# Patient Record
Sex: Female | Born: 1935 | Race: White | Hispanic: No | Smoking: Former smoker
Health system: Southern US, Community
[De-identification: ages and names within clinical notes are randomized; demographics above are authoritative.]

## PROBLEM LIST (undated history)

## (undated) DIAGNOSIS — E785 Hyperlipidemia, unspecified: Secondary | ICD-10-CM

## (undated) DIAGNOSIS — K635 Polyp of colon: Secondary | ICD-10-CM

## (undated) DIAGNOSIS — K219 Gastro-esophageal reflux disease without esophagitis: Secondary | ICD-10-CM

## (undated) DIAGNOSIS — T7840XA Allergy, unspecified, initial encounter: Secondary | ICD-10-CM

## (undated) DIAGNOSIS — C801 Malignant (primary) neoplasm, unspecified: Secondary | ICD-10-CM

## (undated) DIAGNOSIS — I10 Essential (primary) hypertension: Secondary | ICD-10-CM

## (undated) DIAGNOSIS — R42 Dizziness and giddiness: Secondary | ICD-10-CM

## (undated) HISTORY — DX: Gastro-esophageal reflux disease without esophagitis: K21.9

## (undated) HISTORY — DX: Polyp of colon: K63.5

## (undated) HISTORY — PX: VAGINAL HYSTERECTOMY: SUR661

## (undated) HISTORY — PX: CHOLECYSTECTOMY: SHX55

## (undated) HISTORY — DX: Malignant (primary) neoplasm, unspecified: C80.1

## (undated) HISTORY — DX: Essential (primary) hypertension: I10

## (undated) HISTORY — DX: Hyperlipidemia, unspecified: E78.5

## (undated) HISTORY — PX: BLADDER REPAIR: SHX76

## (undated) HISTORY — DX: Allergy, unspecified, initial encounter: T78.40XA

## (undated) HISTORY — PX: APPENDECTOMY: SHX54

## (undated) HISTORY — PX: EYE SURGERY: SHX253

## (undated) HISTORY — DX: Dizziness and giddiness: R42

---

## 2000-09-11 LAB — HM DEXA SCAN

## 2004-03-22 ENCOUNTER — Other Ambulatory Visit: Payer: Self-pay

## 2005-02-24 ENCOUNTER — Ambulatory Visit: Payer: Self-pay | Admitting: Ophthalmology

## 2005-03-01 ENCOUNTER — Ambulatory Visit: Payer: Self-pay | Admitting: Ophthalmology

## 2005-03-24 ENCOUNTER — Ambulatory Visit: Payer: Self-pay | Admitting: Ophthalmology

## 2005-03-28 ENCOUNTER — Ambulatory Visit: Payer: Self-pay | Admitting: Ophthalmology

## 2005-05-09 ENCOUNTER — Ambulatory Visit: Payer: Self-pay | Admitting: Internal Medicine

## 2005-06-19 ENCOUNTER — Encounter: Payer: Self-pay | Admitting: Otolaryngology

## 2005-07-12 ENCOUNTER — Encounter: Payer: Self-pay | Admitting: Otolaryngology

## 2005-10-10 ENCOUNTER — Emergency Department: Payer: Self-pay | Admitting: Emergency Medicine

## 2005-11-10 ENCOUNTER — Ambulatory Visit: Payer: Self-pay | Admitting: Internal Medicine

## 2006-04-27 ENCOUNTER — Ambulatory Visit: Payer: Self-pay | Admitting: Unknown Physician Specialty

## 2006-05-24 ENCOUNTER — Ambulatory Visit: Payer: Self-pay | Admitting: Unknown Physician Specialty

## 2006-11-29 ENCOUNTER — Ambulatory Visit: Payer: Self-pay | Admitting: Surgery

## 2006-12-17 ENCOUNTER — Ambulatory Visit: Payer: Self-pay | Admitting: Surgery

## 2006-12-31 ENCOUNTER — Inpatient Hospital Stay: Payer: Self-pay | Admitting: Surgery

## 2007-01-10 HISTORY — PX: OTHER SURGICAL HISTORY: SHX169

## 2007-07-02 ENCOUNTER — Ambulatory Visit: Payer: Self-pay | Admitting: Unknown Physician Specialty

## 2007-11-20 ENCOUNTER — Ambulatory Visit: Payer: Self-pay | Admitting: Surgery

## 2007-11-28 ENCOUNTER — Ambulatory Visit: Payer: Self-pay | Admitting: Surgery

## 2008-05-13 ENCOUNTER — Encounter: Payer: Self-pay | Admitting: Internal Medicine

## 2008-06-11 ENCOUNTER — Encounter: Payer: Self-pay | Admitting: Internal Medicine

## 2008-10-08 ENCOUNTER — Ambulatory Visit: Payer: Self-pay | Admitting: Unknown Physician Specialty

## 2008-10-15 ENCOUNTER — Ambulatory Visit: Payer: Self-pay

## 2008-10-24 IMAGING — CT CT ABDOMEN W/ CM
2 of 4 series · 13 of 32 positions shown, 19 images · non-contrast
Comparison: none

REASON FOR EXAM: Recurrent Paraesophageal Hernia Reflux pain
COMMENTS:

PROCEDURE:     CT  - CT ABDOMEN STANDARD W  - November 28, 2007 [DATE]
RESULT:
HISTORY: Lung cancer, prior thoracotomy, prior cholecystectomy and
appendectomy.
COMPARISON STUDIES:  None.

[Series 2: abdomen · axial · 0.71mm/px · z∈[+200,+408]mm · 10 of 32 slices shown, 16 images (1 of 2)]
[im 3/32  soft-tissue]
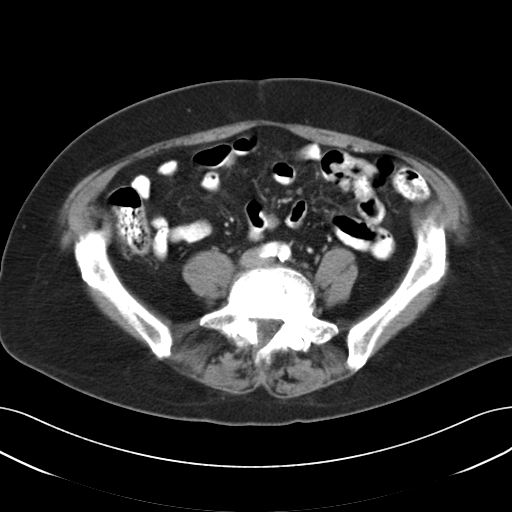
[im 3/32  bone]
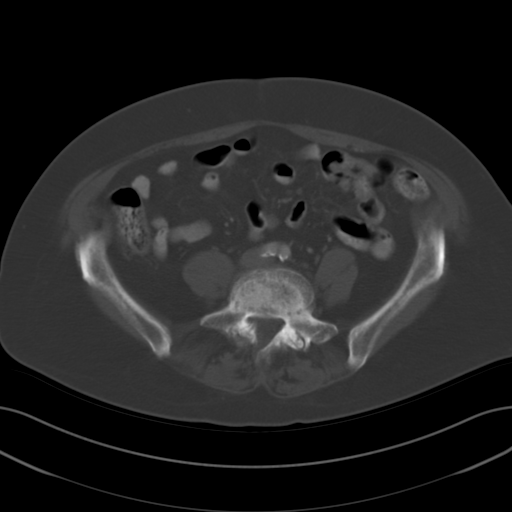
[im 6/32  soft-tissue]
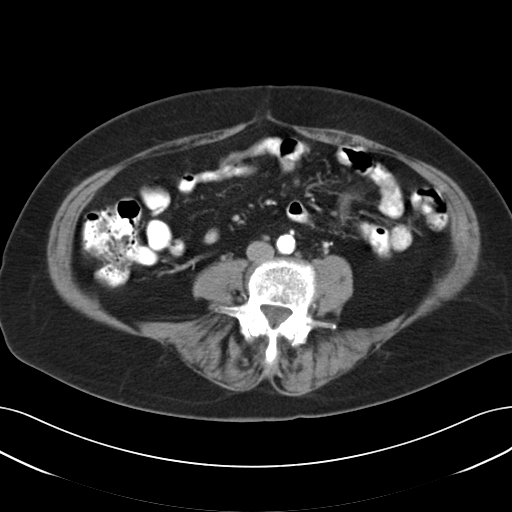
[im 9/32  soft-tissue]
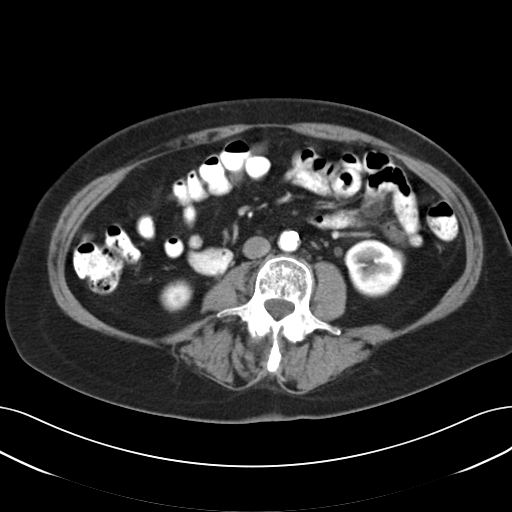
[im 12/32  soft-tissue]
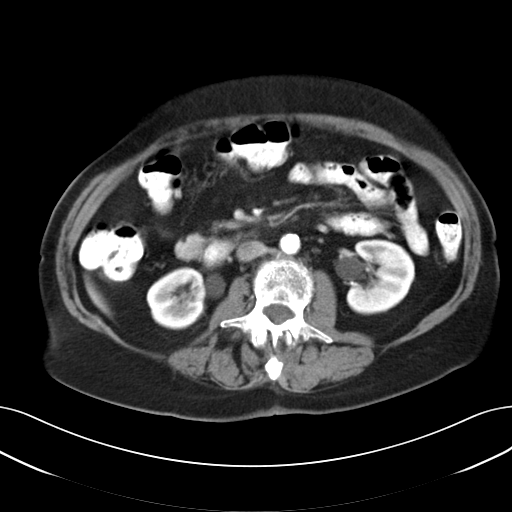
[im 15/32  soft-tissue]
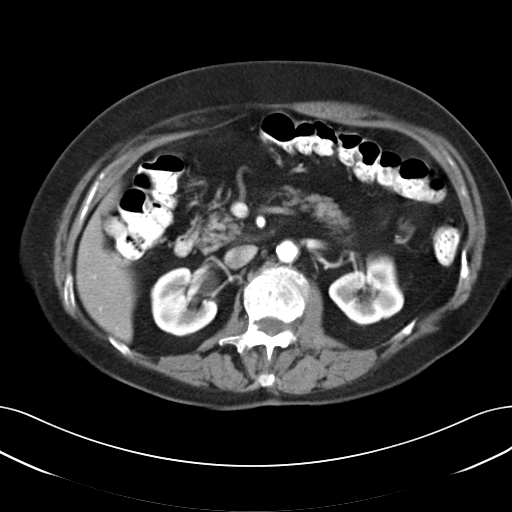
[im 17/32  soft-tissue]
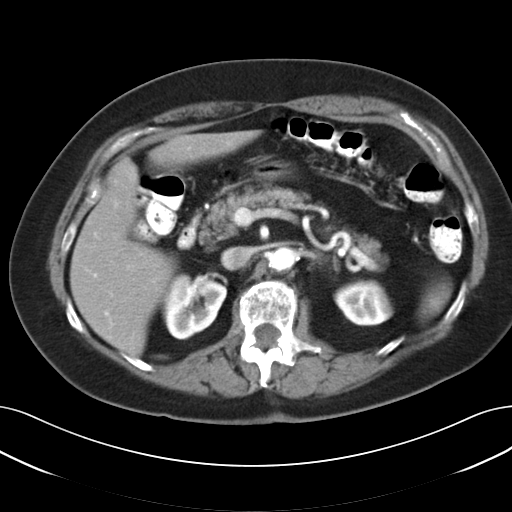
[im 20/32  soft-tissue]
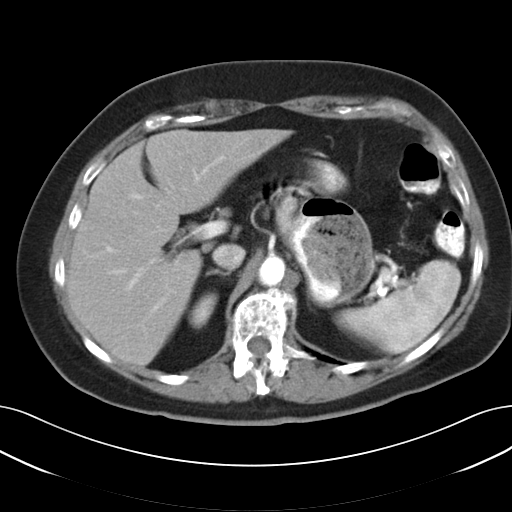
[im 20/32  lung]
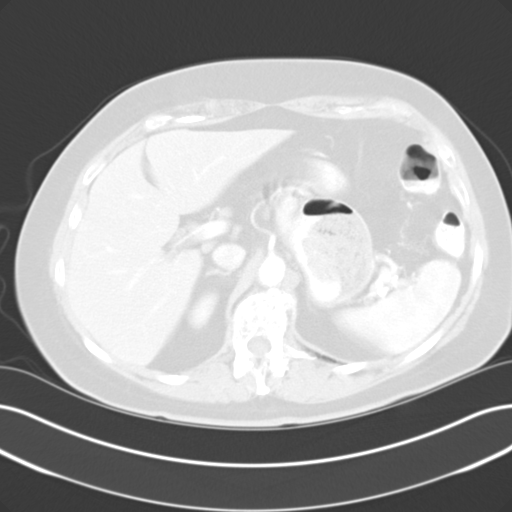
[im 23/32  soft-tissue]
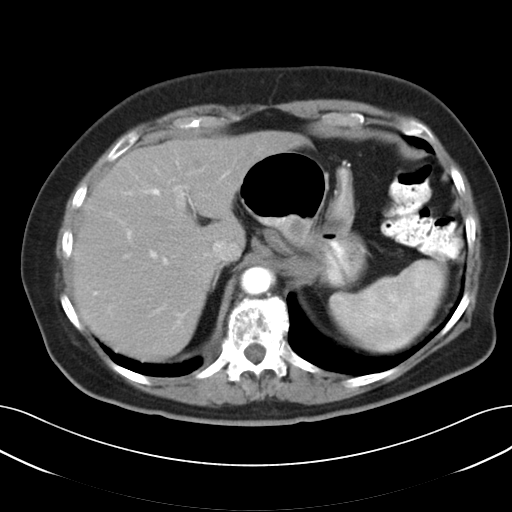
[im 23/32  lung]
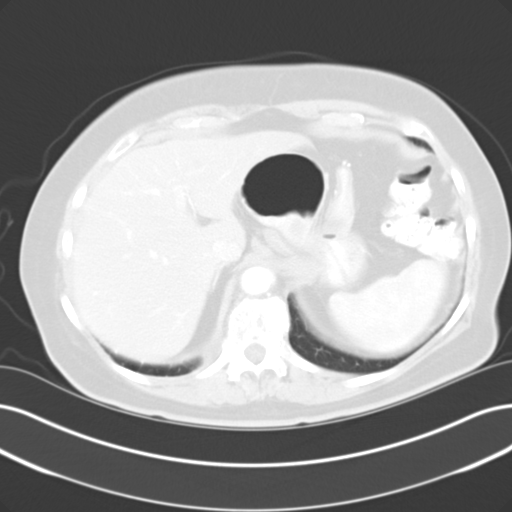
[im 26/32  soft-tissue]
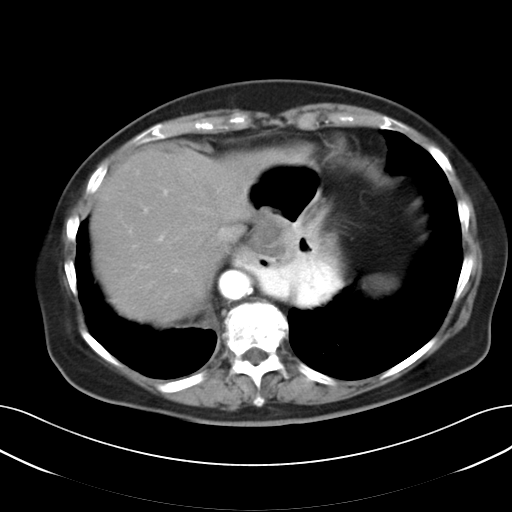
[im 26/32  lung]
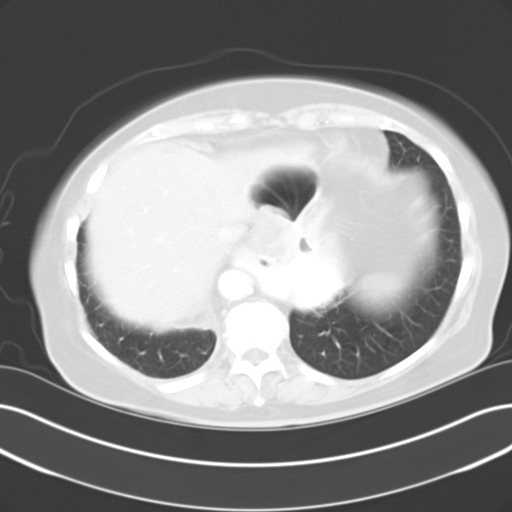
[im 26/32  bone]
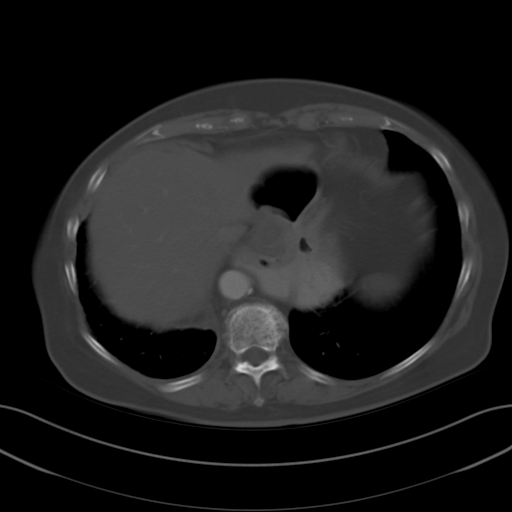
[im 29/32  soft-tissue]
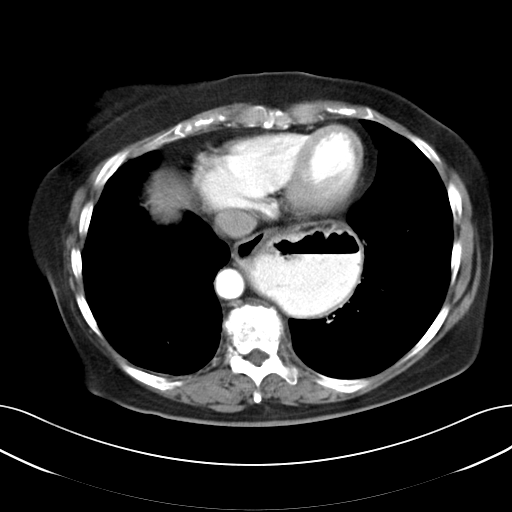
[im 29/32  lung]
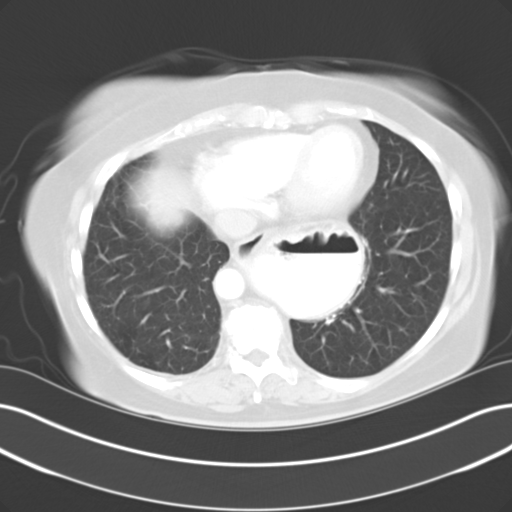

[Series 4: abdomen · axial · 0.71mm/px · z∈[+324,+372]mm · 3 of 23 slices shown (2 of 2)]
[im 4/23  soft-tissue]
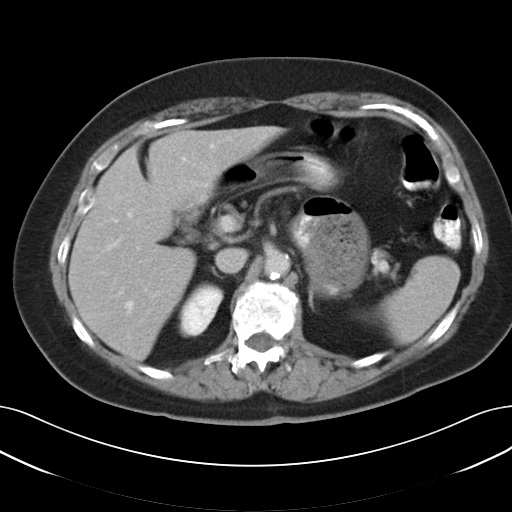
[im 7/23  soft-tissue]
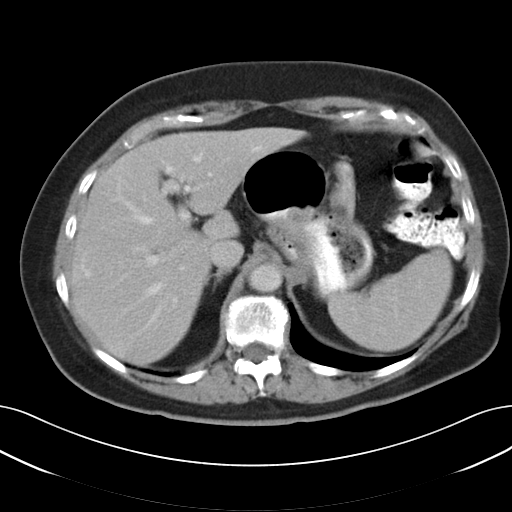
[im 10/23  soft-tissue]
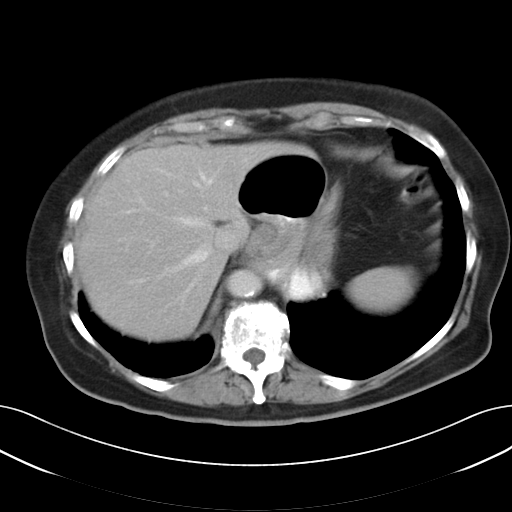

[13 of 32 positions shown; findings below may reference images not displayed]

FINDINGS: Standard CT of the abdomen and pelvis was obtained.  There is a
large sliding hiatal hernia present. The liver and spleen are unremarkable.
The hepatic veins and portal veins are patent. The pancreas is normal.  Mild
bilateral hydronephrosis versus extrarenal pelves is noted. A renal
ultrasound is suggested for further evaluation. Aortoiliac atherosclerotic
vascular disease is present. No bowel obstruction is noted.  No free air is
noted.  A large hiatal hernia is present. Reference is made to recent barium
swallow which demonstrated a large paraesophageal hiatal hernia.
IMPRESSION: 1.     Large hiatal hernia. Reference is made to recent barium swallow which
demonstrates a large paraesophageal hiatal hernia.
2.     Mild bilateral hydronephrosis versus extrarenal pelves.  Renal
ultrasound is suggested for further evaluation.

T

## 2008-11-16 ENCOUNTER — Ambulatory Visit: Payer: Self-pay | Admitting: Unknown Physician Specialty

## 2009-02-01 ENCOUNTER — Inpatient Hospital Stay (HOSPITAL_COMMUNITY): Admission: RE | Admit: 2009-02-01 | Discharge: 2009-02-04 | Payer: Self-pay | Admitting: Surgery

## 2009-02-01 ENCOUNTER — Encounter (INDEPENDENT_AMBULATORY_CARE_PROVIDER_SITE_OTHER): Payer: Self-pay | Admitting: Surgery

## 2009-08-10 ENCOUNTER — Ambulatory Visit: Payer: Self-pay

## 2009-11-15 ENCOUNTER — Ambulatory Visit: Payer: Self-pay | Admitting: General Practice

## 2009-12-15 ENCOUNTER — Ambulatory Visit: Payer: Self-pay | Admitting: Internal Medicine

## 2009-12-15 ENCOUNTER — Ambulatory Visit: Payer: Self-pay | Admitting: Unknown Physician Specialty

## 2010-01-24 ENCOUNTER — Ambulatory Visit: Payer: Self-pay | Admitting: General Practice

## 2010-02-09 ENCOUNTER — Inpatient Hospital Stay: Payer: Self-pay | Admitting: General Practice

## 2010-02-09 ENCOUNTER — Encounter: Payer: Self-pay | Admitting: Internal Medicine

## 2010-02-09 HISTORY — PX: JOINT REPLACEMENT: SHX530

## 2010-02-15 ENCOUNTER — Encounter: Payer: Self-pay | Admitting: Internal Medicine

## 2010-02-17 ENCOUNTER — Ambulatory Visit: Payer: Self-pay | Admitting: Internal Medicine

## 2010-02-18 DIAGNOSIS — J45909 Unspecified asthma, uncomplicated: Secondary | ICD-10-CM | POA: Insufficient documentation

## 2010-02-18 DIAGNOSIS — Z860100 Personal history of colon polyps, unspecified: Secondary | ICD-10-CM | POA: Insufficient documentation

## 2010-02-18 DIAGNOSIS — J309 Allergic rhinitis, unspecified: Secondary | ICD-10-CM | POA: Insufficient documentation

## 2010-02-18 DIAGNOSIS — E785 Hyperlipidemia, unspecified: Secondary | ICD-10-CM | POA: Insufficient documentation

## 2010-02-18 DIAGNOSIS — Z85118 Personal history of other malignant neoplasm of bronchus and lung: Secondary | ICD-10-CM | POA: Insufficient documentation

## 2010-02-18 DIAGNOSIS — N3941 Urge incontinence: Secondary | ICD-10-CM | POA: Insufficient documentation

## 2010-02-18 DIAGNOSIS — J4520 Mild intermittent asthma, uncomplicated: Secondary | ICD-10-CM | POA: Insufficient documentation

## 2010-02-18 DIAGNOSIS — I10 Essential (primary) hypertension: Secondary | ICD-10-CM | POA: Insufficient documentation

## 2010-02-18 DIAGNOSIS — K219 Gastro-esophageal reflux disease without esophagitis: Secondary | ICD-10-CM | POA: Insufficient documentation

## 2010-02-18 DIAGNOSIS — M810 Age-related osteoporosis without current pathological fracture: Secondary | ICD-10-CM | POA: Insufficient documentation

## 2010-02-21 ENCOUNTER — Encounter: Payer: Self-pay | Admitting: Internal Medicine

## 2010-03-10 ENCOUNTER — Ambulatory Visit: Payer: Self-pay | Admitting: Internal Medicine

## 2010-03-10 DIAGNOSIS — M1990 Unspecified osteoarthritis, unspecified site: Secondary | ICD-10-CM | POA: Insufficient documentation

## 2010-03-11 ENCOUNTER — Telehealth: Payer: Self-pay | Admitting: Internal Medicine

## 2010-06-30 ENCOUNTER — Ambulatory Visit: Payer: Self-pay | Admitting: Internal Medicine

## 2010-06-30 DIAGNOSIS — G479 Sleep disorder, unspecified: Secondary | ICD-10-CM

## 2010-07-13 ENCOUNTER — Ambulatory Visit: Payer: Self-pay | Admitting: Family Medicine

## 2010-07-13 ENCOUNTER — Encounter: Payer: Self-pay | Admitting: Family Medicine

## 2010-07-13 DIAGNOSIS — L723 Sebaceous cyst: Secondary | ICD-10-CM

## 2010-09-13 ENCOUNTER — Encounter: Payer: Self-pay | Admitting: Family Medicine

## 2010-09-20 ENCOUNTER — Encounter
Admission: RE | Admit: 2010-09-20 | Discharge: 2010-09-20 | Payer: Self-pay | Source: Home / Self Care | Attending: Orthopedic Surgery | Admitting: Orthopedic Surgery

## 2010-10-11 NOTE — Letter (Signed)
Summary: [facility] Admission,Med List,Problem [facility] Discharge Summar  [facility] Admission,Med List,Problem [facility] Discharge Summary   Imported By: Beau Fanny 02/21/2010 14:57:21  _____________________________________________________________________  External Attachment:    Type:   Image     Comment:   External Document

## 2010-10-11 NOTE — Assessment & Plan Note (Signed)
Summary: ROA FOR 4 MONTH FOLLOW-UP/JRR   Vital Signs:  Patient profile:   77 year old female Weight:      128 pounds Temp:     98.1 degrees F oral Pulse rate:   76 / minute Pulse rhythm:   regular BP sitting:   126 / 80  (left arm) Cuff size:   regular  Vitals Entered By: Mervin Hack CMA Duncan Dull) (June 30, 2010 2:35 PM) CC: 4 month follow-up   History of Present Illness: DOing well  Mostly back to normal living Now back to her course at [employer] and attending football games again Drives again Only needed the aides briefly  Now notes the problems in her right hip now--may need to consider having replacement of that hip now Uses 1 tylenol 500mg  in AM Only rarely uses aleve  No problems with esophagus of note Occ gets some symptoms and she is careful with diet and uses occ maalox swallowing okay if she is careful No longer totally lactose intolerant  Breathing has been okay has tried to cut back on symbicort---close to as needed now Has managed without wheezing or SOB  Having some sleep problems lately---occ just thinking about issues with kids, etc Occ awakens early in AM and can't get back to sleep. Tylenol PM no longer helping Not depressed  Doing well on avapro intolerant of a number of other meds wants to try losartan  has cyst on right cheek that is irritating her  Allergies: 1)  * Scopolamine  Past History:  Past medical, surgical, family and social histories (including risk factors) reviewed for relevance to current acute and chronic problems.  Past Medical History: Reviewed history from 03/10/2010 and no changes required. Hypertension Asthma Chronic Vertigo Allergic rhinitis Osteoporosis Colonic polyps, hx of Lung cancer, hx of Urinary incontinence Hyperlipidemia GERD Osteoarthritis  Past Surgical History: Reviewed history from 02/18/2010 and no changes required. Total hip replacement-02/2010 Esophageal Hernia-01/2007, repairs x  four Bladder repair Cataract extraction-Bilateral Hysterectomy Appendectomy Cholecystectomy  Family History: Reviewed history from 02/18/2010 and no changes required. Father died at age 105-Stroke or Pulmonary Embolism Mother died at in her 30's-Staph infection She is an only child  Social History: Reviewed history from 03/10/2010 and no changes required. Occupation: [employer] Retired Widow/Widower:2007 Children: Four sons, One died of brain tumor Alcohol use-yes, occassional wine  Son Barrister's clerk has health care POA. Would accept resuscitation attempts but no prologned ventilation. No feeding tube  Review of Systems       Occ headaches---seemed better after stopping zyrtec and irrigating her nose eating well weight up 4# again since post op No fever    Physical Exam  General:  alert and normal appearance.   Neck:  supple, no masses, no thyromegaly, no carotid bruits, and no cervical lymphadenopathy.   Lungs:  normal respiratory effort, no intercostal retractions, no accessory muscle use, and normal breath sounds.   Heart:  normal rate, regular rhythm, no murmur, and no gallop.   Abdomen:  soft and non-tender.   Extremities:  no edema Skin:  irritated cyst in right cheek Psych:  normally interactive, good eye contact, not anxious appearing, and not depressed appearing.     Impression & Recommendations:  Problem # 1:  SLEEP DISORDER (ICD-780.50) Assessment New will try trazodone as needed   Problem # 2:  HYPERTENSION (ICD-401.9) Assessment: Unchanged will try change to generic losartan  The following medications were removed from the medication list:    Avapro 300 Mg Tabs (  Irbesartan) .Marland Kitchen... Take one tablet by mouth every morning Her updated medication list for this problem includes:    Losartan Potassium 100 Mg Tabs (Losartan potassium) .Marland Kitchen... 1 tab daily for high blood pressure  BP today: 126/80 Prior BP: 156/78 (03/10/2010)  Problem # 3:   OSTEOARTHRITIS (ICD-715.90) Assessment: Improved left hip great now with trouble with right hip---holding off on surgery for now  Her updated medication list for this problem includes:    Acetaminophen 500 Mg Tabs (Acetaminophen) .Marland Kitchen... Take one tablet by mouth every four to six hours as needed for pain    Aspirin Ec 81 Mg Tbec (Aspirin) .Marland Kitchen... Take one tablet by mouth daily  Problem # 4:  ASTHMA (ICD-493.90) Assessment: Unchanged good control  Her updated medication list for this problem includes:    Symbicort 80-4.5 Mcg/act Aero (Budesonide-formoterol fumarate) .Marland Kitchen..Marland Kitchen Two puffs two times a day as needed    Theophylline Cr 200 Mg Xr12h-tab (Theophylline) .Marland Kitchen... Take 1 tablet by mouth once a day  Problem # 5:  GERD (ICD-530.81) Assessment: Unchanged good control  Her updated medication list for this problem includes:    Dexilant 60 Mg Cpdr (Dexlansoprazole) .Marland Kitchen... Take one tablet by mouth daily  Complete Medication List: 1)  Detrol La 4 Mg Xr24h-cap (Tolterodine tartrate) .... Take one by mouth at bedtime 2)  Lipitor 10 Mg Tabs (Atorvastatin calcium) .... Take one tablet by mouth at bedtime 3)  Symbicort 80-4.5 Mcg/act Aero (Budesonide-formoterol fumarate) .... Two puffs two times a day as needed 4)  Nasonex 50 Mcg/act Susp (Mometasone furoate) .... Two sprays each nostril two times a day as needed 5)  Dexilant 60 Mg Cpdr (Dexlansoprazole) .... Take one tablet by mouth daily 6)  Ondansetron Hcl 4 Mg Tabs (Ondansetron hcl) .... Dissolve one talet under the tongue as needed 7)  Theophylline Cr 200 Mg Xr12h-tab (Theophylline) .... Take 1 tablet by mouth once a day 8)  Acetaminophen 500 Mg Tabs (Acetaminophen) .... Take one tablet by mouth every four to six hours as needed for pain 9)  Calcium Citrate + 315-200 Mg-unit Tabs (Calcium citrate-vitamin d) .... Take one tablet by mouth two times a day 10)  Multivitamins Tabs (Multiple vitamin) .... Take one tablet by mouth daily 11)  Saline  Nasal Spray 0.65 % Soln (Saline) .... One spray each nostril every two hours as needed 12)  Zyrtec Allergy 10 Mg Tabs (Cetirizine hcl) .... Take one tablet by mouth at bedtime 13)  Miralax Powd (Polyethylene glycol 3350) .Marland KitchenMarland KitchenMarland Kitchen 17 grams by mouth everyday as needed 14)  Aspirin Ec 81 Mg Tbec (Aspirin) .... Take one tablet by mouth daily 15)  Losartan Potassium 100 Mg Tabs (Losartan potassium) .Marland Kitchen.. 1 tab daily for high blood pressure 16)  Trazodone Hcl 50 Mg Tabs (Trazodone hcl) .Marland Kitchen.. 1-2 tabs by mouth at bedtime as needed to help sleep  Other Orders: Flu Vaccine 21yrs + MEDICARE PATIENTS (Z6109) Administration Flu vaccine - MCR (U0454)  Patient Instructions: 1)  Please schedule 30 minute surgery visit to remove cyst on face Prescriptions: TRAZODONE HCL 50 MG TABS (TRAZODONE HCL) 1-2 tabs by mouth at bedtime as needed to help sleep  #60 x 5   Entered and Authorized by:   Cindee Salt MD   Signed by:   Cindee Salt MD on 06/30/2010   Method used:   Electronically to        [facility]* (retail)       [address]  [place], [place]  [address]       Ph: [phone]       Fax: [phone]   RxID:   [id number] LOSARTAN POTASSIUM 100 MG TABS (LOSARTAN POTASSIUM) 1 tab daily for high blood pressure  #30 x 11   Entered and Authorized by:   Cindee Salt MD   Signed by:   Cindee Salt MD on 06/30/2010   Method used:   Electronically to        [facility]* (retail)       [address]       [place], [place]  [address]       Ph: [phone]       Fax: [phone]   RxID:   [id number]    Orders Added: 1)  Flu Vaccine 26yrs + MEDICARE PATIENTS [Q2039] 2)  Administration Flu vaccine - MCR [G0008] 3)  Est. Patient Level IV [06301]   Immunizations Administered:  Influenza Vaccine # 1:    Vaccine Type: Fluvax MCR    Site: left deltoid    Mfr: Sanofi Pasteur    Dose: 0.5 ml    Route: IM    Given by: Mervin Hack CMA (AAMA)    Exp. Date:  03/11/2011    Lot #: [id number]    VIS given: 04/05/10 version given June 30, 2010.  Flu Vaccine Consent Questions:    Do you have a history of severe allergic reactions to this vaccine? no    Any prior history of allergic reactions to egg and/or gelatin? no    Do you have a sensitivity to the preservative Thimersol? no    Do you have a past history of Guillan-Barre Syndrome? no    Do you currently have an acute febrile illness? no    Have you ever had a severe reaction to latex? no    Vaccine information given and explained to patient? yes    Are you currently pregnant? no   Immunizations Administered:  Influenza Vaccine # 1:    Vaccine Type: Fluvax MCR    Site: left deltoid    Mfr: Sanofi Pasteur    Dose: 0.5 ml    Route: IM    Given by: Mervin Hack CMA (AAMA)    Exp. Date: 03/11/2011    Lot #: [id number]    VIS given: 04/05/10 version given June 30, 2010.  Current Allergies (reviewed today): * SCOPOLAMINE

## 2010-10-11 NOTE — Assessment & Plan Note (Signed)
Summary: [facility] Office Visit   Vital Signs:  Patient profile:   77 year old female Height:      60 inches Weight:      128 pounds  History of Present Illness: 77 yo new to me here to discuss several issues.  1.  Sebaceous cyst on right cheek- had an appointment with Dr. Alphonsus Sias to remove it, she would like to cancel the appointment as it continues to get smaller and smaller.  No longer bothering her.  2.  Insomnia- was given Trazadone at last apppointment.  Took one 50 mg tablet and made her very dizzy and nausea.  Has h/o vertigo. Stopped taking it.  Would like to try low dose ambien to "help slow her mind down at night." OVerall, mood is very good. Happy to be turning 77 yo next month next month.  Very active and misses her husband but knows he is in a better place- strong in her faith.  3.  Hip pain- s/p left hip replacement, scheduling appt for right hip replacement soon. With change of weather, taking more alleve with her tylenol.  No more often than twice a week due to her stomach and esophageal issues.  Current Medications (verified): 1)  Detrol La 4 Mg Xr24h-Cap (Tolterodine Tartrate) .... Take One By Mouth At Bedtime 2)  Lipitor 10 Mg Tabs (Atorvastatin Calcium) .... Take One Tablet By Mouth At Bedtime 3)  Symbicort 80-4.5 Mcg/act Aero (Budesonide-Formoterol Fumarate) .... Two Puffs Two Times A Day As Needed 4)  Nasonex 50 Mcg/act Susp (Mometasone Furoate) .... Two Sprays Each Nostril Two Times A Day As Needed 5)  Dexilant 60 Mg Cpdr (Dexlansoprazole) .... Take One Tablet By Mouth Daily 6)  Ondansetron Hcl 4 Mg Tabs (Ondansetron Hcl) .... Dissolve One Talet Under The Tongue As Needed 7)  Theophylline Cr 200 Mg Xr12h-Tab (Theophylline) .... Take 1 Tablet By Mouth Once A Day 8)  Acetaminophen 500 Mg Tabs (Acetaminophen) .... Take One Tablet By Mouth Every Four To Six Hours As Needed For Pain 9)  Calcium Citrate + 315-200 Mg-Unit Tabs (Calcium Citrate-Vitamin D) .... Take One Tablet  By Mouth Two Times A Day 10)  Multivitamins  Tabs (Multiple Vitamin) .... Take One Tablet By Mouth Daily 11)  Saline Nasal Spray 0.65 % Soln (Saline) .... One Spray Each Nostril Every Two Hours As Needed 12)  Zyrtec Allergy 10 Mg Tabs (Cetirizine Hcl) .... Take One Tablet By Mouth At Bedtime 13)  Miralax  Powd (Polyethylene Glycol 3350) .Marland KitchenMarland KitchenMarland Kitchen 17 Grams By Mouth Everyday As Needed 14)  Aspirin Ec 81 Mg Tbec (Aspirin) .... Take One Tablet By Mouth Daily 15)  Losartan Potassium 100 Mg Tabs (Losartan Potassium) .Marland Kitchen.. 1 Tab Daily For High Blood Pressure 16)  Ambien 5 Mg Tabs (Zolpidem Tartrate) .Marland Kitchen.. 1 Tab By Mouth At Bedtime As Needed Insomnia. 17)  Naprosyn 500 Mg Tabs (Naproxen) .Marland Kitchen.. 1 Two Times A Day Pc Prn  Allergies (verified): 1)  * Scopolamine  Past History:  Past Medical History: Last updated: 03/10/2010 Hypertension Asthma Chronic Vertigo Allergic rhinitis Osteoporosis Colonic polyps, hx of Lung cancer, hx of Urinary incontinence Hyperlipidemia GERD Osteoarthritis  Past Surgical History: Last updated: 03/16/2010 Total hip replacement-02/2010 Esophageal Hernia-01/2007, repairs x four Bladder repair Cataract extraction-Bilateral Hysterectomy Appendectomy Cholecystectomy  Family History: Last updated: 03-16-10 Father died at age 75-Stroke or Pulmonary Embolism Mother died at in her 82's-Staph infection She is an only child  Social History: Last updated: 03/10/2010 Occupation: [employer] Retired Widow/Widower:2007 Children: Four  sons, One died of brain tumor Alcohol use-yes, occassional wine  Son Alto Denver Roberson has health care POA. Would accept resuscitation attempts but no prologned ventilation. No feeding tube  Review of Systems      See HPI General:  Denies fever. Resp:  Denies cough. Psych:  Denies anxiety and depression.  Physical Exam  General:  alert and normal appearance.   Mouth:  MMM Lungs:  normal respiratory effort, no intercostal  retractions, no accessory muscle use, and normal breath sounds.   Heart:  normal rate, regular rhythm, no murmur, and no gallop.   Skin:  small cyst on right cheek, no erythema, nontender to palp Psych:  normally interactive, good eye contact, not anxious appearing, and not depressed appearing.     Impression & Recommendations:  Problem # 1:  SEBACEOUS CYST (ICD-706.2) Assessment Improved cancelled appt with Dr. Alphonsus Sias to remove it.  Problem # 2:  SLEEP DISORDER (ICD-780.50) Assessment: Deteriorated d/c trazadone start very low dose ambien- discussed possible side effects.  Problem # 3:  OSTEOARTHRITIS (ICD-715.90) Assessment: Deteriorated hopes to have hip replacement in the next month or so. Continue Tylenol and Alleve as needed. Her updated medication list for this problem includes:    Acetaminophen 500 Mg Tabs (Acetaminophen) .Marland Kitchen... Take one tablet by mouth every four to six hours as needed for pain    Aspirin Ec 81 Mg Tbec (Aspirin) .Marland Kitchen... Take one tablet by mouth daily    Naprosyn 500 Mg Tabs (Naproxen) .Marland Kitchen... 1 two times a day pc prn  Complete Medication List: 1)  Detrol La 4 Mg Xr24h-cap (Tolterodine tartrate) .... Take one by mouth at bedtime 2)  Lipitor 10 Mg Tabs (Atorvastatin calcium) .... Take one tablet by mouth at bedtime 3)  Symbicort 80-4.5 Mcg/act Aero (Budesonide-formoterol fumarate) .... Two puffs two times a day as needed 4)  Nasonex 50 Mcg/act Susp (Mometasone furoate) .... Two sprays each nostril two times a day as needed 5)  Dexilant 60 Mg Cpdr (Dexlansoprazole) .... Take one tablet by mouth daily 6)  Ondansetron Hcl 4 Mg Tabs (Ondansetron hcl) .... Dissolve one talet under the tongue as needed 7)  Theophylline Cr 200 Mg Xr12h-tab (Theophylline) .... Take 1 tablet by mouth once a day 8)  Acetaminophen 500 Mg Tabs (Acetaminophen) .... Take one tablet by mouth every four to six hours as needed for pain 9)  Calcium Citrate + 315-200 Mg-unit Tabs (Calcium  citrate-vitamin d) .... Take one tablet by mouth two times a day 10)  Multivitamins Tabs (Multiple vitamin) .... Take one tablet by mouth daily 11)  Saline Nasal Spray 0.65 % Soln (Saline) .... One spray each nostril every two hours as needed 12)  Zyrtec Allergy 10 Mg Tabs (Cetirizine hcl) .... Take one tablet by mouth at bedtime 13)  Miralax Powd (Polyethylene glycol 3350) .Marland KitchenMarland KitchenMarland Kitchen 17 grams by mouth everyday as needed 14)  Aspirin Ec 81 Mg Tbec (Aspirin) .... Take one tablet by mouth daily 15)  Losartan Potassium 100 Mg Tabs (Losartan potassium) .Marland Kitchen.. 1 tab daily for high blood pressure 16)  Ambien 5 Mg Tabs (Zolpidem tartrate) .Marland Kitchen.. 1 tab by mouth at bedtime as needed insomnia. 17)  Naprosyn 500 Mg Tabs (Naproxen) .Marland Kitchen.. 1 two times a day pc prn Prescriptions: AMBIEN 5 MG TABS (ZOLPIDEM TARTRATE) 1 tab by mouth at bedtime as needed insomnia.  #30 x 0   Entered and Authorized by:   Ruthe Mannan MD   Signed by:   Ruthe Mannan MD on 07/13/2010  Method used:   Telephoned to ...       [facility]* (retail)       [address]       [place], [place]  [address]       Ph: [phone]       Fax: [phone]   RxID:   [id number]

## 2010-10-11 NOTE — Progress Notes (Signed)
Summary: regarding detrol  Phone Note From Pharmacy   Caller: [facility] Summary of Call: Script was sent in for detrol LA 2 mg, but pharmacy says pt has always been on 4 mg's.  They are asking if you are changing the dose.  Note is on your desk. Initial call taken by: Lowella Petties CMA,  March 11, 2010 11:04 AM  Follow-up for Phone Call        please change the Rx to 4mg  I had her listed as 2mg  but am not changing her usual dose Follow-up by: Cindee Salt MD,  March 11, 2010 11:43 AM  Additional Follow-up for Phone Call Additional follow up Details #1::        Called change to [facility], changed dose in emr. Additional Follow-up by: Lowella Petties CMA,  March 11, 2010 12:12 PM    New/Updated Medications: DETROL LA 4 MG XR24H-CAP (TOLTERODINE TARTRATE) take one by mouth at bedtime  Prior Medications: AVAPRO 300 MG TABS (IRBESARTAN) take one tablet by mouth every morning ACETAMINOPHEN 500 MG TABS (ACETAMINOPHEN) take one tablet by mouth every four to six hours as needed for pain CALCIUM CITRATE + 315-200 MG-UNIT TABS (CALCIUM CITRATE-VITAMIN D) take one tablet by mouth two times a day DETROL LA 4 MG XR24H-CAP (TOLTERODINE TARTRATE) take one by mouth at bedtime LIPITOR 10 MG TABS (ATORVASTATIN CALCIUM) take one tablet by mouth at bedtime MULTIVITAMINS  TABS (MULTIPLE VITAMIN) take one tablet by mouth daily SYMBICORT 80-4.5 MCG/ACT AERO (BUDESONIDE-FORMOTEROL FUMARATE) two puffs two times a day as needed SALINE NASAL SPRAY 0.65 % SOLN (SALINE) one spray each nostril every two hours as needed NASONEX 50 MCG/ACT SUSP (MOMETASONE FUROATE) two sprays each nostril two times a day as needed MIRALAX  POWD (POLYETHYLENE GLYCOL 3350) 17 grams by mouth everyday as needed ZYRTEC ALLERGY 10 MG TABS (CETIRIZINE HCL) take one tablet by mouth at bedtime DEXILANT 60 MG CPDR (DEXLANSOPRAZOLE) take one tablet by mouth daily ASPIRIN EC 81 MG TBEC (ASPIRIN) take one tablet by mouth daily ONDANSETRON HCL  4 MG TABS (ONDANSETRON HCL) dissolve one talet under the tongue as needed THEOPHYLLINE CR 200 MG XR12H-TAB (THEOPHYLLINE) Take 1 tablet by mouth once a day Current Allergies: * SCOPOLAMINE

## 2010-10-11 NOTE — Letter (Signed)
Summary: [facility] Face Sheet  [facility] Face Sheet   Imported By: Beau Fanny 02/17/2010 14:43:21  _____________________________________________________________________  External Attachment:    Type:   Image     Comment:   External Document

## 2010-10-11 NOTE — Assessment & Plan Note (Signed)
Summary: follow up per dr. Alphonsus Sias   Vital Signs:  Patient profile:   77 year old female Height:      60 inches Weight:      123.25 pounds BMI:     24.16 Temp:     98 degrees F Pulse rate:   84 / minute BP sitting:   156 / 78  (left arm) Cuff size:   regular  Vitals Entered By: Lewanda Rife LPN (March 10, 2010 4:13 PM) CC: follow-up visit   History of Present Illness: Here for first follow up I saw her first in [place] when she was there for rehab following hip replacement  Has done well back at home Really had had enough of being in hospital and rehab did leave fairly quickly but was walking and independent in ADLs Has had aide for 2 hours per day Daughter checking on her regularly Felt better with in 48 hours---felt that her swallowing and asthma had worsened in health care and this did improve back at home  Breathing is okay no cough  swallowing is back to normal eating fine now  Pain in hip is better got off narcotics, then toradol, then tylenol--now not taking anything has finished with OT still getting help from PT Now walking with cane able to drive again has folllow up with Dr Ernest Pine in 2 weeks Still uses aleve two times a day to allow her to do her aggressive PT home regimen  Allergies: 1)  * Scopolamine  Past History:  Past medical, surgical, family and social histories (including risk factors) reviewed for relevance to current acute and chronic problems.  Past Medical History: Hypertension Asthma Chronic Vertigo Allergic rhinitis Osteoporosis Colonic polyps, hx of Lung cancer, hx of Urinary incontinence Hyperlipidemia GERD Osteoarthritis  Past Surgical History: Reviewed history from 02/18/2010 and no changes required. Total hip replacement-02/2010 Esophageal Hernia-01/2007, repairs x four Bladder repair Cataract extraction-Bilateral Hysterectomy Appendectomy Cholecystectomy  Family History: Reviewed history from 02/18/2010 and no  changes required. Father died at age 69-Stroke or Pulmonary Embolism Mother died at in her 61's-Staph infection She is an only child  Social History: Occupation: Advertising account planner Retired Widow/Widower:2007 Children: Four sons, One died of brain tumor Alcohol use-yes, occassional wine  Son Barrister's clerk has health care POA. Would accept resuscitation attempts but no prologned ventilation. No feeding tube  Review of Systems       weight is reasonably stable from before the surgery sleeping is still off somewhat---awakens at 4-5AM but tries to stay in bed  Physical Exam  General:  alert and normal appearance.   Neck:  supple, no masses, no thyromegaly, and no cervical lymphadenopathy.   Lungs:  normal respiratory effort and normal breath sounds.   Heart:  normal rate, regular rhythm, no murmur, and no gallop.   Abdomen:  soft and non-tender.   Extremities:  no edema Neurologic:  walks well with or without the cane Psych:  normally interactive, good eye contact, not anxious appearing, and not depressed appearing.     Impression & Recommendations:  Problem # 1:  OSTEOARTHRITIS (ICD-715.90) Assessment Comment Only has rehabbed weill from [facility] uses aleve and her exercises aggressively  The following medications were removed from the medication list:    Tramadol Hcl 50 Mg Tabs (Tramadol hcl) .Marland Kitchen... Take one tablet every four to six hours as needed for pain    Oxycodone-acetaminophen 5-500 Mg Caps (Oxycodone-acetaminophen) .Marland Kitchen... Take one tablet by mouth every four to six hours as needed for  pain Her updated medication list for this problem includes:    Acetaminophen 500 Mg Tabs (Acetaminophen) .Marland Kitchen... Take one tablet by mouth every four to six hours as needed for pain    Aspirin Ec 81 Mg Tbec (Aspirin) .Marland Kitchen... Take one tablet by mouth daily  Problem # 2:  GERD (ICD-530.81) Assessment: Improved swallowing is better now since going home  Her updated medication list for this problem  includes:    Dexilant 60 Mg Cpdr (Dexlansoprazole) .Marland Kitchen... Take one tablet by mouth daily  Problem # 3:  ASTHMA (ICD-493.90) Assessment: Unchanged stable resp status  Her updated medication list for this problem includes:    Symbicort 80-4.5 Mcg/act Aero (Budesonide-formoterol fumarate) .Marland Kitchen..Marland Kitchen Two puffs two times a day as needed    Theophylline Cr 200 Mg Xr12h-tab (Theophylline) .Marland Kitchen... Take 1 tablet by mouth once a day  Problem # 4:  HYPERTENSION (ICD-401.9) Assessment: Unchanged reasonable control no changes needed  Her updated medication list for this problem includes:    Avapro 300 Mg Tabs (Irbesartan) .Marland Kitchen... Take one tablet by mouth every morning  BP today: 156/78  Complete Medication List: 1)  Avapro 300 Mg Tabs (Irbesartan) .... Take one tablet by mouth every morning 2)  Acetaminophen 500 Mg Tabs (Acetaminophen) .... Take one tablet by mouth every four to six hours as needed for pain 3)  Calcium Citrate + 315-200 Mg-unit Tabs (Calcium citrate-vitamin d) .... Take one tablet by mouth two times a day 4)  Detrol La 2 Mg Xr24h-cap (Tolterodine tartrate) .... Take one tablet by mouth at bedtime 5)  Lipitor 10 Mg Tabs (Atorvastatin calcium) .... Take one tablet by mouth at bedtime 6)  Multivitamins Tabs (Multiple vitamin) .... Take one tablet by mouth daily 7)  Symbicort 80-4.5 Mcg/act Aero (Budesonide-formoterol fumarate) .... Two puffs two times a day as needed 8)  Saline Nasal Spray 0.65 % Soln (Saline) .... One spray each nostril every two hours as needed 9)  Nasonex 50 Mcg/act Susp (Mometasone furoate) .... Two sprays each nostril two times a day as needed 10)  Miralax Powd (Polyethylene glycol 3350) .Marland KitchenMarland KitchenMarland Kitchen 17 grams by mouth everyday as needed 11)  Zyrtec Allergy 10 Mg Tabs (Cetirizine hcl) .... Take one tablet by mouth at bedtime 12)  Dexilant 60 Mg Cpdr (Dexlansoprazole) .... Take one tablet by mouth daily 13)  Aspirin Ec 81 Mg Tbec (Aspirin) .... Take one tablet by mouth daily 14)   Ondansetron Hcl 4 Mg Tabs (Ondansetron hcl) .... Dissolve one talet under the tongue as needed 15)  Theophylline Cr 200 Mg Xr12h-tab (Theophylline) .... Take 1 tablet by mouth once a day  Patient Instructions: 1)  Please schedule a follow-up appointment in 4 months .  Prescriptions: ZYRTEC ALLERGY 10 MG TABS (CETIRIZINE HCL) take one tablet by mouth at bedtime  #30 x 12   Entered and Authorized by:   Cindee Salt MD   Signed by:   Cindee Salt MD on 03/10/2010   Method used:   Electronically to        [facility]* (retail)       [address]       [place], [place]  [address]       Ph: [phone]       Fax: [phone]   RxID:   [id number] THEOPHYLLINE CR 200 MG XR12H-TAB (THEOPHYLLINE) Take 1 tablet by mouth once a day  #30 x 12   Entered and Authorized by:   Cindee Salt MD   Signed by:  Cindee Salt MD on 03/10/2010   Method used:   Electronically to        [facility]* (retail)       [address]       [place], [place]  [address]       Ph: [phone]       Fax: [phone]   RxID:   [id number] ONDANSETRON HCL 4 MG TABS (ONDANSETRON HCL) dissolve one talet under the tongue as needed  #30 x 3   Entered and Authorized by:   Cindee Salt MD   Signed by:   Cindee Salt MD on 03/10/2010   Method used:   Electronically to        [facility]* (retail)       [address]       [place], [place]  [address]       Ph: [phone]       Fax: [phone]   RxID:   [id number] DEXILANT 60 MG CPDR (DEXLANSOPRAZOLE) take one tablet by mouth daily  #30 x 12   Entered and Authorized by:   Cindee Salt MD   Signed by:   Cindee Salt MD on 03/10/2010   Method used:   Electronically to        [facility]* (retail)       [address]       [place], [place]  [address]       Ph: [phone]       Fax: [phone]   RxID:   [id number] NASONEX 50 MCG/ACT SUSP (MOMETASONE FUROATE) two sprays each nostril two  times a day as needed  #1 x 12   Entered and Authorized by:   Cindee Salt MD   Signed by:   Cindee Salt MD on 03/10/2010   Method used:   Electronically to        [facility]* (retail)       [address]       [place], [place]  [address]       Ph: [phone]       Fax: [phone]   RxID:   [id number] SYMBICORT 80-4.5 MCG/ACT AERO (BUDESONIDE-FORMOTEROL FUMARATE) two puffs two times a day as needed  #1 x 12   Entered and Authorized by:   Cindee Salt MD   Signed by:   Cindee Salt MD on 03/10/2010   Method used:   Electronically to        [facility]* (retail)       [address]       [place], [place]  [address]       Ph: [phone]       Fax: [phone]   RxID:   [id number] LIPITOR 10 MG TABS (ATORVASTATIN CALCIUM) take one tablet by mouth at bedtime  #30 x 12   Entered and Authorized by:   Cindee Salt MD   Signed by:   Cindee Salt MD on 03/10/2010   Method used:   Electronically to        [facility]* (retail)       [address]       [place], [place]  [address]       Ph: [phone]       Fax: [phone]   RxID:   [id number] DETROL LA 2 MG XR24H-CAP (TOLTERODINE TARTRATE) take one tablet by mouth at bedtime  #30 x 12   Entered and Authorized by:   Cindee Salt MD  Signed by:   Cindee Salt MD on 03/10/2010   Method used:   Electronically to        [facility]* (retail)       [address]       [place], [place]  [address]       Ph: [phone]       Fax: [phone]   RxID:   [id number] AVAPRO 300 MG TABS (IRBESARTAN) take one tablet by mouth every morning  #30 x 12   Entered and Authorized by:   Cindee Salt MD   Signed by:   Cindee Salt MD on 03/10/2010   Method used:   Electronically to        [facility]* (retail)       [address]       [place], [place]  [address]       Ph: [phone]       Fax: [phone]   RxID:   [id number]   Current  Allergies (reviewed today): * SCOPOLAMINE

## 2010-10-12 ENCOUNTER — Encounter: Payer: Self-pay | Admitting: Family Medicine

## 2010-10-12 DIAGNOSIS — M549 Dorsalgia, unspecified: Secondary | ICD-10-CM | POA: Insufficient documentation

## 2010-10-13 ENCOUNTER — Telehealth: Payer: Self-pay | Admitting: Family Medicine

## 2010-10-13 NOTE — Assessment & Plan Note (Signed)
Summary: [facility] Office Visit- dizziness   Vital Signs:  Patient profile:   77 year old female Height:      60 inches Weight:      128 pounds BP sitting:   126 / 78  History of Present Illness: 77 yo here to discuss multiple issues.  She is very upset. Inittially made appointment for tingling and weakness in her bottom, extremities and itching. I pre ordered labs- BMET, CBC, TSH due to her complaints.  Labs reviewed- were all normal but she had a very difficult time with the process.  Was told to fast, then there was no one there to draw her labs.  Due to fasting for two days, had worsening of her esophageal issues, did not take her dexilant.  Tingling is mainly in her right buttocks when she stands up. Had her left hip replaced 7 months ago, has appt with ortho today to discuss replacement of right hip.  No dizziness, weakness, SOB, or CP.    Itching has resolved with zyrtec.  There was no rash.  No changes in her medications or detergents/soaps.  Wants to go over her med list to discuss what she needs to take while she is in the hospital getting her hip replacement.    Current Medications (verified): 1)  Detrol La 4 Mg Xr24h-Cap (Tolterodine Tartrate) .... Take One By Mouth At Bedtime 2)  Lipitor 10 Mg Tabs (Atorvastatin Calcium) .... Take One Tablet By Mouth At Bedtime 3)  Symbicort 80-4.5 Mcg/act Aero (Budesonide-Formoterol Fumarate) .... Two Puffs Two Times A Day As Needed 4)  Nasonex 50 Mcg/act Susp (Mometasone Furoate) .... Two Sprays Each Nostril Two Times A Day As Needed 5)  Dexilant 60 Mg Cpdr (Dexlansoprazole) .... Take One Tablet By Mouth Daily 6)  Ondansetron Hcl 4 Mg Tabs (Ondansetron Hcl) .... Dissolve One Talet Under The Tongue As Needed 7)  Theophylline Cr 200 Mg Xr12h-Tab (Theophylline) .... Take 1 Tablet By Mouth Once A Day 8)  Acetaminophen 500 Mg Tabs (Acetaminophen) .... Take One Tablet By Mouth Every Four To Six Hours As Needed For Pain 9)  Calcium  Citrate + 315-200 Mg-Unit Tabs (Calcium Citrate-Vitamin D) .... Take One Tablet By Mouth Two Times A Day 10)  Multivitamins  Tabs (Multiple Vitamin) .... Take One Tablet By Mouth Daily 11)  Saline Nasal Spray 0.65 % Soln (Saline) .... One Spray Each Nostril Every Two Hours As Needed 12)  Zyrtec Allergy 10 Mg Tabs (Cetirizine Hcl) .... Take One Tablet By Mouth At Bedtime 13)  Miralax  Powd (Polyethylene Glycol 3350) .Marland KitchenMarland KitchenMarland Kitchen 17 Grams By Mouth Everyday As Needed 14)  Aspirin Ec 81 Mg Tbec (Aspirin) .... Take One Tablet By Mouth Daily 15)  Losartan Potassium 100 Mg Tabs (Losartan Potassium) .Marland Kitchen.. 1 Tab Daily For High Blood Pressure 16)  Ambien 5 Mg Tabs (Zolpidem Tartrate) .Marland Kitchen.. 1 Tab By Mouth At Bedtime As Needed Insomnia. 17)  Naprosyn 500 Mg Tabs (Naproxen) .Marland Kitchen.. 1 Two Times A Day Pc Prn  Allergies (verified): 1)  * Scopolamine  Past History:  Past Medical History: Last updated: 03/10/2010 Hypertension Asthma Chronic Vertigo Allergic rhinitis Osteoporosis Colonic polyps, hx of Lung cancer, hx of Urinary incontinence Hyperlipidemia GERD Osteoarthritis  Past Surgical History: Last updated: February 27, 2010 Total hip replacement-02/2010 Esophageal Hernia-01/2007, repairs x four Bladder repair Cataract extraction-Bilateral Hysterectomy Appendectomy Cholecystectomy  Family History: Last updated: February 27, 2010 Father died at age 42-Stroke or Pulmonary Embolism Mother died at in her 30's-Staph infection She is an only child  Social History: Last updated: 03/10/2010 Occupation: [employer] Retired Widow/Widower:2007 Children: Four sons, One died of brain tumor Alcohol use-yes, occassional wine  Son Barrister's clerk has health care POA. Would accept resuscitation attempts but no prologned ventilation. No feeding tube  Review of Systems      See HPI General:  Denies malaise. Eyes:  Denies blurring. GI:  Denies abdominal pain and bloody stools.  Physical Exam  General:  alert  and normal appearance.   Psych:  normally interactive, good eye contact, not anxious appearing, and not depressed appearing.     Impression & Recommendations:  Problem # 1:  OSTEOARTHRITIS (ICD-715.90) Assessment Deteriorated  Time spent with patient 45 minutes, more than 50% of this time was spent discussing the difficulty she has had getting her labs drawn.  Her tingling in her bottom most likely related to hip.  Advised to keep appt with ortho today.  Copy of her labs given to her to take with her.  I apologized to her for having such a difficult time and advised her call me in the future if she has any more problems.    Her updated medication list for this problem includes:    Acetaminophen 500 Mg Tabs (Acetaminophen) .Marland Kitchen... Take one tablet by mouth every four to six hours as needed for pain    Aspirin Ec 81 Mg Tbec (Aspirin) .Marland Kitchen... Take one tablet by mouth daily    Naprosyn 500 Mg Tabs (Naproxen) .Marland Kitchen... 1 two times a day pc prn  Her updated medication list for this problem includes:    Acetaminophen 500 Mg Tabs (Acetaminophen) .Marland Kitchen... Take one tablet by mouth every four to six hours as needed for pain    Aspirin Ec 81 Mg Tbec (Aspirin) .Marland Kitchen... Take one tablet by mouth daily    Naprosyn 500 Mg Tabs (Naproxen) .Marland Kitchen... 1 two times a day pc prn  Problem # 2:  GERD (ICD-530.81) Assessment: Deteriorated  see HPI.  Her updated medication list for this problem includes:    Dexilant 60 Mg Cpdr (Dexlansoprazole) .Marland Kitchen... Take one tablet by mouth daily  Her updated medication list for this problem includes:    Dexilant 60 Mg Cpdr (Dexlansoprazole) .Marland Kitchen... Take one tablet by mouth daily  Complete Medication List: 1)  Detrol La 4 Mg Xr24h-cap (Tolterodine tartrate) .... Take one by mouth at bedtime 2)  Lipitor 10 Mg Tabs (Atorvastatin calcium) .... Take one tablet by mouth at bedtime 3)  Symbicort 80-4.5 Mcg/act Aero (Budesonide-formoterol fumarate) .... Two puffs two times a day as needed 4)  Nasonex  50 Mcg/act Susp (Mometasone furoate) .... Two sprays each nostril two times a day as needed 5)  Dexilant 60 Mg Cpdr (Dexlansoprazole) .... Take one tablet by mouth daily 6)  Ondansetron Hcl 4 Mg Tabs (Ondansetron hcl) .... Dissolve one talet under the tongue as needed 7)  Theophylline Cr 200 Mg Xr12h-tab (Theophylline) .... Take 1 tablet by mouth once a day 8)  Acetaminophen 500 Mg Tabs (Acetaminophen) .... Take one tablet by mouth every four to six hours as needed for pain 9)  Calcium Citrate + 315-200 Mg-unit Tabs (Calcium citrate-vitamin d) .... Take one tablet by mouth two times a day 10)  Multivitamins Tabs (Multiple vitamin) .... Take one tablet by mouth daily 11)  Saline Nasal Spray 0.65 % Soln (Saline) .... One spray each nostril every two hours as needed 12)  Zyrtec Allergy 10 Mg Tabs (Cetirizine hcl) .... Take one tablet by mouth at bedtime 13)  Miralax  Powd (Polyethylene glycol 3350) .Marland KitchenMarland KitchenMarland Kitchen 17 grams by mouth everyday as needed 14)  Aspirin Ec 81 Mg Tbec (Aspirin) .... Take one tablet by mouth daily 15)  Losartan Potassium 100 Mg Tabs (Losartan potassium) .Marland Kitchen.. 1 tab daily for high blood pressure 16)  Ambien 5 Mg Tabs (Zolpidem tartrate) .Marland Kitchen.. 1 tab by mouth at bedtime as needed insomnia. 17)  Naprosyn 500 Mg Tabs (Naproxen) .Marland Kitchen.. 1 two times a day pc prn

## 2010-10-19 NOTE — Assessment & Plan Note (Signed)
Summary: [facility] Visit   Vital Signs:  Patient profile:   77 year old female Height:      60 inches Weight:      128 pounds  History of Present Illness: 77 yo here to discuss her back pain and medications.    Saw Dr. Sheppard Penton on 09/14/10 to discuss right hip replacement. Per pt, after multiple xrays, he felt her problem was in her back, not her hip. Had MRI of thoracic and lumbar spine, and ultrasound guided injections in her throacic vertebrae on 09/20/10.    Pain improved with this steroid injections but still having some right back pain.    Taking Tylenol for the pain- 500 mg two times a day and wants to make sure that is ok.  No dysuria, fever or increased urinary frequency.    No LE weakness or radiculopathy.    Current Medications (verified): 1)  Detrol La 4 Mg Xr24h-Cap (Tolterodine Tartrate) .... Take One By Mouth At Bedtime 2)  Lipitor 10 Mg Tabs (Atorvastatin Calcium) .... Take One Tablet By Mouth At Bedtime 3)  Symbicort 80-4.5 Mcg/act Aero (Budesonide-Formoterol Fumarate) .... Two Puffs Two Times A Day As Needed 4)  Nasonex 50 Mcg/act Susp (Mometasone Furoate) .... Two Sprays Each Nostril Two Times A Day As Needed 5)  Dexilant 60 Mg Cpdr (Dexlansoprazole) .... Take One Tablet By Mouth Daily 6)  Ondansetron Hcl 4 Mg Tabs (Ondansetron Hcl) .... Dissolve One Talet Under The Tongue As Needed 7)  Theophylline Cr 200 Mg Xr12h-Tab (Theophylline) .... Take 1 Tablet By Mouth Once A Day 8)  Acetaminophen 500 Mg Tabs (Acetaminophen) .... Take One Tablet By Mouth Every Four To Six Hours As Needed For Pain 9)  Calcium Citrate + 315-200 Mg-Unit Tabs (Calcium Citrate-Vitamin D) .... Take One Tablet By Mouth Two Times A Day 10)  Multivitamins  Tabs (Multiple Vitamin) .... Take One Tablet By Mouth Daily 11)  Saline Nasal Spray 0.65 % Soln (Saline) .... One Spray Each Nostril Every Two Hours As Needed 12)  Zyrtec Allergy 10 Mg Tabs (Cetirizine Hcl) .... Take One Tablet By Mouth At  Bedtime 13)  Miralax  Powd (Polyethylene Glycol 3350) .Marland KitchenMarland KitchenMarland Kitchen 17 Grams By Mouth Everyday As Needed 14)  Aspirin Ec 81 Mg Tbec (Aspirin) .... Take One Tablet By Mouth Daily 15)  Losartan Potassium 100 Mg Tabs (Losartan Potassium) .Marland Kitchen.. 1 Tab Daily For High Blood Pressure 16)  Ambien 5 Mg Tabs (Zolpidem Tartrate) .Marland Kitchen.. 1 Tab By Mouth At Bedtime As Needed Insomnia. 17)  Naprosyn 500 Mg Tabs (Naproxen) .Marland Kitchen.. 1 Two Times A Day Pc Prn  Allergies (verified): 1)  * Scopolamine  Past History:  Past Medical History: Last updated: 03/10/2010 Hypertension Asthma Chronic Vertigo Allergic rhinitis Osteoporosis Colonic polyps, hx of Lung cancer, hx of Urinary incontinence Hyperlipidemia GERD Osteoarthritis  Past Surgical History: Last updated: 22-Feb-2010 Total hip replacement-02/2010 Esophageal Hernia-01/2007, repairs x four Bladder repair Cataract extraction-Bilateral Hysterectomy Appendectomy Cholecystectomy  Family History: Last updated: 2010/02/22 Father died at age 69-Stroke or Pulmonary Embolism Mother died at in her 43's-Staph infection She is an only child  Social History: Last updated: 03/10/2010 Occupation: [employer] Retired Widow/Widower:2007 Children: Four sons, One died of brain tumor Alcohol use-yes, occassional wine  Son Barrister's clerk has health care POA. Would accept resuscitation attempts but no prologned ventilation. No feeding tube  Review of Systems      See HPI General:  Denies fever. GU:  Denies dysuria, incontinence, and urinary frequency. MS:  Denies muscle weakness.  Physical Exam  General:  alert and normal appearance.   Abdomen:  soft and non-tender.   No CVA tenderness Msk:  TTP over T5/T6 otherwise normal exam, FROM Psych:  normally interactive, good eye contact, not anxious appearing, and not depressed appearing.     Impression & Recommendations:  Problem # 1:  BACK PAIN (ICD-724.5) Assessment Unchanged Time spent with patient  25 minutes, more than 50% of this time was spent counseling patient on back pain and her medications. We do not yet have records from Dr. Sheppard Penton but I assume she has a compression fracture and possible OA. Agreed that her Tylenol dosage is very appropriate.  I advised not taking Naprosyn given her gastritis issues.  Her updated medication list for this problem includes:    Acetaminophen 500 Mg Tabs (Acetaminophen) .Marland Kitchen... Take one tablet by mouth every four to six hours as needed for pain    Aspirin Ec 81 Mg Tbec (Aspirin) .Marland Kitchen... Take one tablet by mouth daily    Naprosyn 500 Mg Tabs (Naproxen) .Marland Kitchen... 1 two times a day pc prn  Complete Medication List: 1)  Detrol La 4 Mg Xr24h-cap (Tolterodine tartrate) .... Take one by mouth at bedtime 2)  Lipitor 10 Mg Tabs (Atorvastatin calcium) .... Take one tablet by mouth at bedtime 3)  Symbicort 80-4.5 Mcg/act Aero (Budesonide-formoterol fumarate) .... Two puffs two times a day as needed 4)  Nasonex 50 Mcg/act Susp (Mometasone furoate) .... Two sprays each nostril two times a day as needed 5)  Dexilant 60 Mg Cpdr (Dexlansoprazole) .... Take one tablet by mouth daily 6)  Ondansetron Hcl 4 Mg Tabs (Ondansetron hcl) .... Dissolve one talet under the tongue as needed 7)  Theophylline Cr 200 Mg Xr12h-tab (Theophylline) .... Take 1 tablet by mouth once a day 8)  Acetaminophen 500 Mg Tabs (Acetaminophen) .... Take one tablet by mouth every four to six hours as needed for pain 9)  Calcium Citrate + 315-200 Mg-unit Tabs (Calcium citrate-vitamin d) .... Take one tablet by mouth two times a day 10)  Multivitamins Tabs (Multiple vitamin) .... Take one tablet by mouth daily 11)  Saline Nasal Spray 0.65 % Soln (Saline) .... One spray each nostril every two hours as needed 12)  Zyrtec Allergy 10 Mg Tabs (Cetirizine hcl) .... Take one tablet by mouth at bedtime 13)  Miralax Powd (Polyethylene glycol 3350) .Marland KitchenMarland KitchenMarland Kitchen 17 grams by mouth everyday as needed 14)  Aspirin Ec 81 Mg Tbec  (Aspirin) .... Take one tablet by mouth daily 15)  Losartan Potassium 100 Mg Tabs (Losartan potassium) .Marland Kitchen.. 1 tab daily for high blood pressure 16)  Ambien 5 Mg Tabs (Zolpidem tartrate) .Marland Kitchen.. 1 tab by mouth at bedtime as needed insomnia. 17)  Naprosyn 500 Mg Tabs (Naproxen) .Marland Kitchen.. 1 two times a day pc prn

## 2010-10-19 NOTE — Progress Notes (Signed)
Summary: Jennifer Roman  Phone Note Refill Request Message from:  Fax from Pharmacy on October 13, 2010 9:33 AM  Refills Requested: Medication #1:  AMBIEN 5 MG TABS 1 tab by mouth at bedtime as needed insomnia.   Last Refilled: 07/13/2010 Refill request from [place]. [phone].   Initial call taken by: Melody Comas,  October 13, 2010 9:34 AM  Follow-up for Phone Call        Rx called to pharmacy Follow-up by: Linde Gillis CMA Duncan Dull),  October 13, 2010 9:54 AM    Prescriptions: AMBIEN 5 MG TABS (ZOLPIDEM TARTRATE) 1 tab by mouth at bedtime as needed insomnia.  #30 x 0   Entered and Authorized by:   Ruthe Mannan MD   Signed by:   Ruthe Mannan MD on 10/13/2010   Method used:   Telephoned to ...         RxID:   [id number]

## 2010-11-15 ENCOUNTER — Telehealth: Payer: Self-pay | Admitting: Family Medicine

## 2010-11-22 NOTE — Progress Notes (Signed)
Summary: Remus Loffler  Phone Note Refill Request Message from:  Fax from Pharmacy on November 15, 2010 11:07 AM  Refills Requested: Medication #1:  AMBIEN 5 MG TABS 1 tab by mouth at bedtime as needed insomnia.   Last Refilled: 10/13/2010 Refill request from [place]. [phone].   Initial call taken by: Melody Comas,  November 15, 2010 11:10 AM  Follow-up for Phone Call        ok to refill. plz call in. Follow-up by: Eustaquio Boyden  MD,  November 15, 2010 11:32 AM  Additional Follow-up for Phone Call Additional follow up Details #1::        Rx called in as directed.  Additional Follow-up by: Janee Morn CMA Duncan Dull),  November 15, 2010 12:46 PM    Prescriptions: AMBIEN 5 MG TABS (ZOLPIDEM TARTRATE) 1 tab by mouth at bedtime as needed insomnia.  #30 x 0   Entered and Authorized by:   Eustaquio Boyden  MD   Signed by:   Eustaquio Boyden  MD on 11/15/2010   Method used:   Telephoned to ...         RxID:   [id number]

## 2010-12-09 ENCOUNTER — Other Ambulatory Visit: Payer: Self-pay | Admitting: *Deleted

## 2010-12-09 MED ORDER — ZOLPIDEM TARTRATE 5 MG PO TABS: ORAL_TABLET | ORAL | Status: DC

## 2010-12-09 NOTE — Telephone Encounter (Signed)
Ok to refill 

## 2010-12-09 NOTE — Telephone Encounter (Signed)
[facility]ed to [facility]. 

## 2010-12-14 ENCOUNTER — Non-Acute Institutional Stay: Payer: Medicare Other | Admitting: Family Medicine

## 2010-12-14 ENCOUNTER — Encounter: Payer: Self-pay | Admitting: Family Medicine

## 2010-12-20 LAB — BASIC METABOLIC PANEL
Calcium: 8.9 mg/dL (ref 8.4–10.5)
GFR calc Af Amer: >60 mL/min (ref >60)
GFR calc non Af Amer: >60 mL/min (ref >60)
Glucose, Bld: 190 mg/dL — ABNORMAL HIGH (ref 70–99)
Potassium: 5 mEq/L (ref 3.5–5.1)
Sodium: 140 mEq/L (ref 135–145)

## 2010-12-20 LAB — COMPREHENSIVE METABOLIC PANEL
ALT: 30 U/L (ref 0–35)
Alkaline Phosphatase: 75 U/L (ref 39–117)
BUN: 13 mg/dL (ref 6–23)
CO2: 31 mEq/L (ref 19–32)
Chloride: 105 mEq/L (ref 96–112)
GFR calc non Af Amer: >60 mL/min (ref >60)
Glucose, Bld: 88 mg/dL (ref 70–99)
Potassium: 3.9 mEq/L (ref 3.5–5.1)
Sodium: 142 mEq/L (ref 135–145)
Total Bilirubin: 0.9 mg/dL (ref 0.3–1.2)

## 2010-12-20 LAB — DIFFERENTIAL
Basophils Absolute: 0 10*3/uL (ref 0.0–0.1)
Basophils Relative: 0 % (ref 0–1)
Eosinophils Absolute: 0.1 10*3/uL (ref 0.0–0.7)
Monocytes Relative: 8 % (ref 3–12)
Neutro Abs: 5.8 10*3/uL (ref 1.7–7.7)
Neutrophils Relative %: 69 % (ref 43–77)

## 2010-12-20 LAB — CBC
HCT: 40.8 % (ref 36.0–46.0)
Hemoglobin: 11.7 g/dL — ABNORMAL LOW (ref 12.0–15.0)
Hemoglobin: 13.8 g/dL (ref 12.0–15.0)
Platelets: 235 10*3/uL (ref 150–400)
RBC: 4.46 MIL/uL (ref 3.87–5.11)
RDW: 12.8 % (ref 11.5–15.5)
RDW: 12.9 % (ref 11.5–15.5)
WBC: 9.9 10*3/uL (ref 4.0–10.5)

## 2010-12-21 ENCOUNTER — Non-Acute Institutional Stay: Payer: Medicare Other | Admitting: Family Medicine

## 2010-12-21 DIAGNOSIS — M25559 Pain in unspecified hip: Secondary | ICD-10-CM

## 2010-12-21 DIAGNOSIS — M25551 Pain in right hip: Secondary | ICD-10-CM

## 2010-12-21 DIAGNOSIS — M549 Dorsalgia, unspecified: Secondary | ICD-10-CM

## 2010-12-21 NOTE — Progress Notes (Signed)
77 yo here to discuss hip pain and medications.    Scheduled to have right total hip replacement on 01/16/2011 by Dr. Artis Flock.  Wants to go over which medications she should take int he hospital.  The PMH, PSH, Social History, Family History, Medications, and allergies have been reviewed in [facility], and have been updated if relevant.   Review of Systems       See HPI General:  Denies fever. GU:  Denies dysuria, incontinence, and urinary frequency. MS:  Denies muscle weakness.  Physical Exam  General:  alert and normal appearance.   Abdomen:  soft and non-tender.   No CVA tenderness Msk:  TTP over T5/T6 otherwise normal exam, FROM Psych:  normally interactive, good eye contact, not anxious appearing, and not depressed appearing.

## 2010-12-21 NOTE — Assessment & Plan Note (Signed)
Schedule for THR next month. >25 min spent with patient, at least half of which was spent on counseling on her medications. Pt will call me with an updated after her surgery.

## 2011-01-03 ENCOUNTER — Ambulatory Visit: Payer: Self-pay | Admitting: General Practice

## 2011-01-04 ENCOUNTER — Ambulatory Visit (INDEPENDENT_AMBULATORY_CARE_PROVIDER_SITE_OTHER): Payer: Medicare Other | Admitting: Family Medicine

## 2011-01-04 DIAGNOSIS — M25559 Pain in unspecified hip: Secondary | ICD-10-CM

## 2011-01-04 DIAGNOSIS — M25551 Pain in right hip: Secondary | ICD-10-CM

## 2011-01-04 NOTE — Progress Notes (Signed)
77 yo here to discuss hip pain and medications.   Also has a sore throat since restarting Symbicort.  No runny nose, fever, cough or other symptoms.  Scheduled to have right total hip replacement on 01/16/2011 by Dr. Artis Flock.  Had pre op yesterday and wants to go over her paperwork she received listing what medications she will receive in the hospital.  The PMH, PSH, Social History, Family History, Medications, and allergies have been reviewed in [facility], and have been updated if relevant.   Review of Systems       See HPI General:  Denies fever. GU:  Denies dysuria, incontinence, and urinary frequency. MS:  Denies muscle weakness.  Physical Exam  General:  alert and normal appearance.   HEENT:  No erythema or exudate, no palpable nodes Abdomen:  soft and non-tender.   No CVA tenderness Msk:  TTP over T5/T6 otherwise normal exam, FROM Psych:  normally interactive, good eye contact, not anxious appearing, and not depressed appearing.

## 2011-01-05 ENCOUNTER — Ambulatory Visit: Payer: Medicare Other | Admitting: Family Medicine

## 2011-01-05 NOTE — Assessment & Plan Note (Signed)
>  25 min spent with face to face with patient counseling and coordinating care. Reviewed paperwork with her, all meds are similar to what she is taking now. Also discussed rinsing mouth out after using symbicort. If symptoms progress, she will come see me at [facility] office.

## 2011-01-09 ENCOUNTER — Other Ambulatory Visit: Payer: Self-pay | Admitting: *Deleted

## 2011-01-10 ENCOUNTER — Other Ambulatory Visit: Payer: Self-pay | Admitting: *Deleted

## 2011-01-10 HISTORY — PX: JOINT REPLACEMENT: SHX530

## 2011-01-10 MED ORDER — ZOLPIDEM TARTRATE 5 MG PO TABS: 5.0000 mg | ORAL_TABLET | Freq: Every evening | ORAL | Status: DC | PRN

## 2011-01-10 MED ORDER — ALBUTEROL 90 MCG/ACT IN AERS: 2.0000 | INHALATION_SPRAY | Freq: Four times a day (QID) | RESPIRATORY_TRACT | Status: DC | PRN

## 2011-01-10 NOTE — Telephone Encounter (Signed)
duplicate

## 2011-01-10 NOTE — Telephone Encounter (Signed)
Okay #30 x 1 

## 2011-01-10 NOTE — Telephone Encounter (Signed)
3 messages for the same Rx!!

## 2011-01-10 NOTE — Telephone Encounter (Signed)
rx faxed to pharmacy

## 2011-01-16 ENCOUNTER — Inpatient Hospital Stay: Payer: Self-pay | Admitting: General Practice

## 2011-01-18 LAB — PATHOLOGY REPORT

## 2011-01-24 NOTE — Op Note (Signed)
NAME:  Jennifer Roman, Jennifer Roman                ACCOUNT NO.:  [id number]   MEDICAL RECORD NO.:  [id number]          PATIENT TYPE:  INP   LOCATION:  0009                         FACILITY:  [facility]   PHYSICIAN:  Sandria Bales. Ezzard Standing, M.D.  DATE OF BIRTH:  October 10, 1927   DATE OF PROCEDURE:  02/01/2009  DATE OF DISCHARGE:                               OPERATIVE REPORT   Date of Surgery ?   PREOPERATIVE DIAGNOSIS:  Gastrocutaneous fistula.   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   PROCEDURE:  Excision of gastrocutaneous fistula with closure of stomach.   SURGEON:  Sandria Bales. Ezzard Standing, M.D.   FIRST ASSISTANT:  None.   ANESTHESIA:  General endotracheal.   ESTIMATED BLOOD LOSS:  Minimal.   INDICATIONS FOR PROCEDURE:  Jennifer Roman is a 77 year old white female who  is a patient of Dr. Annett Fabian of [facility].  She sees Dr. Welton Flakes in  [place] from pulmonary standpoint, underwent a revision of a hiatal  hernia repair in May 2009 at [facility] but she developed a chronic  gastrocutaneous fistula where they put a gastrostomy tube.  This fistula  was explored at [facility] in November 2009, but she has had persistent  drainage since that time.  She saw Dr. Ritta Slot, who did a  panendoscopy, felt the Nissan was in good position, but saw evidence of  the gastrocutaneous fistula from endoscopic standpoint.  I was asked to  see her to discuss closure of this gastrocutaneous fistula.   I discussed with her about repairing the fistula.  I told her the  indication and potential risk. The potential risks include, but are not  limited to, bleeding, infection, leak from her stomach, recurrent  fistula, and that she may have too much scar tissue to actually do this  safely.   OPERATIVE NOTE:  The patient placed in supine position, given a general  anesthetic.  She was prepped with DuraPrep, given a gram of Ancef at  initiation of procedure, had  a Foley catheter in place.  It is  interesting she had a meatal web at her urethral orifice.  Whether this  has caused any trouble I do not know for her but we got the Foley in  without any difficulty.  I then did a time-out identifying the patient  and procedure.   Because of her from multiple prior abdominal operations, I went through  an infraumbilical Hassan technique to get into her belly.  I placed a 12  mm Hasson trocar secured with 0 Vicryl suture.  Actually she had very  few adhesions except for this stomach attached to her left upper  abdominal wall.   I dissected some of this off.  When I got enough dissected I thought  that I could fire a stapler across.  I got the 60 blue load Ethicon  Echelon stapler and fired this across the gastrocutaneous fistula.   There was a remnant of stomach still up against the anterior abdominal  wall.  I then dissected this from the laparoscopic side of the  peritoneal side and I cut down on this fistula and dissected it from the  skin side.   I was able to remove the entire fistula and within the middle of the  fistula there was a small T fastener.  This is probably part of the  reason this thing was stayed persistently draining like it was.   I then reinspected the staple line.  I irrigated this.  The staple line  looked good.  There was no bleeding for the staple line.  I then closed  the left upper quadrant decision using Endoclose with about four  stitches of #1 Vicryl suture.  I closed the umbilicus with a #1 Vicryl  suture and I closed the skin each site with a 5-0 Vicryl suture, painted  the wound with tincture of benzoin and steri-stripped.   The patient tolerated the procedure well, was transported to recovery  room in good condition.      Sandria Bales. Ezzard Standing, M.D.  Electronically Signed     DHN/MEDQ  D:  02/01/2009  T:  02/01/2009  Job:  [id number]   cc:   Annett Fabian, M.D.  [employer].   Dr. Lynn Ito   [place]   Griffith Citron, M.D.  Fax: [phone]

## 2011-01-27 NOTE — Discharge Summary (Signed)
NAME:  Jennifer Roman, Jennifer Roman                ACCOUNT NO.:  [id number]   MEDICAL RECORD NO.:  [id number]          PATIENT TYPE:  INP   LOCATION:  1537                         FACILITY:  [facility]   PHYSICIAN:  Sandria Bales. Ezzard Standing, M.D.  DATE OF BIRTH:  Feb 22, 1928   DATE OF ADMISSION:  02/01/2009  DATE OF DISCHARGE:  02/04/2009                               DISCHARGE SUMMARY   Date of Admission ?  Date of Discharge ?   DISCHARGE DIAGNOSES:  1. Gastrocutaneous fistula.  2. Gastroesophageal reflux, status post hiatal hernia repair.  3. Asthma.  4. Hypercholesterolemia.  5. Hypertension.  6. Remote history of left lung cancer, disease free at this time.  7. 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   OPERATION PERFORMED:  Patient had a laparoscopic closure of  gastrocutaneous fistula on Feb 01, 2009.   HISTORY OF ILLNESS:  Ms. Hunton is a 77 year old white female who sees Dr.  Annett Fabian in [place], [place]., as her primary medical doctor and  Dr. Welton Flakes as a pulmonologist and Dr. Ritta Slot in [place] from a  gastroenterology standpoint.   In May of 2008, she underwent repair of a hiatal hernia by Dr. Michela Pitcher in  [facility].  She did well for about 8 months  and started having symptoms of recurrent asthma and reflux.  She was  referred to Dr. Milana Kidney at [facility] who did a redo hiatal  hernia repair using a piece of mesh for esophageal hiatus and placed a  gastrostomy tube.   She did well from a reflux standpoint.  Unfortunately, she has had  persistent drainage from her gastrostomy tube.   Apparently, in about November 2000, Dr. Jacquenette Shone went back and explored  this wound but she has had persistent drainage since that time.   She now comes to me for attempted closure of this gastrocutaneous  fistula.  I then discussed the indication and potential complications  with the patient.   OTHER MEDICAL PROBLEMS:  Include:  1. Asthma.  2.  Hypercholesterolemia.  3. Hypertension.  4. Status post left lung cancer which she is disease free.   On the day of admission, she was taken to the operating room where she  underwent a laparoscopic-assisted closure of her gastrocutaneous  fistula.  I did find actually a T fastener in this fistula which is  probably part of the reason it stayed persistent.   Postoperatively, she did well.  On her first postoperative day, her  hemoglobin was 11, hematocrit 35, white blood count of 9900.  By the 2nd postoperative day, she was tolerating clear liquids and by  the 3rd postoperative day she had a bowel movement, was afebrile, her  wounds looked okay, and she was ready for discharge.   DISCHARGE MEDICINES:  Were her admission medicines which included:  1. Detrol LA 4 mg.  2. Cetirizine 10 mg daily.  3. Lipitor 10 mg daily.  4. Nasonex spray.  5. Symbicort spray.  6. Avapro 300 mg daily.  7. Kapidex 60 mg daily.  8. Theophylline 200 mg daily.  9.  She can restart her aspirin in 3 days.  10.ProAir.  11.Ondansetron as needed.   She was given Vicodin for pain.  She was to see me back in 2 to 3 weeks  for wound check and to call for any problems.   DISCHARGE CONDITION:  Good.   FINAL PATHOLOGY:  Showed acute inflammation fibrosis of her  gastrocutaneous fistula but no evidence of malignancy.      Sandria Bales. Ezzard Standing, M.D.  Electronically Signed     DHN/MEDQ  D:  03/22/2009  T:  03/22/2009  Job:  [id number]   cc:   Annett Fabian, M.D.   Dr. Lynn Ito, [place]   Griffith Citron, M.D.  Fax: [phone]   Merri Ray, M.D.

## 2011-04-03 ENCOUNTER — Other Ambulatory Visit: Payer: Self-pay | Admitting: *Deleted

## 2011-04-03 MED ORDER — ZOLPIDEM TARTRATE 5 MG PO TABS: 5.0000 mg | ORAL_TABLET | Freq: Every evening | ORAL | Status: DC | PRN

## 2011-04-03 NOTE — Telephone Encounter (Signed)
rx faxed to pharmacy manually  

## 2011-04-03 NOTE — Telephone Encounter (Signed)
Okay #30 x 1 

## 2011-04-03 NOTE — Telephone Encounter (Signed)
Form on your desk  

## 2011-04-05 ENCOUNTER — Other Ambulatory Visit: Payer: Self-pay | Admitting: *Deleted

## 2011-04-05 MED ORDER — ATORVASTATIN CALCIUM 10 MG PO TABS: 10.0000 mg | ORAL_TABLET | Freq: Every day | ORAL | Status: DC

## 2011-04-05 MED ORDER — DEXLANSOPRAZOLE 60 MG PO CPDR: 60.0000 mg | DELAYED_RELEASE_CAPSULE | Freq: Every day | ORAL | Status: DC

## 2011-04-05 MED ORDER — TOLTERODINE TARTRATE ER 4 MG PO CP24: 4.0000 mg | ORAL_CAPSULE | Freq: Every day | ORAL | Status: DC

## 2011-04-05 NOTE — Telephone Encounter (Signed)
rx sent to pharmacy by e-script  

## 2011-04-06 ENCOUNTER — Other Ambulatory Visit: Payer: Self-pay | Admitting: *Deleted

## 2011-04-06 MED ORDER — THEOPHYLLINE ER 200 MG PO CP24: 200.0000 mg | ORAL_CAPSULE | Freq: Every day | ORAL | Status: DC

## 2011-04-06 NOTE — Telephone Encounter (Signed)
rx sent by Dr.Aron #30 x1

## 2011-05-01 ENCOUNTER — Telehealth: Payer: Self-pay | Admitting: *Deleted

## 2011-05-01 NOTE — Telephone Encounter (Signed)
Spoke to patient and was advised that she called and got her surgeon Dr. Ernest Pine to prescribe this for her and has already picked it up. Patient notified as instructed.

## 2011-05-01 NOTE — Telephone Encounter (Signed)
Pt has broken tooth and needs abx called in for dental procedure.

## 2011-05-01 NOTE — Telephone Encounter (Signed)
If she is going to the dentist and needs pretreatment, okay amoxicillin 500mg   4 tabs about 1 hour before dental procedure If she needs ongoing Rx, dentist should provide that

## 2011-05-08 ENCOUNTER — Other Ambulatory Visit: Payer: Self-pay | Admitting: *Deleted

## 2011-05-08 MED ORDER — THEOPHYLLINE ER 200 MG PO CP24: 200.0000 mg | ORAL_CAPSULE | Freq: Every day | ORAL | Status: DC

## 2011-05-31 ENCOUNTER — Encounter: Payer: Self-pay | Admitting: Internal Medicine

## 2011-05-31 ENCOUNTER — Ambulatory Visit (INDEPENDENT_AMBULATORY_CARE_PROVIDER_SITE_OTHER): Payer: Medicare Other | Admitting: Internal Medicine

## 2011-05-31 DIAGNOSIS — E785 Hyperlipidemia, unspecified: Secondary | ICD-10-CM

## 2011-05-31 DIAGNOSIS — I10 Essential (primary) hypertension: Secondary | ICD-10-CM

## 2011-05-31 DIAGNOSIS — R32 Unspecified urinary incontinence: Secondary | ICD-10-CM

## 2011-05-31 DIAGNOSIS — G479 Sleep disorder, unspecified: Secondary | ICD-10-CM

## 2011-05-31 DIAGNOSIS — Z23 Encounter for immunization: Secondary | ICD-10-CM

## 2011-05-31 DIAGNOSIS — M1990 Unspecified osteoarthritis, unspecified site: Secondary | ICD-10-CM

## 2011-05-31 LAB — HEPATIC FUNCTION PANEL
Bilirubin, Direct: 0 mg/dL (ref 0.0–0.3)
Total Bilirubin: 0.5 mg/dL (ref 0.3–1.2)

## 2011-05-31 LAB — BASIC METABOLIC PANEL
CO2: 27 mEq/L (ref 19–32)
Calcium: 9.2 mg/dL (ref 8.4–10.5)
Creatinine, Ser: 0.7 mg/dL (ref 0.4–1.2)
GFR: 81.37 mL/min (ref >60.00)
Glucose, Bld: 105 mg/dL — ABNORMAL HIGH (ref 70–99)

## 2011-05-31 LAB — LIPID PANEL
HDL: 69.3 mg/dL (ref >39.00)
Total CHOL/HDL Ratio: 2
Triglycerides: 144 mg/dL (ref 0.0–149.0)
VLDL: 28.8 mg/dL (ref 0.0–40.0)

## 2011-05-31 LAB — CBC WITH DIFFERENTIAL/PLATELET
Basophils Absolute: 0.1 10*3/uL (ref 0.0–0.1)
Eosinophils Absolute: 0.1 10*3/uL (ref 0.0–0.7)
Lymphocytes Relative: 27.3 % (ref 12.0–46.0)
MCHC: 31.6 g/dL (ref 30.0–36.0)
Monocytes Relative: 6.8 % (ref 3.0–12.0)
Neutrophils Relative %: 63.7 % (ref 43.0–77.0)
RDW: 19 % — ABNORMAL HIGH (ref 11.5–14.6)

## 2011-05-31 LAB — TSH: TSH: 1.6 u[IU]/mL (ref 0.35–5.50)

## 2011-05-31 MED ORDER — ONDANSETRON HCL 4 MG PO TABS: 4.0000 mg | ORAL_TABLET | Freq: Two times a day (BID) | ORAL | Status: DC | PRN

## 2011-05-31 NOTE — Assessment & Plan Note (Signed)
Does get help from zolpidem Discussed not using it every night

## 2011-05-31 NOTE — Assessment & Plan Note (Signed)
Has done great since hip replacements Only uses tylenol and is active

## 2011-05-31 NOTE — Assessment & Plan Note (Signed)
BP Readings from Last 3 Encounters:  05/31/11 137/52  09/13/10 126/78  06/30/10 126/80   Good control Due for labs No changes needed Okay on the losartan

## 2011-05-31 NOTE — Assessment & Plan Note (Signed)
No problems with the med Will check labs 

## 2011-05-31 NOTE — Assessment & Plan Note (Signed)
Still satisfied with detrol

## 2011-05-31 NOTE — Progress Notes (Signed)
Subjective:    Patient ID: Jennifer Roman, female    DOB: [birth year], 77 y.o.   MRN: [id number]  HPI Had right hip done in May Needed round the clock care at home for only 3 days Had the lovenox for 2 weeks Didn't need rehab  Was able to walk right away Now doesn't wobble Pain is not an issue---uses 1 tylenol in the morning  No chest pain No SOB No edema Did eventually change to losartan (from avapro) due to insurance requirement Feels that is not as good with BP control  Still on lipitor No myalgias or GI problems  No longer using symbicort regularly---prn if going out in cold, etc. Still on theophylline No cough or SOB  Current Outpatient Prescriptions on File Prior to Visit  Medication Sig Dispense Refill  . acetaminophen (TYLENOL) 500 MG tablet Take 500 mg by mouth every 6 (six) hours as needed.        Marland Kitchen albuterol (PROVENTIL,VENTOLIN) 90 MCG/ACT inhaler Inhale 2 puffs into the lungs every 6 (six) hours as needed.  17 g  1  . aspirin EC 81 MG EC tablet Take 81 mg by mouth daily.        Marland Kitchen atorvastatin (LIPITOR) 10 MG tablet Take 1 tablet (10 mg total) by mouth at bedtime.  30 tablet  1  . budesonide-formoterol (SYMBICORT) 80-4.5 MCG/ACT inhaler Inhale 2 puffs into the lungs 2 (two) times daily.        . calcium citrate-vitamin D (CITRACAL+D) 315-200 MG-UNIT per tablet Take 1 tablet by mouth 2 (two) times daily.        . cetirizine (ZYRTEC) 10 MG tablet Take 10 mg by mouth daily.        Marland Kitchen dexlansoprazole (DEXILANT) 60 MG capsule Take 1 capsule (60 mg total) by mouth daily.  30 capsule  1  . losartan (COZAAR) 100 MG tablet Take 100 mg by mouth daily.        . mometasone (NASONEX) 50 MCG/ACT nasal spray 2 sprays by Nasal route 2 (two) times daily.        . Multiple Vitamin (MULTIVITAMIN) capsule Take 1 capsule by mouth daily.        . ondansetron (ZOFRAN) 4 MG tablet Take 4 mg by mouth as needed.        . polyethylene glycol (MIRALAX / GLYCOLAX) packet Take 17 g by mouth  daily.        . sodium chloride (OCEAN) 0.65 % nasal spray 1 spray by Nasal route as needed.        . theophylline (THEO-24) 200 MG 24 hr capsule Take 1 capsule (200 mg total) by mouth daily.  30 capsule  11  . tolterodine (DETROL LA) 4 MG 24 hr capsule Take 1 capsule (4 mg total) by mouth at bedtime.  30 capsule  1  . zolpidem (AMBIEN) 5 MG tablet Take 1 tablet (5 mg total) by mouth at bedtime as needed.  30 tablet  1    No Known Allergies  Past Medical History  Diagnosis Date  . Hypertension   . Asthma   . Vertigo     chronic  . Allergy   . Colonic polyp   . Cancer     lung  . Urinary incontinence   . Hyperlipidemia   . GERD (gastroesophageal reflux disease)   . Osteoporosis     osteoarthritis    Past Surgical History  Procedure Date  . Esophageal hernia 01/2007    repaired  x 4  . Bladder repair   . Eye surgery     cataract extraction, bilateral  . Vaginal hysterectomy   . Appendectomy   . Cholecystectomy   . Joint replacement 02/2010    Left THR  . Joint replacement 5/12    Right THR--Dr Hooten    Family History  Problem Relation Age of Onset  . Stroke Father   . Pulmonary embolism Father     History   Social History  . Marital Status: Widowed    Spouse Name: N/A    Number of Children: 4  . Years of Education: N/A   Occupational History  . Retired, [employer]    Social History Main Topics  . Smoking status: Never Smoker   . Smokeless tobacco: Never Used  . Alcohol Use: No  . Drug Use: No  . Sexually Active: Not on file   Other Topics Concern  . Not on file   Social History Narrative  . No narrative on file   Review of Systems Just had follow up colon by Dr Leone Brand polyps at all Had EGD also--looked okay Uses zyrtec and nasonex prn    Objective:   Physical Exam  Constitutional: She appears well-developed and well-nourished. No distress.  Neck: Normal range of motion. Neck supple. No thyromegaly present.    Cardiovascular: Normal rate, regular rhythm, normal heart sounds and intact distal pulses.  Exam reveals no gallop.   No murmur heard. Pulmonary/Chest: Effort normal and breath sounds normal. No respiratory distress. She has no wheezes. She has no rales.  Abdominal: Soft. There is no tenderness.  Musculoskeletal: Normal range of motion. She exhibits no edema and no tenderness.  Lymphadenopathy:    She has no cervical adenopathy.  Psychiatric: She has a normal mood and affect. Her behavior is normal. Judgment and thought content normal.          Assessment & Plan:

## 2011-06-05 ENCOUNTER — Other Ambulatory Visit: Payer: Self-pay

## 2011-06-05 MED ORDER — TOLTERODINE TARTRATE ER 4 MG PO CP24: 4.0000 mg | ORAL_CAPSULE | Freq: Every day | ORAL | Status: DC

## 2011-06-05 MED ORDER — ZOLPIDEM TARTRATE 5 MG PO TABS: 5.0000 mg | ORAL_TABLET | Freq: Every evening | ORAL | Status: DC | PRN

## 2011-06-05 NOTE — Telephone Encounter (Signed)
[facility] faxed refill request Zolpidem 5 mg lst refilled 05/06/11. Fax is in your in box.

## 2011-06-05 NOTE — Telephone Encounter (Signed)
Okay #30 x 1 

## 2011-06-05 NOTE — Telephone Encounter (Signed)
Completed form faxed to [facility] [phone]

## 2011-06-06 ENCOUNTER — Other Ambulatory Visit: Payer: Self-pay | Admitting: *Deleted

## 2011-06-06 MED ORDER — ATORVASTATIN CALCIUM 10 MG PO TABS: 10.0000 mg | ORAL_TABLET | Freq: Every day | ORAL | Status: DC

## 2011-06-07 ENCOUNTER — Encounter: Payer: Self-pay | Admitting: *Deleted

## 2011-06-29 ENCOUNTER — Telehealth: Payer: Self-pay | Admitting: *Deleted

## 2011-06-29 NOTE — Telephone Encounter (Signed)
Received a fax refill request from pharmacy to change Ondansetron 4 mg to the ODT instead of the plain per patient's request. Please advise.

## 2011-06-30 MED ORDER — ONDANSETRON 4 MG PO TBDP: 4.0000 mg | ORAL_TABLET | Freq: Two times a day (BID) | ORAL | Status: DC | PRN

## 2011-06-30 NOTE — Telephone Encounter (Signed)
Changed in medication list and sent new RX

## 2011-06-30 NOTE — Telephone Encounter (Signed)
It is fine to change to the ODT

## 2011-07-10 ENCOUNTER — Other Ambulatory Visit: Payer: Self-pay | Admitting: *Deleted

## 2011-07-10 MED ORDER — LOSARTAN POTASSIUM 100 MG PO TABS: 100.0000 mg | ORAL_TABLET | Freq: Every day | ORAL | Status: DC

## 2011-07-12 ENCOUNTER — Other Ambulatory Visit: Payer: Self-pay | Admitting: Internal Medicine

## 2011-07-12 DIAGNOSIS — D649 Anemia, unspecified: Secondary | ICD-10-CM

## 2011-07-12 DIAGNOSIS — D519 Vitamin B12 deficiency anemia, unspecified: Secondary | ICD-10-CM

## 2011-07-13 ENCOUNTER — Other Ambulatory Visit (INDEPENDENT_AMBULATORY_CARE_PROVIDER_SITE_OTHER): Payer: Medicare Other

## 2011-07-13 DIAGNOSIS — D518 Other vitamin B12 deficiency anemias: Secondary | ICD-10-CM

## 2011-07-13 DIAGNOSIS — D649 Anemia, unspecified: Secondary | ICD-10-CM

## 2011-07-13 DIAGNOSIS — D519 Vitamin B12 deficiency anemia, unspecified: Secondary | ICD-10-CM

## 2011-07-13 LAB — CBC WITH DIFFERENTIAL/PLATELET
Basophils Absolute: 0 10*3/uL (ref 0.0–0.1)
Eosinophils Absolute: 0.1 10*3/uL (ref 0.0–0.7)
Lymphs Abs: 2.3 10*3/uL (ref 0.7–4.0)
MCHC: 32.2 g/dL (ref 30.0–36.0)
MCV: 79.6 fl (ref 78.0–100.0)
Monocytes Absolute: 0.5 10*3/uL (ref 0.1–1.0)
Monocytes Relative: 8 % (ref 3.0–12.0)
Neutro Abs: 3.8 10*3/uL (ref 1.4–7.7)
Platelets: 301 10*3/uL (ref 150.0–400.0)
RBC: 4.73 Mil/uL (ref 3.87–5.11)
RDW: 21.2 % — ABNORMAL HIGH (ref 11.5–14.6)
WBC: 6.9 10*3/uL (ref 4.5–10.5)

## 2011-07-13 LAB — FERRITIN: Ferritin: 7.8 ng/mL — ABNORMAL LOW (ref 10.0–291.0)

## 2011-07-13 LAB — IBC PANEL: Iron: 36 ug/dL — ABNORMAL LOW (ref 42–145)

## 2011-07-18 ENCOUNTER — Telehealth: Payer: Self-pay | Admitting: *Deleted

## 2011-07-18 NOTE — Telephone Encounter (Signed)
Spoke with patient and advised results   

## 2011-07-18 NOTE — Telephone Encounter (Signed)
.  left message to have patient return my call.  

## 2011-07-18 NOTE — Telephone Encounter (Signed)
Message copied by Sueanne Margarita on Tue Jul 18, 2011 12:31 PM ------      Message from: Tillman Abide I      Created: Fri Jul 14, 2011  7:53 AM       Please call      The blood count has returned to normal---great news      The iron levels are still a little low so she should continue the iron supplement for another 1-2 months---then she can stop it

## 2011-08-09 ENCOUNTER — Other Ambulatory Visit: Payer: Self-pay | Admitting: *Deleted

## 2011-08-09 MED ORDER — DEXLANSOPRAZOLE 60 MG PO CPDR: 60.0000 mg | DELAYED_RELEASE_CAPSULE | Freq: Every day | ORAL | Status: DC

## 2011-08-09 MED ORDER — ZOLPIDEM TARTRATE 5 MG PO TABS: 5.0000 mg | ORAL_TABLET | Freq: Every evening | ORAL | Status: DC | PRN

## 2011-08-09 NOTE — Telephone Encounter (Signed)
rx called into pharmacy

## 2011-08-09 NOTE — Telephone Encounter (Signed)
Okay dexilant for 1 year Zolpidem #30 x 1

## 2011-10-05 ENCOUNTER — Other Ambulatory Visit: Payer: Self-pay | Admitting: *Deleted

## 2011-10-05 MED ORDER — ZOLPIDEM TARTRATE 5 MG PO TABS: 5.0000 mg | ORAL_TABLET | Freq: Every evening | ORAL | Status: DC | PRN

## 2011-10-05 NOTE — Telephone Encounter (Signed)
rx called into pharmacy

## 2011-10-05 NOTE — Telephone Encounter (Signed)
Okay #30 x 1 

## 2011-10-25 ENCOUNTER — Ambulatory Visit (INDEPENDENT_AMBULATORY_CARE_PROVIDER_SITE_OTHER): Payer: Medicare Other | Admitting: Family Medicine

## 2011-10-25 ENCOUNTER — Encounter: Payer: Self-pay | Admitting: Family Medicine

## 2011-10-25 ENCOUNTER — Telehealth: Payer: Self-pay | Admitting: Internal Medicine

## 2011-10-25 VITALS — BP 140/90 | HR 90 | Temp 97.8°F | Ht 60.0 in | Wt 128.4 lb

## 2011-10-25 DIAGNOSIS — L259 Unspecified contact dermatitis, unspecified cause: Secondary | ICD-10-CM

## 2011-10-25 DIAGNOSIS — L309 Dermatitis, unspecified: Secondary | ICD-10-CM

## 2011-10-25 MED ORDER — TRIAMCINOLONE ACETONIDE 0.1 % EX CREA: TOPICAL_CREAM | Freq: Two times a day (BID) | CUTANEOUS | Status: DC

## 2011-10-25 NOTE — Progress Notes (Signed)
  Patient Name: Jennifer Roman Date of Birth: Oct 22, 1927 Age: 78 y.o. Medical Record Number: [id number] Gender: female Date of Encounter: 10/25/2011  History of Present Illness:  Jennifer Roman is a 78 y.o. very pleasant female patient who presents with the following:  Woke up yesterday, on the anterior abdomen and the posterior thorax. Most on left postrerior abdomen.  Moisturizes a great deal.  Was looking at labels for the for the last 48 hours. Prepopped popcorn with mild. Also bran muffins. Several other things with lactaid.   Past Medical History, Surgical History, Social History, Family History, Problem List, Medications, and Allergies have been reviewed and updated if relevant.  Review of Systems:  GEN: No acute illnesses, no fevers, chills. GI: No n/v/d, eating normally Pulm: No SOB Interactive and getting along well at home.  Otherwise, ROS is as per the HPI.   Physical Examination: Filed Vitals:   10/25/11 0933  BP: 140/90  Pulse: 90  Temp: 97.8 F (36.6 C)  TempSrc: Oral  Height: 5' (1.524 m)  Weight: 128 lb 6.4 oz (58.242 kg)  SpO2: 98%    Body mass index is 25.08 kg/(m^2).   GEN: WDWN, NAD, Non-toxic, Alert & Oriented x 3 HEENT: Atraumatic, Normocephalic.  Ears and Nose: No external deformity. EXTR: No clubbing/cyanosis/edema NEURO: Normal gait.  PSYCH: Normally interactive. Conversant. Not depressed or anxious appearing.  Calm demeanor.   Skin: flat reddish area, L post thorax, R anterior abdomen. No vesicles  Assessment and Plan: 1. Dermatitis  triamcinolone cream (KENALOG) 0.1 %    Not infectious Unclear etiology -- h/o problems with lactose? Causing rash  TAC

## 2011-10-25 NOTE — Telephone Encounter (Signed)
Triage Record Num: [id number] Operator: Di Kindle Patient Name: [facility] Call Date & Time: 10/24/2011 4:54:46PM Patient Phone: [phone] PCP: Tillman Abide Patient Gender: Female PCP Fax : [phone] Patient DOB: Jun 24, 1928 Practice Name: [facility] Reason for Call: Caller: Nyleah/Patient; PCP: Tillman Abide I.; CB#: [phone]; Call regarding Rash/Hives; onset 10/24/11 of rash/shingles on her left in the back area 12 x 12, bright red, itching, and discomfort, also two other smaller areas. Symptoms reviewed Rash Guideline, used vaseline. With lactose intolerarance, ate something with milk 10/23/11. Completed Shingles vaccine. Symptoms reviewed Rash Guideline, advised with concern of need to be seen within 24 hrs; appt offered and accepted 10/25/11 @ 0945 with Dr Patsy Lager. Protocol(s) Used: Rash Recommended Outcome per Protocol: See Provider within 24 hours Reason for Outcome: Burning or tingling feeling, itchiness or stabbing pain in a localized area on one side of body followed by reddened fluid-filled blisters that break and crust Care Advice: ~ Take a cool bath/shower to relieve itching. Do not use hot water as this may aggravate itching. See provider immediately if the blisters/rash is on the face, nose or eye area. Shingles of the eye can have serious complications including vision loss. ~ ~ Keep lesions clean using mild soap and water. Wash hands frequently. 10/24/2011 5:16:38PM Page 1 of 1 [id number]

## 2011-11-15 ENCOUNTER — Telehealth: Payer: Self-pay | Admitting: *Deleted

## 2011-11-15 NOTE — Telephone Encounter (Signed)
Received fax from [facility] stating that PA is needed for Ondansetron 4mg  tablet.  Called [employer] at [phone] and they will send forms today.

## 2011-11-17 ENCOUNTER — Telehealth: Payer: Self-pay | Admitting: Internal Medicine

## 2011-11-17 NOTE — Telephone Encounter (Signed)
Form done 

## 2011-11-17 NOTE — Telephone Encounter (Signed)
Form faxed back and scanned 

## 2011-11-17 NOTE — Telephone Encounter (Signed)
[employer] called, Ondansetron medication approved for 1 year, effective 11-17-2011- Approval ltr to follow..Jennifer Roman

## 2011-11-17 NOTE — Telephone Encounter (Signed)
PA form in your IN box. 

## 2011-11-20 NOTE — Telephone Encounter (Signed)
Thanks will let pharmacy know.

## 2011-11-29 ENCOUNTER — Ambulatory Visit (INDEPENDENT_AMBULATORY_CARE_PROVIDER_SITE_OTHER): Payer: Medicare Other | Admitting: Internal Medicine

## 2011-11-29 ENCOUNTER — Encounter: Payer: Self-pay | Admitting: Internal Medicine

## 2011-11-29 VITALS — BP 140/70 | HR 63 | Temp 98.4°F | Ht 60.0 in | Wt 128.0 lb

## 2011-11-29 DIAGNOSIS — I10 Essential (primary) hypertension: Secondary | ICD-10-CM

## 2011-11-29 DIAGNOSIS — G479 Sleep disorder, unspecified: Secondary | ICD-10-CM

## 2011-11-29 DIAGNOSIS — D509 Iron deficiency anemia, unspecified: Secondary | ICD-10-CM

## 2011-11-29 DIAGNOSIS — M1990 Unspecified osteoarthritis, unspecified site: Secondary | ICD-10-CM

## 2011-11-29 DIAGNOSIS — G478 Other sleep disorders: Secondary | ICD-10-CM

## 2011-11-29 DIAGNOSIS — J45909 Unspecified asthma, uncomplicated: Secondary | ICD-10-CM

## 2011-11-29 LAB — CBC WITH DIFFERENTIAL/PLATELET
Basophils Absolute: 0.1 10*3/uL (ref 0.0–0.1)
Eosinophils Absolute: 0.1 10*3/uL (ref 0.0–0.7)
Lymphocytes Relative: 28.5 % (ref 12.0–46.0)
Lymphs Abs: 2.7 10*3/uL (ref 0.7–4.0)
MCHC: 32.7 g/dL (ref 30.0–36.0)
Monocytes Relative: 8.1 % (ref 3.0–12.0)
Neutro Abs: 5.7 10*3/uL (ref 1.4–7.7)
Platelets: 281 10*3/uL (ref 150.0–400.0)
RDW: 16.9 % — ABNORMAL HIGH (ref 11.5–14.6)

## 2011-11-29 LAB — BASIC METABOLIC PANEL
BUN: 15 mg/dL (ref 6–23)
CO2: 29 mEq/L (ref 19–32)
Calcium: 9.8 mg/dL (ref 8.4–10.5)
GFR: 61.46 mL/min (ref >60.00)
Glucose, Bld: 97 mg/dL (ref 70–99)

## 2011-11-29 LAB — HEPATIC FUNCTION PANEL
AST: 38 U/L — ABNORMAL HIGH (ref 0–37)
Albumin: 4.5 g/dL (ref 3.5–5.2)
Alkaline Phosphatase: 67 U/L (ref 39–117)
Total Bilirubin: 0.4 mg/dL (ref 0.3–1.2)

## 2011-11-29 LAB — FERRITIN: Ferritin: 10.8 ng/mL (ref 10.0–291.0)

## 2011-11-29 NOTE — Assessment & Plan Note (Signed)
BP Readings from Last 3 Encounters:  11/29/11 140/70  10/25/11 140/90  05/31/11 137/52   Good control No changes needed

## 2011-11-29 NOTE — Patient Instructions (Signed)
Please try cerave cream after you bathe Try to limit the use of the triamcinolone cream

## 2011-11-29 NOTE — Assessment & Plan Note (Signed)
Better now 

## 2011-11-29 NOTE — Assessment & Plan Note (Signed)
Discussed limiting the [place]

## 2011-11-29 NOTE — Assessment & Plan Note (Signed)
Seems better Will check labs

## 2011-11-29 NOTE — Assessment & Plan Note (Signed)
Quiet on the theo

## 2011-11-29 NOTE — Progress Notes (Signed)
Subjective:    Patient ID: Jennifer Roman, female    DOB: 09/08/1928, 78 y.o.   MRN: [id number]  HPI "I have some issues" Triamcinolone has helped the rash Now gets red place when shoes are tight, briefly when she washes her face, etc Fingernails have been peeling some Hair is shedding more than usual  Stopped her iron about 1 month ago ?related to skin issues  Has noted "white noise" in her ears Started ~1 month ago Has worsened now to ringing. Mostly noticeable at night Hearing is fine Has vertigo occ when she lies down Has tried OTC "tinnitus relief" Plans to get check with audiologist at [facility]. Uses ondansetron with success if she has nausea with the symptoms  Has healed from hip surgery Still has some aching there though Using "[place]" sandals and these have helped Just uses tylenol now  Current Outpatient Prescriptions on File Prior to Visit  Medication Sig Dispense Refill  . acetaminophen (TYLENOL) 500 MG tablet Take 500 mg by mouth every 6 (six) hours as needed.        Marland Kitchen albuterol (PROVENTIL,VENTOLIN) 90 MCG/ACT inhaler Inhale 2 puffs into the lungs every 6 (six) hours as needed.  17 g  1  . aspirin EC 81 MG EC tablet Take 81 mg by mouth daily.        Marland Kitchen atorvastatin (LIPITOR) 10 MG tablet Take 1 tablet (10 mg total) by mouth at bedtime.  30 tablet  11  . budesonide-formoterol (SYMBICORT) 80-4.5 MCG/ACT inhaler Inhale 2 puffs into the lungs 2 (two) times daily.        . calcium citrate-vitamin D (CITRACAL+D) 315-200 MG-UNIT per tablet Take 1 tablet by mouth 2 (two) times daily.        . cetirizine (ZYRTEC) 10 MG tablet Take 10 mg by mouth daily.        Marland Kitchen dexlansoprazole (DEXILANT) 60 MG capsule Take 1 capsule (60 mg total) by mouth daily.  30 capsule  11  . losartan (COZAAR) 100 MG tablet Take 1 tablet (100 mg total) by mouth daily.  30 tablet  6  . mometasone (NASONEX) 50 MCG/ACT nasal spray 2 sprays by Nasal route 2 (two) times daily.        . ondansetron  (ZOFRAN-ODT) 4 MG disintegrating tablet Take 1 tablet (4 mg total) by mouth every 12 (twelve) hours as needed.  30 tablet  1  . polyethylene glycol (MIRALAX / GLYCOLAX) packet Take 17 g by mouth daily.        . sodium chloride (OCEAN) 0.65 % nasal spray 1 spray by Nasal route as needed.        . theophylline (THEO-24) 200 MG 24 hr capsule Take 1 capsule (200 mg total) by mouth daily.  30 capsule  11  . tolterodine (DETROL LA) 4 MG 24 hr capsule Take 1 capsule (4 mg total) by mouth at bedtime.  30 capsule  6  . triamcinolone cream (KENALOG) 0.1 % Apply topically 2 (two) times daily.  454 g  0  . zolpidem (AMBIEN) 5 MG tablet Take 1 tablet (5 mg total) by mouth at bedtime as needed.  30 tablet  1    Allergies  Allergen Reactions  . Phenergan     Psychotic reaction----tolerates zofran  . Scopolamine     Rash to pills    Past Medical History  Diagnosis Date  . Hypertension   . Asthma   . Vertigo     chronic  . Allergy   .  Colonic polyp   . Cancer     lung  . Urinary incontinence   . Hyperlipidemia   . GERD (gastroesophageal reflux disease)   . Osteoporosis     osteoarthritis    Past Surgical History  Procedure Date  . Esophageal hernia 01/2007    repaired x 4  . Bladder repair   . Eye surgery     cataract extraction, bilateral  . Vaginal hysterectomy   . Appendectomy   . Cholecystectomy   . Joint replacement 02/2010    Left THR  . Joint replacement 5/12    Right THR--Dr Hooten    Family History  Problem Relation Age of Onset  . Stroke Father   . Pulmonary embolism Father     History   Social History  . Marital Status: Widowed    Spouse Name: N/A    Number of Children: 4  . Years of Education: N/A   Occupational History  . Retired, [employer]    Social History Main Topics  . Smoking status: Never Smoker   . Smokeless tobacco: Never Used  . Alcohol Use: No  . Drug Use: No  . Sexually Active: Not on file   Other Topics Concern  . Not on  file   Social History Narrative  . No narrative on file   Review of Systems Sleeps well with ambien--7 hours and refreshed after using Appetite is fine     Objective:   Physical Exam  Constitutional: She appears well-developed and well-nourished. No distress.  HENT:       TMs obscurred with cerumen  Eyes: Conjunctivae and EOM are normal. Pupils are equal, round, and reactive to light.       Fundi benign  Neck: Normal range of motion.  Cardiovascular: Normal rate, regular rhythm and normal heart sounds.  Exam reveals no gallop.   No murmur heard. Pulmonary/Chest: Breath sounds normal. No respiratory distress. She has no wheezes. She has no rales.  Musculoskeletal: She exhibits no edema and no tenderness.  Lymphadenopathy:    She has no cervical adenopathy.  Psychiatric: She has a normal mood and affect. Her behavior is normal.       Mild anxiety--slightly pressured with all her issues Not depressed          Assessment & Plan:

## 2011-12-04 ENCOUNTER — Encounter: Payer: Self-pay | Admitting: *Deleted

## 2012-01-04 ENCOUNTER — Other Ambulatory Visit: Payer: Self-pay | Admitting: *Deleted

## 2012-01-04 MED ORDER — TOLTERODINE TARTRATE ER 4 MG PO CP24: 4.0000 mg | ORAL_CAPSULE | Freq: Every day | ORAL | Status: DC

## 2012-01-09 ENCOUNTER — Other Ambulatory Visit: Payer: Self-pay | Admitting: *Deleted

## 2012-01-09 MED ORDER — MOMETASONE FUROATE 50 MCG/ACT NA SUSP: 2.0000 | Freq: Two times a day (BID) | NASAL | Status: DC

## 2012-02-06 ENCOUNTER — Other Ambulatory Visit: Payer: Self-pay | Admitting: *Deleted

## 2012-02-06 MED ORDER — LOSARTAN POTASSIUM 100 MG PO TABS: 100.0000 mg | ORAL_TABLET | Freq: Every day | ORAL | Status: DC

## 2012-02-07 ENCOUNTER — Other Ambulatory Visit: Payer: Self-pay | Admitting: *Deleted

## 2012-02-07 MED ORDER — BUDESONIDE-FORMOTEROL FUMARATE 80-4.5 MCG/ACT IN AERO: 2.0000 | INHALATION_SPRAY | Freq: Two times a day (BID) | RESPIRATORY_TRACT | Status: DC

## 2012-04-24 ENCOUNTER — Ambulatory Visit: Payer: Medicare Other | Admitting: Family Medicine

## 2012-04-24 DIAGNOSIS — G479 Sleep disorder, unspecified: Secondary | ICD-10-CM

## 2012-04-24 DIAGNOSIS — M1990 Unspecified osteoarthritis, unspecified site: Secondary | ICD-10-CM

## 2012-04-24 DIAGNOSIS — L723 Sebaceous cyst: Secondary | ICD-10-CM

## 2012-04-24 NOTE — Progress Notes (Signed)
Subjective:    Patient ID: Jennifer Roman, female    DOB: 24-Feb-1928, 78 y.o.   MRN: [id number]      78 yo here to discuss insomnia.  Has tried Tylenol PM- causes grogginess the next day. Also has tried Trazadone and melatonin without success.  She was previously taking ambien 5 mg nightly as needed which helped but has not taken it since February.  She is here today to ask me my opinion about Ambien.  Patient Active Problem List  Diagnosis  . HYPERLIPIDEMIA  . HYPERTENSION  . ALLERGIC RHINITIS  . ASTHMA  . GERD  . SEBACEOUS CYST  . OSTEOARTHRITIS  . OSTEOPOROSIS  . SLEEP DISORDER  . URINARY INCONTINENCE  . LUNG CANCER, HX OF  . COLONIC POLYPS, HX OF  . Iron deficiency anemia   Past Medical History  Diagnosis Date  . Hypertension   . Asthma   . Vertigo     chronic  . Allergy   . Colonic polyp   . Cancer     lung  . Urinary incontinence   . Hyperlipidemia   . GERD (gastroesophageal reflux disease)   . Osteoporosis     osteoarthritis   Past Surgical History  Procedure Date  . Esophageal hernia 01/2007    repaired x 4  . Bladder repair   . Eye surgery     cataract extraction, bilateral  . Vaginal hysterectomy   . Appendectomy   . Cholecystectomy   . Joint replacement 02/2010    Left THR  . Joint replacement 5/12    Right THR--Dr Hooten   History  Substance Use Topics  . Smoking status: Never Smoker   . Smokeless tobacco: Never Used  . Alcohol Use: No   Family History  Problem Relation Age of Onset  . Stroke Father   . Pulmonary embolism Father    Allergies  Allergen Reactions  . Promethazine Hcl     Psychotic reaction----tolerates zofran  . Scopolamine     Rash to pills   Current Outpatient Prescriptions on File Prior to Visit  Medication Sig Dispense Refill  . acetaminophen (TYLENOL) 500 MG tablet Take 500 mg by mouth every 6 (six) hours as needed.        Marland Kitchen albuterol (PROVENTIL,VENTOLIN) 90 MCG/ACT inhaler Inhale 2 puffs into the lungs  every 6 (six) hours as needed.  17 g  1  . aspirin EC 81 MG EC tablet Take 81 mg by mouth daily.        Marland Kitchen atorvastatin (LIPITOR) 10 MG tablet Take 1 tablet (10 mg total) by mouth at bedtime.  30 tablet  11  . budesonide-formoterol (SYMBICORT) 80-4.5 MCG/ACT inhaler Inhale 2 puffs into the lungs 2 (two) times daily.  1 Inhaler  11  . calcium citrate-vitamin D (CITRACAL+D) 315-200 MG-UNIT per tablet Take 1 tablet by mouth 2 (two) times daily.        . cetirizine (ZYRTEC) 10 MG tablet Take 10 mg by mouth daily.        Marland Kitchen dexlansoprazole (DEXILANT) 60 MG capsule Take 1 capsule (60 mg total) by mouth daily.  30 capsule  11  . losartan (COZAAR) 100 MG tablet Take 1 tablet (100 mg total) by mouth daily.  30 tablet  6  . mometasone (NASONEX) 50 MCG/ACT nasal spray Place 2 sprays into the nose 2 (two) times daily.  17 g  6  . Multiple Vitamins-Minerals (MULTIVITAMIN GUMMIES ADULT) CHEW Chew 1 capsule by mouth daily.      Marland Kitchen  ondansetron (ZOFRAN-ODT) 4 MG disintegrating tablet Take 1 tablet (4 mg total) by mouth every 12 (twelve) hours as needed.  30 tablet  1  . polyethylene glycol (MIRALAX / GLYCOLAX) packet Take 17 g by mouth daily.        . sodium chloride (OCEAN) 0.65 % nasal spray 1 spray by Nasal route as needed.        . theophylline (THEO-24) 200 MG 24 hr capsule Take 1 capsule (200 mg total) by mouth daily.  30 capsule  11  . tolterodine (DETROL LA) 4 MG 24 hr capsule Take 1 capsule (4 mg total) by mouth at bedtime.  30 capsule  6  . triamcinolone cream (KENALOG) 0.1 % Apply topically 2 (two) times daily.  454 g  0  . zolpidem (AMBIEN) 5 MG tablet Take 1 tablet (5 mg total) by mouth at bedtime as needed.  30 tablet  1   The PMH, PSH, Social History, Family History, Medications, and allergies have been reviewed in [facility], and have been updated if relevant.   The PMH, PSH, Social History, Family History, Medications, and allergies have been reviewed in [facility], and have been updated if relevant.   Review  of Systems       See HPI General:  Denies fever. GU:  Denies dysuria, incontinence, and urinary frequency. MS:  Denies muscle weakness.  Physical Exam  General:  alert and normal appearance.   Psych:  normally interactive, good eye contact, mildly anxious appearing, and not depressed appearing.    Assessment and Plan: 1. SLEEP DISORDER    >30 min spent with face to face with patient, >50% counseling and/or coordinating care. Discussed sleep hygiene she feels she cannot improve on this and already has excellent sleep hygiene. Discussed trying melatonin again- perhaps at higher dose- she is concerned about GI side effects. I discussed addiction and sedation potential with Remus Loffler, particularly with her age.  She is aware and feels she tolerates ok. I advised discussing this further with her PCP at her wellness visit next month.  I suggested perhaps not using ambien nightly but just as needed and using melatonin or tylenol PM other evenings. The patient indicates understanding of these issues and agrees with the plan.

## 2012-05-31 ENCOUNTER — Ambulatory Visit (INDEPENDENT_AMBULATORY_CARE_PROVIDER_SITE_OTHER): Payer: Medicare Other | Admitting: Internal Medicine

## 2012-05-31 ENCOUNTER — Encounter: Payer: Self-pay | Admitting: Internal Medicine

## 2012-05-31 VITALS — BP 148/72 | HR 65 | Temp 97.7°F | Ht 60.0 in | Wt 129.0 lb

## 2012-05-31 DIAGNOSIS — G479 Sleep disorder, unspecified: Secondary | ICD-10-CM

## 2012-05-31 DIAGNOSIS — Z23 Encounter for immunization: Secondary | ICD-10-CM

## 2012-05-31 DIAGNOSIS — K219 Gastro-esophageal reflux disease without esophagitis: Secondary | ICD-10-CM

## 2012-05-31 DIAGNOSIS — I10 Essential (primary) hypertension: Secondary | ICD-10-CM

## 2012-05-31 DIAGNOSIS — J45909 Unspecified asthma, uncomplicated: Secondary | ICD-10-CM

## 2012-05-31 DIAGNOSIS — E785 Hyperlipidemia, unspecified: Secondary | ICD-10-CM

## 2012-05-31 LAB — HEPATIC FUNCTION PANEL
AST: 55 U/L — ABNORMAL HIGH (ref 0–37)
Albumin: 4.6 g/dL (ref 3.5–5.2)
Total Bilirubin: 0.9 mg/dL (ref 0.3–1.2)

## 2012-05-31 LAB — CBC WITH DIFFERENTIAL/PLATELET
Basophils Absolute: 0 10*3/uL (ref 0.0–0.1)
Eosinophils Absolute: 0.2 10*3/uL (ref 0.0–0.7)
HCT: 41.1 % (ref 36.0–46.0)
Lymphs Abs: 2.3 10*3/uL (ref 0.7–4.0)
MCHC: 32.8 g/dL (ref 30.0–36.0)
MCV: 91.1 fl (ref 78.0–100.0)
Monocytes Absolute: 0.5 10*3/uL (ref 0.1–1.0)
Neutrophils Relative %: 63 % (ref 43.0–77.0)
Platelets: 272 10*3/uL (ref 150.0–400.0)
RDW: 13.5 % (ref 11.5–14.6)

## 2012-05-31 LAB — TSH: TSH: 2.5 u[IU]/mL (ref 0.35–5.50)

## 2012-05-31 LAB — BASIC METABOLIC PANEL
BUN: 10 mg/dL (ref 6–23)
Chloride: 105 mEq/L (ref 96–112)
Creatinine, Ser: 0.8 mg/dL (ref 0.4–1.2)
Glucose, Bld: 112 mg/dL — ABNORMAL HIGH (ref 70–99)
Potassium: 4.4 mEq/L (ref 3.5–5.1)

## 2012-05-31 LAB — LDL CHOLESTEROL, DIRECT: Direct LDL: 65.5 mg/dL

## 2012-05-31 LAB — LIPID PANEL
Triglycerides: 206 mg/dL — ABNORMAL HIGH (ref 0.0–149.0)
VLDL: 41.2 mg/dL — ABNORMAL HIGH (ref 0.0–40.0)

## 2012-05-31 MED ORDER — ZOLPIDEM TARTRATE 5 MG PO TABS: 5.0000 mg | ORAL_TABLET | Freq: Every evening | ORAL | Status: DC | PRN

## 2012-05-31 NOTE — Assessment & Plan Note (Signed)
BP Readings from Last 3 Encounters:  05/31/12 148/72  11/29/11 140/70  10/25/11 140/90  has had good control No changes for now

## 2012-05-31 NOTE — Assessment & Plan Note (Signed)
Will refill the ambien for occ use only

## 2012-05-31 NOTE — Assessment & Plan Note (Signed)
Mild intermittent symptoms Discussed to make sure she was not limiting her activity If any persistent symptoms, will restart regular symbicort

## 2012-05-31 NOTE — Assessment & Plan Note (Signed)
Quiet on the med Needs to continue the PPI

## 2012-05-31 NOTE — Assessment & Plan Note (Signed)
No side effects Will recheck labs

## 2012-05-31 NOTE — Progress Notes (Signed)
Subjective:    Patient ID: Jennifer Roman, female    DOB: July 18, 1928, 78 y.o.   MRN: [id number]  HPI Doing well Both hips repaired and she feels back to herself  Digestive process is better Bowels fairly regular Appetite is good Dexilant still controlling esophageal symptoms Eats well---son is gourmet cook and brings her prepared meals  Ongoing sleep problems Side effects with benedryl Melatonin didn't help Zolpidem only thing that has helped  Asthma quiet now Only prn use of any inhalers On the theophylline still No sig cough or SOB Not much exercise but is active at home and tries to walk  No chest pain No palpitations  Ongoing back pain from lumbar stenosis Better with new orthopedic shoe and daily tylenol  Current Outpatient Prescriptions on File Prior to Visit  Medication Sig Dispense Refill  . acetaminophen (TYLENOL) 500 MG tablet Take 500 mg by mouth as needed.       Marland Kitchen albuterol (PROVENTIL,VENTOLIN) 90 MCG/ACT inhaler Inhale 2 puffs into the lungs every 6 (six) hours as needed.  17 g  1  . aspirin EC 81 MG EC tablet Take 81 mg by mouth daily.        Marland Kitchen atorvastatin (LIPITOR) 10 MG tablet Take 1 tablet (10 mg total) by mouth at bedtime.  30 tablet  11  . calcium citrate-vitamin D (CITRACAL+D) 315-200 MG-UNIT per tablet Take 1 tablet by mouth 2 (two) times daily.        . cetirizine (ZYRTEC) 10 MG tablet Take 10 mg by mouth daily.        Marland Kitchen dexlansoprazole (DEXILANT) 60 MG capsule Take 1 capsule (60 mg total) by mouth daily.  30 capsule  11  . losartan (COZAAR) 100 MG tablet Take 1 tablet (100 mg total) by mouth daily.  30 tablet  6  . Multiple Vitamins-Minerals (MULTIVITAMIN GUMMIES ADULT) CHEW Chew 1 capsule by mouth daily.      . ondansetron (ZOFRAN-ODT) 4 MG disintegrating tablet Take 1 tablet (4 mg total) by mouth every 12 (twelve) hours as needed.  30 tablet  1  . polyethylene glycol (MIRALAX / GLYCOLAX) packet Take 17 g by mouth as needed.       . sodium  chloride (OCEAN) 0.65 % nasal spray 1 spray by Nasal route as needed.        . theophylline (THEO-24) 200 MG 24 hr capsule Take 1 capsule (200 mg total) by mouth daily.  30 capsule  11  . tolterodine (DETROL LA) 4 MG 24 hr capsule Take 1 capsule (4 mg total) by mouth at bedtime.  30 capsule  6  . zolpidem (AMBIEN) 5 MG tablet Take 1 tablet (5 mg total) by mouth at bedtime as needed.  30 tablet  1  . DISCONTD: budesonide-formoterol (SYMBICORT) 80-4.5 MCG/ACT inhaler Inhale 2 puffs into the lungs 2 (two) times daily.  1 Inhaler  11  . DISCONTD: mometasone (NASONEX) 50 MCG/ACT nasal spray Place 2 sprays into the nose 2 (two) times daily.  17 g  6    Allergies  Allergen Reactions  . Promethazine Hcl     Psychotic reaction----tolerates zofran  . Scopolamine     Rash to pills    Past Medical History  Diagnosis Date  . Hypertension   . Asthma   . Vertigo     chronic  . Allergy   . Colonic polyp   . Cancer     lung  . Urinary incontinence   . Hyperlipidemia   .  GERD (gastroesophageal reflux disease)   . Osteoporosis     osteoarthritis    Past Surgical History  Procedure Date  . Esophageal hernia 01/2007    repaired x 4  . Bladder repair   . Eye surgery     cataract extraction, bilateral  . Vaginal hysterectomy   . Appendectomy   . Cholecystectomy   . Joint replacement 02/2010    Left THR  . Joint replacement 5/12    Right THR--Dr Hooten    Family History  Problem Relation Age of Onset  . Stroke Father   . Pulmonary embolism Father     History   Social History  . Marital Status: Widowed    Spouse Name: N/A    Number of Children: 4  . Years of Education: N/A   Occupational History  . Retired, [employer]    Social History Main Topics  . Smoking status: Never Smoker   . Smokeless tobacco: Never Used  . Alcohol Use: No  . Drug Use: No  . Sexually Active: Not on file   Other Topics Concern  . Not on file   Social History Narrative  . No  narrative on file   Review of Systems Has put on some weight--feels better Appetite is good Bowels are okay    Objective:   Physical Exam  Constitutional: She appears well-developed and well-nourished. No distress.  Neck: Normal range of motion. Neck supple. No thyromegaly present.  Cardiovascular: Normal rate, regular rhythm, normal heart sounds and intact distal pulses.  Exam reveals no gallop.   No murmur heard. Pulmonary/Chest: Effort normal and breath sounds normal. No respiratory distress. She has no wheezes. She has no rales.  Abdominal: Soft. There is no tenderness.  Musculoskeletal: She exhibits no edema and no tenderness.  Lymphadenopathy:    She has no cervical adenopathy.  Psychiatric: She has a normal mood and affect. Her behavior is normal.          Assessment & Plan:

## 2012-06-01 LAB — THEOPHYLLINE LEVEL: Theophylline Lvl: 8.5 ug/mL — ABNORMAL LOW (ref 10.0–20.0)

## 2012-06-03 ENCOUNTER — Encounter: Payer: Self-pay | Admitting: *Deleted

## 2012-06-05 ENCOUNTER — Other Ambulatory Visit: Payer: Self-pay | Admitting: *Deleted

## 2012-06-05 MED ORDER — THEOPHYLLINE ER 200 MG PO CP24: 200.0000 mg | ORAL_CAPSULE | Freq: Every day | ORAL | Status: DC

## 2012-06-05 MED ORDER — ATORVASTATIN CALCIUM 10 MG PO TABS: 10.0000 mg | ORAL_TABLET | Freq: Every day | ORAL | Status: DC

## 2012-08-06 ENCOUNTER — Other Ambulatory Visit: Payer: Self-pay | Admitting: *Deleted

## 2012-08-06 MED ORDER — DEXLANSOPRAZOLE 60 MG PO CPDR: 60.0000 mg | DELAYED_RELEASE_CAPSULE | Freq: Every day | ORAL | Status: DC

## 2012-08-06 MED ORDER — TOLTERODINE TARTRATE ER 4 MG PO CP24: 4.0000 mg | ORAL_CAPSULE | Freq: Every day | ORAL | Status: DC

## 2012-09-05 ENCOUNTER — Other Ambulatory Visit: Payer: Self-pay | Admitting: Internal Medicine

## 2012-09-05 ENCOUNTER — Other Ambulatory Visit: Payer: Self-pay | Admitting: *Deleted

## 2012-09-05 MED ORDER — LOSARTAN POTASSIUM 100 MG PO TABS: 100.0000 mg | ORAL_TABLET | Freq: Every day | ORAL | Status: DC

## 2012-11-29 ENCOUNTER — Other Ambulatory Visit: Payer: Self-pay | Admitting: *Deleted

## 2012-11-29 MED ORDER — LOSARTAN POTASSIUM 100 MG PO TABS: 100.0000 mg | ORAL_TABLET | Freq: Every day | ORAL | Status: DC

## 2012-11-29 MED ORDER — ONDANSETRON 4 MG PO TBDP: 4.0000 mg | ORAL_TABLET | Freq: Two times a day (BID) | ORAL | Status: DC | PRN

## 2012-12-02 ENCOUNTER — Ambulatory Visit (INDEPENDENT_AMBULATORY_CARE_PROVIDER_SITE_OTHER): Payer: Medicare Other | Admitting: Internal Medicine

## 2012-12-02 ENCOUNTER — Encounter: Payer: Self-pay | Admitting: Internal Medicine

## 2012-12-02 VITALS — BP 150/80 | HR 68 | Temp 97.5°F | Wt 130.0 lb

## 2012-12-02 DIAGNOSIS — Z1331 Encounter for screening for depression: Secondary | ICD-10-CM

## 2012-12-02 DIAGNOSIS — G479 Sleep disorder, unspecified: Secondary | ICD-10-CM

## 2012-12-02 DIAGNOSIS — I10 Essential (primary) hypertension: Secondary | ICD-10-CM

## 2012-12-02 DIAGNOSIS — J45909 Unspecified asthma, uncomplicated: Secondary | ICD-10-CM

## 2012-12-02 DIAGNOSIS — E785 Hyperlipidemia, unspecified: Secondary | ICD-10-CM

## 2012-12-02 LAB — HEPATIC FUNCTION PANEL
Albumin: 4.6 g/dL (ref 3.5–5.2)
Alkaline Phosphatase: 77 U/L (ref 39–117)
Total Protein: 7.6 g/dL (ref 6.0–8.3)

## 2012-12-02 NOTE — Assessment & Plan Note (Signed)
Has been quiet Only theophylline regularly

## 2012-12-02 NOTE — Progress Notes (Signed)
Subjective:    Patient ID: Genesia Caslin Raborn, female    DOB: 11-18-1927, 79 y.o.   MRN: [id number]  HPI Doing "wonderful" Great year last year--didn't need any surgery Has been sleeping well lately but only rarely uses the zolpidem now  Has been doing some exercise Tries to walk fast and be generally active--no formal exercise program  Has ongoing vertigo---worst when lying down and getting out of bed Will have periods of nausea with this that only the ondansetron helps (the sublingual is the only thing that will work when it hits) Can't take phenergan  No chest pain No SOB No edema No dizziness when up (just postural---like bending to get hair washed)  Asthma has been quiet No regular cough Breathing is good Not on the symbicort regularly  Current Outpatient Prescriptions on File Prior to Visit  Medication Sig Dispense Refill  . acetaminophen (TYLENOL) 500 MG tablet Take 500 mg by mouth as needed.       Marland Kitchen albuterol (PROVENTIL,VENTOLIN) 90 MCG/ACT inhaler Inhale 2 puffs into the lungs every 6 (six) hours as needed.  17 g  1  . aspirin EC 81 MG EC tablet Take 81 mg by mouth daily.        Marland Kitchen atorvastatin (LIPITOR) 10 MG tablet Take 1 tablet (10 mg total) by mouth at bedtime.  30 tablet  11  . budesonide-formoterol (SYMBICORT) 80-4.5 MCG/ACT inhaler Inhale 2 puffs into the lungs 2 (two) times daily as needed.      . calcium citrate-vitamin D (CITRACAL+D) 315-200 MG-UNIT per tablet Take 1 tablet by mouth 2 (two) times daily.        . cetirizine (ZYRTEC) 10 MG tablet Take 10 mg by mouth daily.        Marland Kitchen dexlansoprazole (DEXILANT) 60 MG capsule Take 1 capsule (60 mg total) by mouth daily.  30 capsule  6  . losartan (COZAAR) 100 MG tablet Take 1 tablet (100 mg total) by mouth daily.  30 tablet  11  . mometasone (NASONEX) 50 MCG/ACT nasal spray Place 2 sprays into the nose as needed.      . Multiple Vitamins-Minerals (MULTIVITAMIN GUMMIES ADULT) CHEW Chew 1 capsule by mouth daily.      .  ondansetron (ZOFRAN-ODT) 4 MG disintegrating tablet Take 1 tablet (4 mg total) by mouth every 12 (twelve) hours as needed.  30 tablet  1  . polyethylene glycol (MIRALAX / GLYCOLAX) packet Take 17 g by mouth as needed.       . sodium chloride (OCEAN) 0.65 % nasal spray 1 spray by Nasal route as needed.        . theophylline (THEO-24) 200 MG 24 hr capsule Take 1 capsule (200 mg total) by mouth daily.  30 capsule  11  . tolterodine (DETROL LA) 4 MG 24 hr capsule Take 1 capsule (4 mg total) by mouth at bedtime.  30 capsule  6  . zolpidem (AMBIEN) 5 MG tablet Take 1 tablet (5 mg total) by mouth at bedtime as needed.  30 tablet  1  . [DISCONTINUED] tolterodine (DETROL LA) 4 MG 24 hr capsule Take 1 capsule (4 mg total) by mouth at bedtime.  30 capsule  6   No current facility-administered medications on file prior to visit.    Allergies  Allergen Reactions  . Promethazine Hcl     Psychotic reaction----tolerates zofran  . Scopolamine     Rash to pills    Past Medical History  Diagnosis Date  . Hypertension   .  Asthma   . Vertigo     chronic  . Allergy   . Colonic polyp   . Cancer     lung  . Urinary incontinence   . Hyperlipidemia   . GERD (gastroesophageal reflux disease)   . Osteoporosis     osteoarthritis    Past Surgical History  Procedure Laterality Date  . Esophageal hernia  01/2007    repaired x 4  . Bladder repair    . Eye surgery      cataract extraction, bilateral  . Vaginal hysterectomy    . Appendectomy    . Cholecystectomy    . Joint replacement  02/2010    Left THR  . Joint replacement  5/12    Right THR--Dr Hooten    Family History  Problem Relation Age of Onset  . Stroke Father   . Pulmonary embolism Father     History   Social History  . Marital Status: Widowed    Spouse Name: N/A    Number of Children: 4  . Years of Education: N/A   Occupational History  . Retired, [employer]    Social History Main Topics  . Smoking status:  Never Smoker   . Smokeless tobacco: Never Used  . Alcohol Use: No  . Drug Use: No  . Sexually Active: Not on file   Other Topics Concern  . Not on file   Social History Narrative  . No narrative on file   Review of Systems Appetite is fine Weight is stable Lots of "drama" in family--- son with PTSD, etc Has constant "white noise" for some time Some pain --controlled with 1 tylenol Seems to be more cold intolerant lately    Objective:   Physical Exam  Constitutional: She appears well-developed and well-nourished.  Neck: Normal range of motion. Neck supple. No thyromegaly present.  Cardiovascular: Normal rate, regular rhythm and normal heart sounds.  Exam reveals no gallop.   No murmur heard. Pulmonary/Chest: Effort normal and breath sounds normal. No respiratory distress. She has no wheezes. She has no rales.  Musculoskeletal: She exhibits no edema and no tenderness.  Lymphadenopathy:    She has no cervical adenopathy.  Psychiatric: She has a normal mood and affect. Her behavior is normal.          Assessment & Plan:

## 2012-12-02 NOTE — Assessment & Plan Note (Signed)
Dealing with regular stress better Only occ uses the zolpidem

## 2012-12-02 NOTE — Assessment & Plan Note (Signed)
BP Readings from Last 3 Encounters:  12/02/12 150/80  05/31/12 148/72  11/29/11 140/70   Reasonable control for age No changes

## 2012-12-02 NOTE — Assessment & Plan Note (Signed)
Mild LFT elevations Will recheck Continue meds if not worse

## 2012-12-03 ENCOUNTER — Encounter: Payer: Self-pay | Admitting: *Deleted

## 2012-12-13 ENCOUNTER — Telehealth: Payer: Self-pay | Admitting: *Deleted

## 2012-12-13 NOTE — Telephone Encounter (Signed)
Form on your desk for prior auth for Ondansetron for patient

## 2012-12-13 NOTE — Telephone Encounter (Signed)
Form done 

## 2012-12-13 NOTE — Telephone Encounter (Signed)
Form faxed

## 2013-02-24 ENCOUNTER — Other Ambulatory Visit: Payer: Self-pay | Admitting: *Deleted

## 2013-02-24 MED ORDER — MOMETASONE FUROATE 50 MCG/ACT NA SUSP: 2.0000 | NASAL | Status: DC | PRN

## 2013-03-03 ENCOUNTER — Other Ambulatory Visit: Payer: Self-pay | Admitting: *Deleted

## 2013-03-03 MED ORDER — DEXLANSOPRAZOLE 60 MG PO CPDR: 60.0000 mg | DELAYED_RELEASE_CAPSULE | Freq: Every day | ORAL | Status: DC

## 2013-03-03 NOTE — Telephone Encounter (Signed)
Fax refill request.

## 2013-03-04 MED ORDER — ZOLPIDEM TARTRATE 5 MG PO TABS: 5.0000 mg | ORAL_TABLET | Freq: Every evening | ORAL | Status: DC | PRN

## 2013-03-04 NOTE — Telephone Encounter (Signed)
rx called into pharmacy

## 2013-03-04 NOTE — Telephone Encounter (Signed)
Okay #[phone] x 0 

## 2013-03-31 ENCOUNTER — Other Ambulatory Visit: Payer: Self-pay | Admitting: Internal Medicine

## 2013-04-30 ENCOUNTER — Other Ambulatory Visit: Payer: Self-pay | Admitting: Internal Medicine

## 2013-06-03 ENCOUNTER — Other Ambulatory Visit: Payer: Self-pay | Admitting: Internal Medicine

## 2013-06-04 ENCOUNTER — Other Ambulatory Visit: Payer: Self-pay | Admitting: Internal Medicine

## 2013-06-09 ENCOUNTER — Ambulatory Visit (INDEPENDENT_AMBULATORY_CARE_PROVIDER_SITE_OTHER): Payer: Medicare Other | Admitting: Internal Medicine

## 2013-06-09 ENCOUNTER — Encounter: Payer: Self-pay | Admitting: Internal Medicine

## 2013-06-09 VITALS — BP 130/80 | HR 76 | Temp 98.1°F | Ht 60.0 in | Wt 129.0 lb

## 2013-06-09 DIAGNOSIS — E785 Hyperlipidemia, unspecified: Secondary | ICD-10-CM

## 2013-06-09 DIAGNOSIS — Z0000 Encounter for general adult medical examination without abnormal findings: Secondary | ICD-10-CM | POA: Insufficient documentation

## 2013-06-09 DIAGNOSIS — I10 Essential (primary) hypertension: Secondary | ICD-10-CM

## 2013-06-09 DIAGNOSIS — G479 Sleep disorder, unspecified: Secondary | ICD-10-CM

## 2013-06-09 DIAGNOSIS — J45909 Unspecified asthma, uncomplicated: Secondary | ICD-10-CM

## 2013-06-09 DIAGNOSIS — Z23 Encounter for immunization: Secondary | ICD-10-CM

## 2013-06-09 NOTE — Assessment & Plan Note (Signed)
Rarely needs the zolpidem 

## 2013-06-09 NOTE — Assessment & Plan Note (Signed)
Doing well with primary prevention Will check labs

## 2013-06-09 NOTE — Assessment & Plan Note (Signed)
Has been quiet really just on the theophylline

## 2013-06-09 NOTE — Assessment & Plan Note (Signed)
BP Readings from Last 3 Encounters:  06/09/13 130/80  12/02/12 150/80  05/31/12 148/72   Good control No changes

## 2013-06-09 NOTE — Progress Notes (Signed)
Subjective:    Patient ID: Jennifer Roman, female    DOB: 1928/01/08, 79 y.o.   MRN: [id number]  HPI Here for Medicare wellness and follow up Reviewed advanced directives No other doctors except eye doctor Vision and hearing are okay Does walk regularly No falls No depression or anhedonia No sexual relationship No tobacco or alcohol No memory problems 1 fall---onto back while spraying wasp. No significant injury  No chest pain No SOB No headaches for the most part No dizziness or syncope  Asthma generally controlled Only takes the theophylline daily---- all other meds are prn Some congestion ---related to allergies  Still has "challenges" with abdominal pain after eating Bowels erratic---now better using just stool softener  Current Outpatient Prescriptions on File Prior to Visit  Medication Sig Dispense Refill  . acetaminophen (TYLENOL) 500 MG tablet Take 500 mg by mouth as needed.       Marland Kitchen albuterol (PROVENTIL,VENTOLIN) 90 MCG/ACT inhaler Inhale 2 puffs into the lungs every 6 (six) hours as needed.  17 g  1  . aspirin EC 81 MG EC tablet Take 81 mg by mouth daily.        Marland Kitchen atorvastatin (LIPITOR) 10 MG tablet Take 1 tablet (10 mg total) by mouth at bedtime.  30 tablet  11  . budesonide-formoterol (SYMBICORT) 80-4.5 MCG/ACT inhaler Inhale 2 puffs into the lungs 2 (two) times daily as needed.      . calcium citrate-vitamin D (CITRACAL+D) 315-200 MG-UNIT per tablet Take 1 tablet by mouth 2 (two) times daily.        . cetirizine (ZYRTEC) 10 MG tablet Take 10 mg by mouth daily.        Marland Kitchen dexlansoprazole (DEXILANT) 60 MG capsule Take 1 capsule (60 mg total) by mouth daily.  30 capsule  3  . losartan (COZAAR) 100 MG tablet Take 1 tablet (100 mg total) by mouth daily.  30 tablet  11  . mometasone (NASONEX) 50 MCG/ACT nasal spray Place 2 sprays into the nose as needed.  17 g  3  . Multiple Vitamins-Minerals (MULTIVITAMIN GUMMIES ADULT) CHEW Chew 1 capsule by mouth daily.      .  ondansetron (ZOFRAN-ODT) 4 MG disintegrating tablet Take 1 tablet (4 mg total) by mouth every 12 (twelve) hours as needed.  30 tablet  1  . polyethylene glycol (MIRALAX / GLYCOLAX) packet Take 17 g by mouth as needed.       . sodium chloride (OCEAN) 0.65 % nasal spray 1 spray by Nasal route as needed.        . theophylline (THEODUR) 200 MG 12 hr tablet TAKE ONE (1) TABLET BY MOUTH EVERY DAY  30 tablet  0  . tolterodine (DETROL LA) 4 MG 24 hr capsule TAKE ONE CAPSULE BY MOUTH EVERY NIGHT AT BEDTIME  30 capsule  0  . zolpidem (AMBIEN) 5 MG tablet Take 1 tablet (5 mg total) by mouth at bedtime as needed.  30 tablet  0  . [DISCONTINUED] tolterodine (DETROL LA) 4 MG 24 hr capsule Take 1 capsule (4 mg total) by mouth at bedtime.  30 capsule  6   No current facility-administered medications on file prior to visit.    Allergies  Allergen Reactions  . Promethazine Hcl     Psychotic reaction----tolerates zofran  . Scopolamine     Rash to pills    Past Medical History  Diagnosis Date  . Hypertension   . Asthma   . Vertigo     chronic  .  Allergy   . Colonic polyp   . Cancer     lung  . Urinary incontinence   . Hyperlipidemia   . GERD (gastroesophageal reflux disease)   . Osteoporosis     osteoarthritis    Past Surgical History  Procedure Laterality Date  . Esophageal hernia  01/2007    repaired x 4  . Bladder repair    . Eye surgery      cataract extraction, bilateral  . Vaginal hysterectomy    . Appendectomy    . Cholecystectomy    . Joint replacement  02/2010    Left THR  . Joint replacement  5/12    Right THR--Dr Hooten    Family History  Problem Relation Age of Onset  . Stroke Father   . Pulmonary embolism Father     History   Social History  . Marital Status: Widowed    Spouse Name: N/A    Number of Children: 4  . Years of Education: N/A   Occupational History  . Retired, [employer]    Social History Main Topics  . Smoking status: Never  Smoker   . Smokeless tobacco: Never Used  . Alcohol Use: No  . Drug Use: No  . Sexual Activity: Not on file   Other Topics Concern  . Not on file   Social History Narrative   Has living will   Son Chrissie Noa is health care POA.   Has DNR already.   No feeding tube if cognitively unaware         Review of Systems Still has white noise--especially notable at night. Hearing is okay though Generally sleeps okay---rarely uses the zolpidem Oldest son in [place] is dying---the alcoholic with PTSD and pituitary tumor. Ongoing stress with this Still gets easy vertigo--this limits her from travelling    Objective:   Physical Exam  Constitutional: She is oriented to person, place, and time. She appears well-developed and well-nourished. No distress.  HENT:  Mouth/Throat: Oropharynx is clear and moist. No oropharyngeal exudate.  Neck: Normal range of motion. Neck supple. No thyromegaly present.  Cardiovascular: Normal rate, regular rhythm, normal heart sounds and intact distal pulses.  Exam reveals no gallop.   No murmur heard. Pulmonary/Chest: Effort normal and breath sounds normal. No respiratory distress. She has no wheezes. She has no rales.  Abdominal: Soft. There is no tenderness.  Musculoskeletal: She exhibits no edema and no tenderness.  Lymphadenopathy:    She has no cervical adenopathy.  Neurological: She is alert and oriented to person, place, and time.  President-- "Obama, Felix Pacini, Ardyth Harps, the other Bush..... Then finally Clinton" [id number] D-l-r-o-w Recall 3/3  Psychiatric: She has a normal mood and affect. Her behavior is normal.          Assessment & Plan:

## 2013-06-09 NOTE — Addendum Note (Signed)
Addended by: Sueanne Margarita on: 06/09/2013 05:18 PM   Modules accepted: Orders

## 2013-06-09 NOTE — Assessment & Plan Note (Signed)
I have personally reviewed the Medicare Annual Wellness questionnaire and have noted 1. The patient's medical and social history 2. Their use of alcohol, tobacco or illicit drugs 3. Their current medications and supplements 4. The patient's functional ability including ADL's, fall risks, home safety risks and hearing or visual             impairment. 5. Diet and physical activities 6. Evidence for depression or mood disorders  The patients weight, height, BMI and visual acuity have been recorded in the chart I have made referrals, counseling and provided education to the patient based review of the above and I have provided the pt with a written personalized care plan for preventive services.  I have provided you with a copy of your personalized plan for preventive services. Please take the time to review along with your updated medication list.  Had DEXA years ago--calcium and vitamin D Flu vaccine given today No cancer screening

## 2013-06-10 LAB — CBC WITH DIFFERENTIAL/PLATELET
Basophils Relative: 0.4 % (ref 0.0–3.0)
Eosinophils Relative: 1.7 % (ref 0.0–5.0)
HCT: 42.1 % (ref 36.0–46.0)
Lymphs Abs: 2.6 10*3/uL (ref 0.7–4.0)
MCV: 90.2 fl (ref 78.0–100.0)
Monocytes Absolute: 0.6 10*3/uL (ref 0.1–1.0)
RBC: 4.67 Mil/uL (ref 3.87–5.11)
WBC: 9.7 10*3/uL (ref 4.5–10.5)

## 2013-06-10 LAB — LIPID PANEL
Cholesterol: 176 mg/dL (ref 0–200)
LDL Cholesterol: 72 mg/dL (ref 0–99)
Total CHOL/HDL Ratio: 2

## 2013-06-10 LAB — BASIC METABOLIC PANEL
BUN: 13 mg/dL (ref 6–23)
Chloride: 108 mEq/L (ref 96–112)
Potassium: 4.1 mEq/L (ref 3.5–5.1)

## 2013-06-10 LAB — HEPATIC FUNCTION PANEL
ALT: 28 U/L (ref 0–35)
Total Bilirubin: 0.7 mg/dL (ref 0.3–1.2)
Total Protein: 7.8 g/dL (ref 6.0–8.3)

## 2013-06-13 ENCOUNTER — Encounter: Payer: Self-pay | Admitting: *Deleted

## 2013-06-30 ENCOUNTER — Other Ambulatory Visit: Payer: Self-pay | Admitting: Internal Medicine

## 2013-07-30 ENCOUNTER — Other Ambulatory Visit: Payer: Self-pay | Admitting: Internal Medicine

## 2013-12-01 ENCOUNTER — Other Ambulatory Visit: Payer: Self-pay | Admitting: Internal Medicine

## 2014-06-11 ENCOUNTER — Ambulatory Visit (INDEPENDENT_AMBULATORY_CARE_PROVIDER_SITE_OTHER): Payer: Medicare Other | Admitting: Internal Medicine

## 2014-06-11 ENCOUNTER — Encounter: Payer: Self-pay | Admitting: Internal Medicine

## 2014-06-11 VITALS — BP 132/78 | HR 72 | Temp 97.9°F | Resp 14 | Ht 60.0 in | Wt 131.8 lb

## 2014-06-11 DIAGNOSIS — Z0000 Encounter for general adult medical examination without abnormal findings: Secondary | ICD-10-CM

## 2014-06-11 DIAGNOSIS — E785 Hyperlipidemia, unspecified: Secondary | ICD-10-CM

## 2014-06-11 DIAGNOSIS — Z7189 Other specified counseling: Secondary | ICD-10-CM | POA: Insufficient documentation

## 2014-06-11 DIAGNOSIS — K219 Gastro-esophageal reflux disease without esophagitis: Secondary | ICD-10-CM

## 2014-06-11 DIAGNOSIS — Z23 Encounter for immunization: Secondary | ICD-10-CM

## 2014-06-11 DIAGNOSIS — I10 Essential (primary) hypertension: Secondary | ICD-10-CM

## 2014-06-11 DIAGNOSIS — N3941 Urge incontinence: Secondary | ICD-10-CM

## 2014-06-11 DIAGNOSIS — J4520 Mild intermittent asthma, uncomplicated: Secondary | ICD-10-CM

## 2014-06-11 DIAGNOSIS — G479 Sleep disorder, unspecified: Secondary | ICD-10-CM

## 2014-06-11 NOTE — Addendum Note (Signed)
Addended by: Geni Bers on: 06/11/2014 04:13 PM   Modules accepted: Orders

## 2014-06-11 NOTE — Assessment & Plan Note (Signed)
BP Readings from Last 3 Encounters:  06/11/14 132/78  06/09/13 130/80  12/02/12 150/80   Good control No changes needed

## 2014-06-11 NOTE — Progress Notes (Signed)
Pre visit review using our clinic review tool, if applicable. No additional management support is needed unless otherwise documented below in the visit note. 

## 2014-06-11 NOTE — Assessment & Plan Note (Signed)
Happy with the detrol

## 2014-06-11 NOTE — Assessment & Plan Note (Signed)
Rarely needs the zolpidem

## 2014-06-11 NOTE — Assessment & Plan Note (Signed)
I have personally reviewed the Medicare Annual Wellness questionnaire and have noted 1. The patient's medical and social history 2. Their use of alcohol, tobacco or illicit drugs 3. Their current medications and supplements 4. The patient's functional ability including ADL's, fall risks, home safety risks and hearing or visual             impairment. 5. Diet and physical activities 6. Evidence for depression or mood disorders  The patients weight, height, BMI and visual acuity have been recorded in the chart I have made referrals, counseling and provided education to the patient based review of the above and I have provided the pt with a written personalized care plan for preventive services.  I have provided you with a copy of your personalized plan for preventive services. Please take the time to review along with your updated medication list.  Prevnar and flu today No cancer screening due to age No new concerns

## 2014-06-11 NOTE — Assessment & Plan Note (Signed)
Quiet on the dexilant

## 2014-06-11 NOTE — Assessment & Plan Note (Signed)
Still satisfied with primary prevention with the statin

## 2014-06-11 NOTE — Assessment & Plan Note (Signed)
Only takes the theophylline regularly Rarely needs rescue inhaler Doing fine

## 2014-06-11 NOTE — Assessment & Plan Note (Signed)
See social history 

## 2014-06-11 NOTE — Progress Notes (Signed)
Subjective:    Patient ID: Jennifer Roman, female    DOB: 08-25-1928, 80 y.o.   MRN: [id number]  HPI Here for Medicare wellness and follow up Reviewed her form and advanced directives No falls No depression or anhedonia Reviewed her other doctors No tobacco or alcohol No cognitive problems Independent in instrumental ADLs Vision okay. Mild hearing problems--has some "white noise"  Doesn't check BP Will have occasional headaches--relates to past cervical fracture (and neck spasm) No chest pain No palpitations or SOB No syncope No edema  Still on statin No myalgias with this Ongoing intestinal issues due to past surgery--not related to statin Has to be careful with her diet  Asthma has been okay Some flare this fall with the pollen--still doesn't need the symbicort regularly Only used the albuterol 1-2 times in the past 6 months  Happy with detrol No incontinence Only occasional nocturia  dexilant controls her heartburn No dysphagia  Current Outpatient Prescriptions on File Prior to Visit  Medication Sig Dispense Refill  . acetaminophen (TYLENOL) 500 MG tablet Take 500 mg by mouth as needed.       Marland Kitchen albuterol (PROVENTIL,VENTOLIN) 90 MCG/ACT inhaler Inhale 2 puffs into the lungs every 6 (six) hours as needed.  17 g  1  . aspirin EC 81 MG EC tablet Take 81 mg by mouth daily.        Marland Kitchen atorvastatin (LIPITOR) 10 MG tablet TAKE ONE TABLET BY MOUTH EVERY NIGHT AT BEDTIME  90 tablet  3  . calcium citrate-vitamin D (CITRACAL+D) 315-200 MG-UNIT per tablet Take 1 tablet by mouth 2 (two) times daily.        . cetirizine (ZYRTEC) 10 MG tablet Take 10 mg by mouth daily.        Marland Kitchen DEXILANT 60 MG capsule TAKE ONE CAPSULE BY MOUTH DAILY  90 capsule  3  . losartan (COZAAR) 100 MG tablet TAKE ONE (1) TABLET BY MOUTH EVERY DAY  90 tablet  3  . mometasone (NASONEX) 50 MCG/ACT nasal spray Place 2 sprays into the nose as needed.  17 g  3  . Multiple Vitamins-Minerals (MULTIVITAMIN GUMMIES  ADULT) CHEW Chew 1 capsule by mouth daily.      . ondansetron (ZOFRAN-ODT) 4 MG disintegrating tablet Take 1 tablet (4 mg total) by mouth every 12 (twelve) hours as needed.  30 tablet  1  . polyethylene glycol (MIRALAX / GLYCOLAX) packet Take 17 g by mouth as needed.       . sodium chloride (OCEAN) 0.65 % nasal spray 1 spray by Nasal route as needed.        . SYMBICORT 80-4.5 MCG/ACT inhaler INHALE 2 PUFFS INTO THE LUNGS 2 TIMES A DAY AS DIRECTED  10.2 g  11  . theophylline (THEODUR) 200 MG 12 hr tablet TAKE 1 TABLET BY MOUTH DAILY  90 tablet  3  . tolterodine (DETROL LA) 4 MG 24 hr capsule TAKE ONE CAPSULE EACH NIGHT AT BEDTIME  90 capsule  3  . zolpidem (AMBIEN) 5 MG tablet Take 1 tablet (5 mg total) by mouth at bedtime as needed.  30 tablet  0   No current facility-administered medications on file prior to visit.    Allergies  Allergen Reactions  . Promethazine Hcl     Psychotic reaction----tolerates zofran  . Scopolamine     Rash to pills    Past Medical History  Diagnosis Date  . Hypertension   . Asthma   . Vertigo  chronic  . Allergy   . Colonic polyp   . Cancer     lung  . Urinary incontinence   . Hyperlipidemia   . GERD (gastroesophageal reflux disease)   . Osteoporosis     osteoarthritis    Past Surgical History  Procedure Laterality Date  . Esophageal hernia  01/2007    repaired x 4  . Bladder repair    . Eye surgery      cataract extraction, bilateral  . Vaginal hysterectomy    . Appendectomy    . Cholecystectomy    . Joint replacement  02/2010    Left THR  . Joint replacement  5/12    Right THR--Dr Hooten    Family History  Problem Relation Age of Onset  . Stroke Father   . Pulmonary embolism Father     History   Social History  . Marital Status: Widowed    Spouse Name: N/A    Number of Children: 4  . Years of Education: N/A   Occupational History  . Retired, [employer] History Main Topics  . Smoking status:  Never Smoker   . Smokeless tobacco: Never Used  . Alcohol Use: No  . Drug Use: No  . Sexual Activity: Not on file   Other Topics Concern  . Not on file   Social History Narrative   Has living will   Son [name] is health care POA.   Has DNR already.   No feeding tube if cognitively unaware         Review of Systems Still gets regular vertigo--hard for her to travel. Ondansetron does help some for short trips. She usually sleeps okay---will rarely need the zolpidem Appetite is okay Weight is stable Bowels are moving okay-uses miralax or stool softener prn    Objective:   Physical Exam  Constitutional: She is oriented to person, place, and time. She appears well-developed and well-nourished. No distress.  HENT:  Mouth/Throat: Oropharynx is clear and moist. No oropharyngeal exudate.  Neck: Normal range of motion. Neck supple. No thyromegaly present.  Cardiovascular: Normal rate, regular rhythm, normal heart sounds and intact distal pulses.  Exam reveals no gallop.   No murmur heard. Pulmonary/Chest: Effort normal and breath sounds normal. No respiratory distress. She has no wheezes. She has no rales.  Abdominal: Soft. There is no tenderness.  Musculoskeletal: She exhibits no edema and no tenderness.  Lymphadenopathy:    She has no cervical adenopathy.  Neurological: She is alert and oriented to person, place, and time.  President-- "Obama, Bush, Clinton" [id number] D-l-r-o-w Recall 3/3  Skin: No rash noted. No erythema.  Psychiatric: She has a normal mood and affect. Her behavior is normal.          Assessment & Plan:

## 2014-06-12 ENCOUNTER — Encounter: Payer: Self-pay | Admitting: *Deleted

## 2014-06-12 ENCOUNTER — Telehealth: Payer: Self-pay | Admitting: Internal Medicine

## 2014-06-12 LAB — CBC WITH DIFFERENTIAL/PLATELET
BASOS ABS: 0.1 10*3/uL (ref 0.0–0.1)
Basophils Relative: 1.4 % (ref 0.0–3.0)
EOS PCT: 2 % (ref 0.0–5.0)
Eosinophils Absolute: 0.2 10*3/uL (ref 0.0–0.7)
HCT: 41.1 % (ref 36.0–46.0)
HEMOGLOBIN: 13.5 g/dL (ref 12.0–15.0)
LYMPHS PCT: 34.3 % (ref 12.0–46.0)
Lymphs Abs: 2.9 10*3/uL (ref 0.7–4.0)
MCHC: 32.8 g/dL (ref 30.0–36.0)
MCV: 90.6 fl (ref 78.0–100.0)
Monocytes Absolute: 0.6 10*3/uL (ref 0.1–1.0)
Monocytes Relative: 6.6 % (ref 3.0–12.0)
Neutro Abs: 4.7 10*3/uL (ref 1.4–7.7)
Neutrophils Relative %: 55.7 % (ref 43.0–77.0)
Platelets: 300 10*3/uL (ref 150.0–400.0)
RBC: 4.53 Mil/uL (ref 3.87–5.11)
RDW: 13.5 % (ref 11.5–15.5)
WBC: 8.4 10*3/uL (ref 4.0–10.5)

## 2014-06-12 LAB — COMPREHENSIVE METABOLIC PANEL
ALT: 30 U/L (ref 0–35)
AST: 45 U/L — ABNORMAL HIGH (ref 0–37)
Albumin: 4.8 g/dL (ref 3.5–5.2)
Alkaline Phosphatase: 67 U/L (ref 39–117)
BUN: 11 mg/dL (ref 6–23)
CALCIUM: 10 mg/dL (ref 8.4–10.5)
CO2: 29 meq/L (ref 19–32)
CREATININE: 0.8 mg/dL (ref 0.4–1.2)
Chloride: 103 mEq/L (ref 96–112)
GFR: 72.67 mL/min (ref >60.00)
GLUCOSE: 101 mg/dL — AB (ref 70–99)
Potassium: 3.6 mEq/L (ref 3.5–5.1)
Sodium: 140 mEq/L (ref 135–145)
Total Bilirubin: 0.6 mg/dL (ref 0.2–1.2)
Total Protein: 7.9 g/dL (ref 6.0–8.3)

## 2014-06-12 LAB — T4, FREE: FREE T4: 0.89 ng/dL (ref 0.60–1.60)

## 2014-06-12 LAB — LIPID PANEL
CHOLESTEROL: 167 mg/dL (ref 0–200)
HDL: 73 mg/dL (ref >39.00)
LDL Cholesterol: 61 mg/dL (ref 0–99)
NonHDL: 94
Total CHOL/HDL Ratio: 2
Triglycerides: 163 mg/dL — ABNORMAL HIGH (ref 0.0–149.0)
VLDL: 32.6 mg/dL (ref 0.0–40.0)

## 2014-06-12 LAB — THEOPHYLLINE LEVEL: Theophylline Lvl: 11.6 ug/mL (ref 10.0–20.0)

## 2014-06-12 NOTE — Telephone Encounter (Signed)
emmi mailed  °

## 2014-06-17 ENCOUNTER — Other Ambulatory Visit: Payer: Self-pay | Admitting: Internal Medicine

## 2014-06-17 NOTE — Telephone Encounter (Signed)
Electronic Rx request for ambein and zofran received. Patient last office visit was 06/11/14 and ambein fill on 03/03/13, zofran filled 11/29/12 both are ordered PRN. Please advise.

## 2014-06-18 NOTE — Telephone Encounter (Signed)
Okay #30 x 0 for each

## 2014-06-19 ENCOUNTER — Other Ambulatory Visit: Payer: Self-pay | Admitting: Internal Medicine

## 2014-06-19 NOTE — Telephone Encounter (Signed)
Ambein called in to pharmacy. Printed copy in error it was placed in shed box.

## 2014-07-18 ENCOUNTER — Other Ambulatory Visit: Payer: Self-pay | Admitting: Internal Medicine

## 2014-09-29 ENCOUNTER — Telehealth: Payer: Self-pay

## 2014-09-29 NOTE — Telephone Encounter (Signed)
Pt request pharmacy changed in [facility] for future reference. Advised pt done.

## 2014-10-10 ENCOUNTER — Other Ambulatory Visit: Payer: Self-pay | Admitting: Internal Medicine

## 2015-03-04 ENCOUNTER — Other Ambulatory Visit: Payer: Self-pay | Admitting: Internal Medicine

## 2015-03-22 ENCOUNTER — Emergency Department
Admission: EM | Admit: 2015-03-22 | Discharge: 2015-03-22 | Disposition: A | Discharge status: Home / Self Care | Payer: Medicare Other | Attending: Emergency Medicine | Admitting: Emergency Medicine

## 2015-03-22 ENCOUNTER — Encounter: Payer: Self-pay | Admitting: General Practice

## 2015-03-22 ENCOUNTER — Emergency Department: Payer: Medicare Other

## 2015-03-22 ENCOUNTER — Telehealth: Payer: Self-pay | Admitting: Internal Medicine

## 2015-03-22 DIAGNOSIS — R1011 Right upper quadrant pain: Secondary | ICD-10-CM

## 2015-03-22 DIAGNOSIS — Z7951 Long term (current) use of inhaled steroids: Secondary | ICD-10-CM | POA: Insufficient documentation

## 2015-03-22 DIAGNOSIS — I10 Essential (primary) hypertension: Secondary | ICD-10-CM | POA: Insufficient documentation

## 2015-03-22 DIAGNOSIS — N838 Other noninflammatory disorders of ovary, fallopian tube and broad ligament: Secondary | ICD-10-CM

## 2015-03-22 DIAGNOSIS — R0789 Other chest pain: Secondary | ICD-10-CM | POA: Diagnosis present

## 2015-03-22 DIAGNOSIS — Z79899 Other long term (current) drug therapy: Secondary | ICD-10-CM | POA: Diagnosis not present

## 2015-03-22 DIAGNOSIS — Z7982 Long term (current) use of aspirin: Secondary | ICD-10-CM | POA: Diagnosis not present

## 2015-03-22 LAB — LIPASE, BLOOD: Lipase: 50 U/L (ref 22–51)

## 2015-03-22 LAB — CBC
HCT: 42.3 % (ref 35.0–47.0)
Hemoglobin: 13.8 g/dL (ref 12.0–16.0)
MCH: 29 pg (ref 26.0–34.0)
MCHC: 32.6 g/dL (ref 32.0–36.0)
MCV: 88.9 fL (ref 80.0–100.0)
Platelets: 294 10*3/uL (ref 150–440)
RBC: 4.75 MIL/uL (ref 3.80–5.20)
RDW: 14.1 % (ref 11.5–14.5)
WBC: 11.4 10*3/uL — ABNORMAL HIGH (ref 3.6–11.0)

## 2015-03-22 LAB — BASIC METABOLIC PANEL
Anion gap: 11 (ref 5–15)
BUN: 14 mg/dL (ref 6–20)
CO2: 26 mmol/L (ref 22–32)
Calcium: 10.1 mg/dL (ref 8.9–10.3)
Chloride: 99 mmol/L — ABNORMAL LOW (ref 101–111)
Creatinine, Ser: 0.81 mg/dL (ref 0.44–1.00)
GFR calc Af Amer: >60 mL/min (ref >60)
GFR calc non Af Amer: >60 mL/min (ref >60)
Glucose, Bld: 170 mg/dL — ABNORMAL HIGH (ref 65–99)
POTASSIUM: 3.7 mmol/L (ref 3.5–5.1)
SODIUM: 136 mmol/L (ref 135–145)

## 2015-03-22 LAB — TROPONIN I: Troponin I: <0.03 ng/mL (ref <0.031)

## 2015-03-22 LAB — HEPATIC FUNCTION PANEL
ALT: 42 U/L (ref 14–54)
AST: 46 U/L — ABNORMAL HIGH (ref 15–41)
Albumin: 5 g/dL (ref 3.5–5.0)
Alkaline Phosphatase: 82 U/L (ref 38–126)
Bilirubin, Direct: 0.1 mg/dL (ref 0.1–0.5)
Indirect Bilirubin: 0.7 mg/dL (ref 0.3–0.9)
Total Bilirubin: 0.8 mg/dL (ref 0.3–1.2)
Total Protein: 8.1 g/dL (ref 6.5–8.1)

## 2015-03-22 MED ORDER — GI COCKTAIL ~~LOC~~
30.0000 mL | Freq: Once | ORAL | Status: AC
  Administered 2015-03-22: 30 mL via ORAL

## 2015-03-22 MED ORDER — DICYCLOMINE HCL 10 MG/ML IM SOLN
20.0000 mg | Freq: Once | INTRAMUSCULAR | Status: AC
  Administered 2015-03-22: 20 mg via INTRAMUSCULAR

## 2015-03-22 MED ORDER — GI COCKTAIL ~~LOC~~
ORAL | Status: AC
  Administered 2015-03-22: 30 mL via ORAL
  Filled 2015-03-22: qty 30

## 2015-03-22 MED ORDER — MORPHINE SULFATE 2 MG/ML IJ SOLN
INTRAMUSCULAR | Status: AC
  Administered 2015-03-22: 2 mg via INTRAVENOUS
  Filled 2015-03-22: qty 1

## 2015-03-22 MED ORDER — IOHEXOL 240 MG/ML SOLN: 25.0000 mL | Freq: Once | INTRAMUSCULAR | Status: DC | PRN

## 2015-03-22 MED ORDER — ONDANSETRON HCL 4 MG/2ML IJ SOLN
4.0000 mg | Freq: Once | INTRAMUSCULAR | Status: AC
  Administered 2015-03-22: 4 mg via INTRAVENOUS

## 2015-03-22 MED ORDER — IOHEXOL 300 MG/ML  SOLN
100.0000 mL | Freq: Once | INTRAMUSCULAR | Status: AC | PRN
  Administered 2015-03-22: 100 mL via INTRAVENOUS

## 2015-03-22 MED ORDER — DICYCLOMINE HCL 20 MG PO TABS: 20.0000 mg | ORAL_TABLET | Freq: Three times a day (TID) | ORAL | Status: DC | PRN

## 2015-03-22 MED ORDER — ONDANSETRON HCL 4 MG/2ML IJ SOLN
INTRAMUSCULAR | Status: AC
  Administered 2015-03-22: 4 mg via INTRAVENOUS
  Filled 2015-03-22: qty 2

## 2015-03-22 MED ORDER — MORPHINE SULFATE 2 MG/ML IJ SOLN
2.0000 mg | Freq: Once | INTRAMUSCULAR | Status: AC
  Administered 2015-03-22: 2 mg via INTRAVENOUS

## 2015-03-22 NOTE — ED Notes (Signed)
Pt completed contrast CT notified.

## 2015-03-22 NOTE — Telephone Encounter (Signed)
Got ER records Right ovarian mass found----needs further eval Will set up with gyn (rather than starting with ultrasound)

## 2015-03-22 NOTE — Discharge Instructions (Signed)
As discussed it is important that you follow up with your primary care doctor about the mass on your ovary seen on the CT scan today. This will need to be followed up with an ultrasound. Please seek medical attention for any high fevers, chest pain, shortness of breath, change in behavior, persistent vomiting, bloody stool or any other new or concerning symptoms.  Abdominal Pain Many things can cause abdominal pain. Usually, abdominal pain is not caused by a disease and will improve without treatment. It can often be observed and treated at home. Your health care provider will do a physical exam and possibly order blood tests and X-rays to help determine the seriousness of your pain. However, in many cases, more time must pass before a clear cause of the pain can be found. Before that point, your health care provider may not know if you need more testing or further treatment. HOME CARE INSTRUCTIONS  Monitor your abdominal pain for any changes. The following actions may help to alleviate any discomfort you are experiencing:  Only take over-the-counter or prescription medicines as directed by your health care provider.  Do not take laxatives unless directed to do so by your health care provider.  Try a clear liquid diet (broth, tea, or water) as directed by your health care provider. Slowly move to a bland diet as tolerated. SEEK MEDICAL CARE IF:  You have unexplained abdominal pain.  You have abdominal pain associated with nausea or diarrhea.  You have pain when you urinate or have a bowel movement.  You experience abdominal pain that wakes you in the night.  You have abdominal pain that is worsened or improved by eating food.  You have abdominal pain that is worsened with eating fatty foods.  You have a fever. SEEK IMMEDIATE MEDICAL CARE IF:   Your pain does not go away within 2 hours.  You keep throwing up (vomiting).  Your pain is felt only in portions of the abdomen, such as the  right side or the left lower portion of the abdomen.  You pass bloody or black tarry stools. MAKE SURE YOU:  Understand these instructions.   Will watch your condition.   Will get help right away if you are not doing well or get worse.  Document Released: 06/07/2005 Document Revised: 09/02/2013 Document Reviewed: 05/07/2013 [facility] Patient Information 2015 [place], [place]. This information is not intended to replace advice given to you by your health care provider. Make sure you discuss any questions you have with your health care provider.

## 2015-03-22 NOTE — ED Provider Notes (Signed)
[facility] Emergency Department Provider Note    ____________________________________________  Time seen: 0715  I have reviewed the triage vital signs and the nursing notes.   HISTORY  Chief Complaint Chest Pain   History limited by: Not Limited   HPI Jennifer Roman is a 81 y.o. female who presents to the emergency department today because of right sided upper abdominal pain. The patient states the pain started last night. It gradually got worse. It has been constant since it started. She describes it as a feeling of fullness. The pain does radiate up her right back. She has had some nausea. No vomiting. No significant change in bowel movements. No dysuria. No fevers.    Past Medical History  Diagnosis Date  . Hypertension   . Asthma   . Vertigo     chronic  . Allergy   . Colonic polyp   . Cancer     lung  . Urinary incontinence   . Hyperlipidemia   . GERD (gastroesophageal reflux disease)   . Osteoporosis     osteoarthritis    Patient Active Problem List   Diagnosis Date Noted  . Advanced directives, counseling/discussion 06/11/2014  . Routine general medical examination at a health care facility 06/09/2013  . Sleep disturbance 06/30/2010  . OSTEOARTHRITIS 03/10/2010  . Hyperlipidemia 02/18/2010  . Essential hypertension, benign 02/18/2010  . ALLERGIC RHINITIS 02/18/2010  . Mild intermittent asthma in adult without complication 02/77/4128  . GERD 02/18/2010  . OSTEOPOROSIS 02/18/2010  . Urge urinary incontinence 02/18/2010  . LUNG CANCER, HX OF 02/18/2010  . COLONIC POLYPS, HX OF 02/18/2010    Past Surgical History  Procedure Laterality Date  . Esophageal hernia  01/2007    repaired x 4  . Bladder repair    . Eye surgery      cataract extraction, bilateral  . Vaginal hysterectomy    . Appendectomy    . Cholecystectomy    . Joint replacement  02/2010    Left THR  . Joint replacement  5/12    Right THR--Dr Hooten     Current Outpatient Rx  Name  Route  Sig  Dispense  Refill  . acetaminophen (TYLENOL) 500 MG tablet   Oral   Take 500 mg by mouth as needed.          Marland Kitchen albuterol (PROVENTIL,VENTOLIN) 90 MCG/ACT inhaler   Inhalation   Inhale 2 puffs into the lungs every 6 (six) hours as needed.   17 g   1   . aspirin EC 81 MG EC tablet   Oral   Take 81 mg by mouth daily.           Marland Kitchen atorvastatin (LIPITOR) 10 MG tablet      TAKE ONE TABLET BY MOUTH EVERY NIGHT AT BEDTIME   90 tablet   3   . calcium citrate-vitamin D (CITRACAL+D) 315-200 MG-UNIT per tablet   Oral   Take 1 tablet by mouth 2 (two) times daily.           . cetirizine (ZYRTEC) 10 MG tablet   Oral   Take 10 mg by mouth daily.           Marland Kitchen DEXILANT 60 MG capsule      TAKE 1 CAPSULE EVERY DAY   60 capsule   2     FOR FUTURE REFILLS   . losartan (COZAAR) 100 MG tablet      TAKE ONE TABLET EVERY DAY  30 tablet   5     FOR FUTURE REFILLS   . mometasone (NASONEX) 50 MCG/ACT nasal spray   Nasal   Place 2 sprays into the nose as needed.   17 g   3   . Multiple Vitamins-Minerals (MULTIVITAMIN GUMMIES ADULT) CHEW   Oral   Chew 1 capsule by mouth daily.         . ondansetron (ZOFRAN-ODT) 4 MG disintegrating tablet      DISSOLVE 1 TABLET IN MOUTH EVERY 12 HOURS AS NEEDED FOR NAUSEA OR VOMITING   30 tablet   0   . polyethylene glycol (MIRALAX / GLYCOLAX) packet   Oral   Take 17 g by mouth as needed.          . sodium chloride (OCEAN) 0.65 % nasal spray   Nasal   1 spray by Nasal route as needed.           . SYMBICORT 80-4.5 MCG/ACT inhaler      INHALE 2 PUFFS INTO THE LUNGS 2 TIMES A DAY AS DIRECTED   10.2 g   11   . theophylline (UNIPHYL) 400 MG 24 hr tablet      ONE-HALF TABLET BY MOUTH EVERY DAY   45 tablet   0   . tolterodine (DETROL LA) 4 MG 24 hr capsule      TAKE ONE CAPSULE BY MOUTH EVERY NIGHT AT BEDTIME   90 capsule   3   . zolpidem (AMBIEN) 5 MG tablet      TAKE 1  TABLET BY MOUTH EVERY NIGHT AT BEDTIME AS NEEDED.   30 tablet   0     Allergies Promethazine hcl and Scopolamine  Family History  Problem Relation Age of Onset  . Stroke Father   . Pulmonary embolism Father     Social History History  Substance Use Topics  . Smoking status: Never Smoker   . Smokeless tobacco: Never Used  . Alcohol Use: No    Review of Systems  Constitutional: Negative for fever. Cardiovascular: Negative for chest pain. Respiratory: Negative for shortness of breath. Gastrointestinal: Right upper quadrant abdominal pain. Nausea. Genitourinary: Negative for dysuria. Musculoskeletal: Negative for back pain. Skin: Negative for rash. Neurological: Negative for headaches, focal weakness or numbness.  10-point ROS otherwise negative.  ____________________________________________   PHYSICAL EXAM:  VITAL SIGNS: ED Triage Vitals  Enc Vitals Group     BP 03/22/15 0642 176/77 mmHg     Pulse Rate 03/22/15 0642 64     Resp 03/22/15 0642 18     Temp 03/22/15 0642 97.8 F (36.6 C)     Temp Source 03/22/15 0642 Oral     SpO2 03/22/15 0642 100 %     Weight 03/22/15 0642 130 lb (58.968 kg)     Height 03/22/15 0642 5' (1.524 m)     Head Cir --      Peak Flow --      Pain Score 03/22/15 0643 8   Constitutional: Alert and oriented. Well appearing and in no distress. Eyes: Conjunctivae are normal. PERRL. Normal extraocular movements. ENT   Head: Normocephalic and atraumatic.   Nose: No congestion/rhinnorhea.   Mouth/Throat: Mucous membranes are moist.   Neck: No stridor. Hematological/Lymphatic/Immunilogical: No cervical lymphadenopathy. Cardiovascular: Normal rate, regular rhythm.  No murmurs, rubs, or gallops. Respiratory: Normal respiratory effort without tachypnea nor retractions. Breath sounds are clear and equal bilaterally. No wheezes/rales/rhonchi. Gastrointestinal: Soft and nontender. No distention. There is no CVA  tenderness.  Genitourinary: Deferred Musculoskeletal: Normal range of motion in all extremities. No joint effusions.  No lower extremity tenderness nor edema. Neurologic:  Normal speech and language. No gross focal neurologic deficits are appreciated. Speech is normal.  Skin:  Skin is warm, dry and intact. No rash noted. Psychiatric: Mood and affect are normal. Speech and behavior are normal. Patient exhibits appropriate insight and judgment.  ____________________________________________    LABS (pertinent positives/negatives)  Labs Reviewed  BASIC METABOLIC PANEL - Abnormal; Notable for the following:    Chloride 99 (*)    Glucose, Bld 170 (*)    All other components within normal limits  CBC - Abnormal; Notable for the following:    WBC 11.4 (*)    All other components within normal limits  HEPATIC FUNCTION PANEL - Abnormal; Notable for the following:    AST 46 (*)    All other components within normal limits  TROPONIN I  LIPASE, BLOOD     ____________________________________________   EKG  I, Nance Pear, attending physician, personally viewed and interpreted this EKG  EKG Time: 0644 Rate: 62 Rhythm: NSR Axis: normal Intervals: qtc 462 QRS: narrow ST changes: no st elevation ____________________________________________    RADIOLOGY  CXR IMPRESSION: No acute chest abnormality.  CT abd/pel IMPRESSION: 1. New 3.7 cm complex right ovarian mass worrisome for neoplasm. Pelvic ultrasound recommended for further characterization. 2. Minimal diverticulosis of the distal colon. 3. Otherwise benign appearing abdomen and pelvis.  ____________________________________________   PROCEDURES  Procedure(s) performed: None  Critical Care performed: No  ____________________________________________   INITIAL IMPRESSION / ASSESSMENT AND PLAN / ED COURSE  Pertinent labs & imaging results that were available during my care of the patient were reviewed by me and  considered in my medical decision making (see chart for details).  Patient presents with right upper quadrant abdominal pain. On exam no significant tenderness to the abdomen. Lungs are clear.  ----------------------------------------- 11:08 AM on 03/22/2015 -----------------------------------------  Troponin negative 2. CT scan without acute findings in the abdomen and pelvis. I did discuss the finding of the pelvic mass with the patient and importance of following up with primary care doctor for further imaging. Additionally patient did feel better after Bentyl, will give prescription. Discussed return precautions. ____________________________________________   FINAL CLINICAL IMPRESSION(S) / ED DIAGNOSES  Final diagnoses:  Right upper quadrant pain     Nance Pear, MD 03/22/15 1108

## 2015-03-22 NOTE — ED Notes (Signed)
MD at bedside. 

## 2015-03-22 NOTE — ED Notes (Signed)
Patient transported to CT 

## 2015-03-22 NOTE — ED Notes (Signed)
Pt to ED from [facility] via EMS c/o persistent R sided CP since last night. Pt is A&O, speaking clearly, denies ShOB, N/V/D.

## 2015-03-23 NOTE — Telephone Encounter (Signed)
Spoke to pt about gyn referral. I am working on getting her in at [facility]. Pt wanted me to apologize for her b/c when you called her the other day, she had so much pain medication in her she does not remember everything she said to you. She states she is much better now.

## 2015-03-23 NOTE — Telephone Encounter (Signed)
Okay I woke her from nap also so that may have contributed also

## 2015-03-24 ENCOUNTER — Telehealth: Payer: Self-pay

## 2015-03-24 NOTE — Telephone Encounter (Signed)
Spoke with patient and she states the right upper quadrant pain in not coming from the mass. Pt states she has 2 issues going on and one has nothing to do with the other, pt is concerned about controlling the pain, per pt [facility] gave her medication but it's not appropriate and pt would like appt with Dr. Silvio Pate ASAP to discuss pain management. Pt states Caryl Pina from [facility] was supposed to call Dr. Silvio Pate to discuss this problem and keep Dr. Silvio Pate up to date. I advised pt that we do have the ED notes and pt states those notes don't address the pain problem. Pt states she's only going to the OB/GYN to get a ultrasound. Please advise  OG/GYN appt is today at 3:00pm

## 2015-03-24 NOTE — Telephone Encounter (Signed)
Okay to add her on tomorrow I can start at 1:45 if necessary

## 2015-03-24 NOTE — Telephone Encounter (Signed)
I made a referral to gyn What has happened about that?

## 2015-03-24 NOTE — Telephone Encounter (Signed)
Pt left v/m was seen [facility] ED on 03/22/15 with pain in upper abd and rt side of back. Pt said she was not given reason for pain and to f/u with PCP. Looked on [facility] ED note and ED doctor noted found pelvic mass and to f/u with PCP for further imaging. Does pt need appt to be seen/ 15 or 30 min.Please advise. Pt request cb [phone].

## 2015-03-24 NOTE — Telephone Encounter (Signed)
I spoke to patient and she scheduled appointment on 03/25/15 at 4:15.

## 2015-03-25 ENCOUNTER — Ambulatory Visit (INDEPENDENT_AMBULATORY_CARE_PROVIDER_SITE_OTHER): Payer: Medicare Other | Admitting: Internal Medicine

## 2015-03-25 ENCOUNTER — Encounter: Payer: Self-pay | Admitting: Internal Medicine

## 2015-03-25 VITALS — BP 124/74 | HR 72 | Temp 98.4°F | Wt 129.0 lb

## 2015-03-25 DIAGNOSIS — N839 Noninflammatory disorder of ovary, fallopian tube and broad ligament, unspecified: Secondary | ICD-10-CM

## 2015-03-25 DIAGNOSIS — R109 Unspecified abdominal pain: Secondary | ICD-10-CM | POA: Insufficient documentation

## 2015-03-25 DIAGNOSIS — N838 Other noninflammatory disorders of ovary, fallopian tube and broad ligament: Secondary | ICD-10-CM

## 2015-03-25 DIAGNOSIS — K219 Gastro-esophageal reflux disease without esophagitis: Secondary | ICD-10-CM | POA: Diagnosis not present

## 2015-03-25 MED ORDER — GABAPENTIN 100 MG PO CAPS: 100.0000 mg | ORAL_CAPSULE | Freq: Every day | ORAL | Status: DC

## 2015-03-25 NOTE — Assessment & Plan Note (Signed)
If cancer markers are up---will send to oncology She isn't sure if she wants surgery--and doesn't want to go to [facility] or [facility]

## 2015-03-25 NOTE — Assessment & Plan Note (Signed)
Sounds radicular Has separate epigastric pain that maalox helps Will try gabapentin at bedtime

## 2015-03-25 NOTE — Assessment & Plan Note (Signed)
Continue the dexilant

## 2015-03-25 NOTE — Progress Notes (Signed)
Subjective:    Patient ID: Jennifer Roman, female    DOB: 02-03-1928, 81 y.o.   MRN: [id number]  HPI Here due to abdominal pain DIL Gregary Signs is here  EMTs came to her 4 days ago Had pain in right shoulder, under right breast and around back They did EKG--was okay. Didn't take her The pain got much worse over a few hours--- "bent over" To [facility] ER then--reviewed those records and I spoke to her later that day (she was confused from the meds for pain she got) Med in ER not helpful--- given bentyl as Rx but that didn't help either  Went to [facility] gyn yesterday 2 hour wait-- saw Dr Leonides Schanz Didn't think ultrasound would help--would need laparoscopic biopsy then more definitive procedure if frozen section shows cancer Did get CA-125 and 19-9  Still having the pain in right stomach,flank and RUQ though She feels it is better with maalox and dexilant--but flank portion persists Awakens in middle of night--dull, achy pain Using tylenol for this  Current Outpatient Prescriptions on File Prior to Visit  Medication Sig Dispense Refill  . acetaminophen (TYLENOL) 500 MG tablet Take 500 mg by mouth as needed.     Marland Kitchen albuterol (PROVENTIL,VENTOLIN) 90 MCG/ACT inhaler Inhale 2 puffs into the lungs every 6 (six) hours as needed. 17 g 1  . aspirin EC 81 MG EC tablet Take 81 mg by mouth daily.      Marland Kitchen atorvastatin (LIPITOR) 10 MG tablet TAKE ONE TABLET BY MOUTH EVERY NIGHT AT BEDTIME 90 tablet 3  . calcium citrate-vitamin D (CITRACAL+D) 315-200 MG-UNIT per tablet Take 1 tablet by mouth 2 (two) times daily.      . cetirizine (ZYRTEC) 10 MG tablet Take 10 mg by mouth daily.      Marland Kitchen DEXILANT 60 MG capsule TAKE 1 CAPSULE EVERY DAY 60 capsule 2  . losartan (COZAAR) 100 MG tablet TAKE ONE TABLET EVERY DAY 30 tablet 5  . mometasone (NASONEX) 50 MCG/ACT nasal spray Place 2 sprays into the nose as needed. 17 g 3  . Multiple Vitamins-Minerals (MULTIVITAMIN GUMMIES ADULT) CHEW Chew 1 capsule by mouth daily.    .  ondansetron (ZOFRAN-ODT) 4 MG disintegrating tablet DISSOLVE 1 TABLET IN MOUTH EVERY 12 HOURS AS NEEDED FOR NAUSEA OR VOMITING 30 tablet 0  . polyethylene glycol (MIRALAX / GLYCOLAX) packet Take 17 g by mouth as needed.     . sodium chloride (OCEAN) 0.65 % nasal spray 1 spray by Nasal route as needed.      . SYMBICORT 80-4.5 MCG/ACT inhaler INHALE 2 PUFFS INTO THE LUNGS 2 TIMES A DAY AS DIRECTED 10.2 g 11  . theophylline (UNIPHYL) 400 MG 24 hr tablet ONE-HALF TABLET BY MOUTH EVERY DAY 45 tablet 0  . tolterodine (DETROL LA) 4 MG 24 hr capsule TAKE ONE CAPSULE BY MOUTH EVERY NIGHT AT BEDTIME 90 capsule 3  . zolpidem (AMBIEN) 5 MG tablet TAKE 1 TABLET BY MOUTH EVERY NIGHT AT BEDTIME AS NEEDED. 30 tablet 0   No current facility-administered medications on file prior to visit.    Allergies  Allergen Reactions  . Promethazine Hcl     Psychotic reaction----tolerates zofran  . Scopolamine     Rash to pills    Past Medical History  Diagnosis Date  . Hypertension   . Asthma   . Vertigo     chronic  . Allergy   . Colonic polyp   . Cancer     lung  . Urinary  incontinence   . Hyperlipidemia   . GERD (gastroesophageal reflux disease)   . Osteoporosis     osteoarthritis    Past Surgical History  Procedure Laterality Date  . Esophageal hernia  01/2007    repaired x 4  . Bladder repair    . Eye surgery      cataract extraction, bilateral  . Vaginal hysterectomy    . Appendectomy    . Cholecystectomy    . Joint replacement  02/2010    Left THR  . Joint replacement  5/12    Right THR--Dr Hooten    Family History  Problem Relation Age of Onset  . Stroke Father   . Pulmonary embolism Father     History   Social History  . Marital Status: Widowed    Spouse Name: N/A  . Number of Children: 4  . Years of Education: N/A   Occupational History  . Retired, [employer] History Main Topics  . Smoking status: Never Smoker   . Smokeless tobacco: Never  Used  . Alcohol Use: No  . Drug Use: No  . Sexual Activity: Not on file   Other Topics Concern  . Not on file   Social History Narrative   Has living will   Son [name] is health care POA.   Has DNR already.   No feeding tube if cognitively unaware         Review of Systems No heartburn or swallowing problems No fever No cough or SOB    Objective:   Physical Exam  Constitutional: She appears well-developed and well-nourished. No distress.  Neck: Normal range of motion. Neck supple. No thyromegaly present.  Cardiovascular: Normal rate, regular rhythm and normal heart sounds.  Exam reveals no gallop.   No murmur heard. Pulmonary/Chest: Effort normal and breath sounds normal. No respiratory distress. She has no wheezes. She has no rales. She exhibits no tenderness.  Abdominal: Soft. She exhibits no distension. There is no tenderness. There is no rebound and no guarding.  Lymphadenopathy:    She has no cervical adenopathy.  Skin:  No rash on flank or trunk          Assessment & Plan:

## 2015-03-25 NOTE — Progress Notes (Signed)
Pre visit review using our clinic review tool, if applicable. No additional management support is needed unless otherwise documented below in the visit note. 

## 2015-03-26 ENCOUNTER — Telehealth: Payer: Self-pay

## 2015-03-26 NOTE — Telephone Encounter (Signed)
Pt called back.  i read her exactly what dr Silvio Pate wrote. She said thank you for the great care

## 2015-03-26 NOTE — Telephone Encounter (Signed)
I left a message for the patient to return my call.

## 2015-03-26 NOTE — Telephone Encounter (Signed)
Pt left v/m;pt seen 03/25/15; pt received lab results that CA 125 and CA 19-8 were normal. Pt took Neurontin 100 mg last night and woke up at 5 AM after a good nights sleep. Pt wants to know if has pain in AM can pt take neurontin as an as needed medication. Pt request cb.

## 2015-03-26 NOTE — Telephone Encounter (Signed)
Let her know I am excited about the great news! It is okay to use gabapentin in the daytime also---if the pain breaks through. It can cause sedation though. I would be okay with her using 100mg  up to 3 daily for now---either at bedtime or split during the day. She shouldn't drive after taking a daytime dose till she knows how it affects her

## 2015-03-29 ENCOUNTER — Other Ambulatory Visit: Payer: Self-pay | Admitting: *Deleted

## 2015-03-29 MED ORDER — ONDANSETRON 4 MG PO TBDP: ORAL_TABLET | ORAL | Status: DC

## 2015-03-29 NOTE — Telephone Encounter (Signed)
rx last written 06/2014, last ov was a f/u on 03/25/15. Ok to refill?

## 2015-03-29 NOTE — Telephone Encounter (Signed)
Approved: okay #[id number] x 0 

## 2015-04-01 ENCOUNTER — Other Ambulatory Visit: Payer: Self-pay | Admitting: Internal Medicine

## 2015-04-14 ENCOUNTER — Telehealth: Payer: Self-pay | Admitting: Internal Medicine

## 2015-04-14 NOTE — Telephone Encounter (Signed)
PLEASE NOTE: All timestamps contained within this report are represented as [place] Standard Time. CONFIDENTIALTY NOTICE: This fax transmission is intended only for the addressee. It contains information that is legally privileged, confidential or otherwise protected from use or disclosure. If you are not the intended recipient, you are strictly prohibited from reviewing, disclosing, copying using or disseminating any of this information or taking any action in reliance on or regarding this information. If you have received this fax in error, please notify us immediately by telephone so that we can arrange for its return to [place]. Phone: [phone], Toll-Free: [phone], Fax: [phone] Page: 1 of 2 Call Id: [id number] [place] Patient Name: Jennifer Roman Gender: Female DOB: 07-10-1928 Age: 81 Y 66 M 18 D Return Phone Number: [phone] (Primary) Address: City/State/Zip: [place] Client [facility] - Client Client Site [facility] - Day Physician Viviana Simpler Contact Type Call Call Type Triage / Clinical Relationship To Patient Self Appointment Disposition EMR Appointment Not Necessary Info pasted into Epic Yes Return Phone Number [phone] (Primary) Chief Complaint Flank Pain Initial Comment Caller states pain; upper left side ; unrelated to what she has been seen about before PreDisposition Home Care Nurse Assessment Nurse: Malva Cogan, RN, Juliann Pulse Date/Time Eilene Ghazi Time): 04/14/2015 3:56:40 PM Confirm and document reason for call. If symptomatic, describe symptoms. ---Caller states that she had onset of (L) shoulder pain on Monday. No known injury. Has the patient traveled out of the country within the last 30 days? ---No Does the patient require triage? ---Yes Related visit to physician within the last 2 weeks? ---Yes was seen by  chiropractor today for shoulder pain Does the PT have any chronic conditions? (i.e. diabetes, asthma, etc.) ---Yes List chronic conditions. ---nerve pain possibly R/T shingles, HTN, acid reflux, H/O lung cancer Guidelines Guideline Title Affirmed Question Affirmed Notes Nurse Date/Time Eilene Ghazi Time) Shoulder Pain Shoulder pain (all triage questions negative) Malva Cogan, RN, Juliann Pulse 04/14/2015 3:59:55 PM Disp. Time Eilene Ghazi Time) Disposition Final User 04/14/2015 [place], RN, Gara Kroner Understands: Yes Disagree/Comply: Comply PLEASE NOTE: All timestamps contained within this report are represented as [place] Standard Time. CONFIDENTIALTY NOTICE: This fax transmission is intended only for the addressee. It contains information that is legally privileged, confidential or otherwise protected from use or disclosure. If you are not the intended recipient, you are strictly prohibited from reviewing, disclosing, copying using or disseminating any of this information or taking any action in reliance on or regarding this information. If you have received this fax in error, please notify us immediately by telephone so that we can arrange for its return to [place]. Phone: [phone], Toll-Free: [phone], Fax: [phone] Page: 2 of 2 Call Id: [id number] Care Advice Given Per Guideline HOME CARE: You should be able to treat this at home. PAIN MEDICINES: * For pain relief, take acetaminophen, ibuprofen, or naproxen. * Use the lowest amount that makes your pain feel better. ACETAMINOPHEN (E.G., TYLENOL): * Take 650 mg (two 325 mg pills) by mouth every 4-6 hours as needed. Each Regular Strength Tylenol pill has 325 mg of acetaminophen. The most you should take each day is 3,250 mg (10 Regular Strength pills a day). * Another choice is to take 1,000 mg (two 500 mg pills) every 8 hours as needed. Each Extra Strength Tylenol pill has 500 mg of acetaminophen. The most you should take  each day is 3,000  mg (6 Extra Strength pills a day). EXTRA NOTES: * Acetaminophen is thought to be safer than ibuprofen or naproxen for people over 50 years old. Acetaminophen is in many OTC and prescription medicines. It might be in more than one medicine that you are taking. You need to be careful and not take an overdose. An acetaminophen overdose can hurt the liver. * Before taking any medicine, read all the instructions on the package. CALL BACK IF: * Chest pain or difficulty breathing occurs * Signs of infection occur (e.g., spreading redness, warmth, fever) * You become worse CARE ADVICE given per Shoulder Pain (Adult) guideline After Care Instructions Given Call Event Type User Date / Time Description Comments User: Olena Mater, RN Date/Time (Eastern Time): 04/14/2015 4:09:45 PM Very difficult to control call as caller speaks very quickly & gets off the subject frequently.

## 2015-04-14 NOTE — Telephone Encounter (Signed)
PLEASE NOTE: All timestamps contained within this report are represented as [place] Standard Time. CONFIDENTIALTY NOTICE: This fax transmission is intended only for the addressee. It contains information that is legally privileged, confidential or otherwise protected from use or disclosure. If you are not the intended recipient, you are strictly prohibited from reviewing, disclosing, copying using or disseminating any of this information or taking any action in reliance on or regarding this information. If you have received this fax in error, please notify us immediately by telephone so that we can arrange for its return to [place]. Phone: [phone], Toll-Free: [phone], Fax: [phone] Page: 1 of 1 Call Id: [id number] [place] Patient Name: Jennifer Roman DOB: 05-23-1928 Initial Comment Caller states pain; upper left side ; unrelated to what she has been seen about before Nurse Assessment Nurse: Malva Cogan, RN, Juliann Pulse Date/Time Eilene Ghazi Time): 04/14/2015 3:56:40 PM Confirm and document reason for call. If symptomatic, describe symptoms. ---Caller states that she had onset of (L) shoulder pain on Monday. No known injury. Has the patient traveled out of the country within the last 30 days? ---No Does the patient require triage? ---Yes Related visit to physician within the last 2 weeks? ---Yes was seen by chiropractor today for shoulder pain Does the PT have any chronic conditions? (i.e. diabetes, asthma, etc.) ---Yes List chronic conditions. ---nerve pain possibly R/T shingles, HTN, acid reflux, H/O lung cancer Guidelines Guideline Title Affirmed Question Affirmed Notes Shoulder Pain Shoulder pain (all triage questions negative) Final Disposition User [place], RN, Juliann Pulse Comments Very difficult to control call as caller speaks very quickly & gets off the subject  frequently. Disagree/Comply: Comply

## 2015-04-15 NOTE — Telephone Encounter (Signed)
Pt left v/m; pt was seen about 1 month ago with pain in rt side; now pt has pain upper left quadrant at 1st rib on the back; pain is radiating into the neck and down the arm. Pt describes pain as severe; pain started on 04/12/15 and worsened on 04/14/15. Pt spoke with TH and was advised to take Tylenol. Tylenol did not help pain and pt went back on prescribed dosage of gabapentin. Gabapentin has helped the pain and pt wants to know if should continue the gabapentin or should pt be seen. Pt request cb [phone].

## 2015-04-15 NOTE — Telephone Encounter (Signed)
Please check on her today

## 2015-04-15 NOTE — Telephone Encounter (Signed)
Have her restart the gabapentin and set up appt in 2-3 weeks to see how she is doing and decide if needs increase, etc

## 2015-04-16 NOTE — Telephone Encounter (Signed)
.  left message to have patient return my call.  

## 2015-04-16 NOTE — Telephone Encounter (Signed)
I would recommend that she try increasing the night dose to 400mg , then 500mg , then 600mg  at bedtime. She should make the increase every 2 days until the pain is better. If not better by her appt, we will review other options She does need appt in 2-3 weeks

## 2015-04-16 NOTE — Telephone Encounter (Addendum)
Pt had left v/m requesting cb but Karena Addison CMA has already spoken with pt.

## 2015-04-16 NOTE — Telephone Encounter (Signed)
Spoke with patient and advised results, pt has a lot of questions about this rx and what times of the day and night pt should take the medication, pt wanted specific times of the day, night and morning that she should take this. I tried my best to explain but somehow I don't think she fully understood. Pt wanted to know if she could add in tylenol as needed? Please advise

## 2015-04-16 NOTE — Telephone Encounter (Signed)
She probably needs to be seen in the office to go over this

## 2015-04-16 NOTE — Telephone Encounter (Signed)
Spoke with patient ( which was hard to get a word in) she already restarted the gabapentin, taking 3 at night and states it's not working. Per pt she also tried 2 at night and 1 during the day and that still didn't help. Pt states this is a new site of the pain and need to know the dose she needs to take for this new pain on the opposite side.

## 2015-04-19 NOTE — Telephone Encounter (Signed)
.  left message to have patient return my call.  

## 2015-04-21 NOTE — Telephone Encounter (Signed)
.  left message to have patient return my call.  

## 2015-04-27 ENCOUNTER — Other Ambulatory Visit: Payer: Self-pay | Admitting: Internal Medicine

## 2015-04-29 ENCOUNTER — Other Ambulatory Visit: Payer: Self-pay | Admitting: Internal Medicine

## 2015-05-03 ENCOUNTER — Ambulatory Visit (INDEPENDENT_AMBULATORY_CARE_PROVIDER_SITE_OTHER): Payer: Medicare Other | Admitting: Internal Medicine

## 2015-05-03 ENCOUNTER — Encounter: Payer: Self-pay | Admitting: Internal Medicine

## 2015-05-03 VITALS — BP 128/70 | HR 80 | Temp 98.1°F | Wt 125.0 lb

## 2015-05-03 DIAGNOSIS — Z23 Encounter for immunization: Secondary | ICD-10-CM | POA: Diagnosis not present

## 2015-05-03 DIAGNOSIS — R109 Unspecified abdominal pain: Secondary | ICD-10-CM

## 2015-05-03 MED ORDER — GABAPENTIN 100 MG PO CAPS: 200.0000 mg | ORAL_CAPSULE | Freq: Three times a day (TID) | ORAL | Status: DC

## 2015-05-03 NOTE — Assessment & Plan Note (Signed)
I think this is radicular Not intraabdominal Also has ?cervical radiculopathy Has had some relief from gabapentin but not sure how to take it (not sedating) Will change it to 100 tid. Will go up as needed Change the baclofen to prn only

## 2015-05-03 NOTE — Patient Instructions (Signed)
Please increase the gabapentin to 2 capsules (200mg ) three times a day. Please call me if you think this is not working well enough--we can increase it more. Only take the baclofen as needed if you are having severe spasm. Try to limit the use of the hydrocodone

## 2015-05-03 NOTE — Addendum Note (Signed)
Addended by: Despina Hidden on: 05/03/2015 02:29 PM   Modules accepted: Orders

## 2015-05-03 NOTE — Progress Notes (Signed)
Subjective:    Patient ID: Jennifer Roman, female    DOB: [birth year], 81 y.o.   MRN: [id number]  HPI Here due to ongoing pain  Wanted to clarify gabapentin orders--- went off, then back on Helped at night but then ongoing pain in day Taking 2 in AM --today at least.  Given baclofen at walk in clinic at [facility] X-ray of shoulder--"just arthritis" Had prednisone taper also Got hydrocodone for prn Went to [facility] after this---diagnosed bursitis. Gave injection--not clearly helpful  Reviewed the severe pain in July "all I could do was cry" Diagnosed with shingles (she thought)--I told her nerve (radiculopathy)  Current Outpatient Prescriptions on File Prior to Visit  Medication Sig Dispense Refill  . acetaminophen (TYLENOL) 500 MG tablet Take 500 mg by mouth as needed.     Marland Kitchen albuterol (PROVENTIL,VENTOLIN) 90 MCG/ACT inhaler Inhale 2 puffs into the lungs every 6 (six) hours as needed. 17 g 1  . DEXILANT 60 MG capsule TAKE 1 CAPSULE EVERY DAY 60 capsule 2  . gabapentin (NEURONTIN) 100 MG capsule TAKE 1 TO 3 CAPSULES (100 TO 300MG ) BY MOUTH AT BEDTIME 90 capsule 0  . losartan (COZAAR) 100 MG tablet TAKE ONE TABLET BY MOUTH EVERY DAY 30 tablet 11  . mometasone (NASONEX) 50 MCG/ACT nasal spray Place 2 sprays into the nose as needed. 17 g 3  . ondansetron (ZOFRAN-ODT) 4 MG disintegrating tablet DISSOLVE 1 TABLET IN MOUTH EVERY 12 HOURS AS NEEDED FOR NAUSEA OR VOMITING 30 tablet 0  . polyethylene glycol (MIRALAX / GLYCOLAX) packet Take 17 g by mouth as needed.     . sodium chloride (OCEAN) 0.65 % nasal spray 1 spray by Nasal route as needed.      . SYMBICORT 80-4.5 MCG/ACT inhaler INHALE 2 PUFFS INTO THE LUNGS 2 TIMES A DAY AS DIRECTED 10.2 g 11  . theophylline (UNIPHYL) 400 MG 24 hr tablet TAKE ONE-HALF TABLET BY MOUTH EVERY DAY 45 tablet 0   No current facility-administered medications on file prior to visit.    Allergies  Allergen Reactions  . Promethazine Hcl     Psychotic  reaction----tolerates zofran  . Scopolamine     Rash to pills    Past Medical History  Diagnosis Date  . Hypertension   . Asthma   . Vertigo     chronic  . Allergy   . Colonic polyp   . Cancer     lung  . Urinary incontinence   . Hyperlipidemia   . GERD (gastroesophageal reflux disease)   . Osteoporosis     osteoarthritis    Past Surgical History  Procedure Laterality Date  . Esophageal hernia  01/2007    repaired x 4  . Bladder repair    . Eye surgery      cataract extraction, bilateral  . Vaginal hysterectomy    . Appendectomy    . Cholecystectomy    . Joint replacement  02/2010    Left THR  . Joint replacement  5/12    Right THR--Dr Hooten    Family History  Problem Relation Age of Onset  . Stroke Father   . Pulmonary embolism Father     Social History   Social History  . Marital Status: Widowed    Spouse Name: N/A  . Number of Children: 4  . Years of Education: N/A   Occupational History  . Retired, [place] History Main Topics  . Smoking status: Never Smoker   .  Smokeless tobacco: Never Used  . Alcohol Use: No  . Drug Use: No  . Sexual Activity: Not on file   Other Topics Concern  . Not on file   Social History Narrative   Has living will   Son [name] is health care POA.   Has DNR already.   No feeding tube if cognitively unaware        'Review of Systems Substernal pain better with dexilant and maalox Is sleeping okay Appetite is fine--but weight down slightly    Objective:   Physical Exam  Neck:  Decreased rotation and tilt of neck  Musculoskeletal:  Left shoulder passive ROM is normal          Assessment & Plan:

## 2015-05-03 NOTE — Progress Notes (Signed)
Pre visit review using our clinic review tool, if applicable. No additional management support is needed unless otherwise documented below in the visit note. 

## 2015-06-18 ENCOUNTER — Encounter: Payer: Medicare Other | Admitting: Internal Medicine

## 2015-09-02 ENCOUNTER — Encounter: Payer: Self-pay | Admitting: Gynecologic Oncology

## 2015-09-02 ENCOUNTER — Other Ambulatory Visit: Payer: Medicare Other

## 2015-09-02 ENCOUNTER — Ambulatory Visit: Payer: Medicare Other | Attending: Gynecologic Oncology | Admitting: Gynecologic Oncology

## 2015-09-02 DIAGNOSIS — R1909 Other intra-abdominal and pelvic swelling, mass and lump: Secondary | ICD-10-CM | POA: Insufficient documentation

## 2015-09-02 DIAGNOSIS — N838 Other noninflammatory disorders of ovary, fallopian tube and broad ligament: Secondary | ICD-10-CM

## 2015-09-02 DIAGNOSIS — N839 Noninflammatory disorder of ovary, fallopian tube and broad ligament, unspecified: Secondary | ICD-10-CM | POA: Insufficient documentation

## 2015-09-02 NOTE — Progress Notes (Signed)
Consult Note: Gyn-Onc  Consult was requested by Dr. Corinna Capra for the evaluation of Jennifer Roman 81 y.o. female  CC:  Chief Complaint  Patient presents with  . Ovarian Cancer    New consultation    Assessment/Plan:  Jennifer Roman  is a 81 y.o.  year old with a solid right ovarian mass (5cm).   I reviewed the report from her ultrasound from 08/16/2015 and the images from her CT scan on 03/22/2015. I acknowledged an increased size in the mass over that time they feel that its characteristics are overall not consistent with a high-grade ovarian neoplasm.  Most of poorly the patient does not desire surgery, is at high surgical risk for complications given her age and extensive prior surgical history, and strongly does not want adjuvant therapy such as chemotherapy if she doesn't fact have an ovarian cancer. Given all of these feelings by the patient and her desire to live out her the remainder of her life in good quality of life, I see no role for surgical workup of potential ovarian cancer. It is reasonable to recheck her CA-125 again today. It was in the upper range of normal in July 2016. If it is markedly elevated at this time it would not change my decision to not operate but instead would 8 guide my counseling of the patient that she likely has ovarian cancer and we should make plans for palliative care and hospice at her request.  I discussed that if she was interested in surgical intervention we could attempt this minimally invasively. I discussed that given her multiple prior abdominal surgery she was at high risk for visceral injury or complication or potentially conversion to laparotomy all of which would cause a prolonged recovery and increased morbidity. Once again given that the patient feels strongly that she would not receive adjuvant chemotherapy if her cancer was diagnosed and removed at surgery, and the unhelpful impact of surgery alone in the treatment of ovarian cancer without  chemotherapy, I see no indication for surgery in this patient.  I will see her back in 6 months for repeated imaging of the mass. She understands that if it becomes symptomatic we would reconsider surgery for palliative reasons. I do not believe her right ovarian mass is the cause of her epigastric pain.  HPI: Jennifer Roman is an 81 year old para swollen woman (G4 P4) who seen in consultation at the request of Dr. Corinna Capra for right-sided ovarian mass. The patient had an incidentally found ovarian mass on CT scan performed on 03/22/2015 as part of workup for epigastric pain. At this time the mass that was described was a complex 3.7 x 3.1 x 2.9 cm mass arising from the right ovary. On that CT scan there were no signs concerning for metastatic ovarian cancer. A CA-125 was drawn at that time and was mildly elevated at 33 units per milliliter (upper limit of normal 38 units per milliliter). Of interest her CA-19-9 was mildly elevated at 42.  She was told that she had ovarian cancer however the patient did not want intervention at that time and therefore declined further follow-up, workup, treatment.  In the intervening time. She has had progressive epigastric discomfort. It is a little unclear if Y that she found her way to Dr. Gregor Hams office for further evaluation of this right adnexal mass in December 2016. At that time a transvaginal ultrasound was performed. It reveals surgically absent uterus. No left adnexal masses were seen. The right adnexa measured  5.2 x 4.1 x 5.3 cm with a complex mass appearing mostly solid with some cystic areas, and a small amount of blood flow seen within the mass does not sleep free fluid seen. CA-125 was not redraw on.  The patient is asymptomatic from this mass that may occasionally have slight menstrual cramp-like sensations.  Of note she has a very significant surgical history. Her surgeries include a vaginal hysterectomy for benign indications. An open appendectomy, and open  cholecystectomy, multiple suspensions of her bladder but unlikely were done vaginally. She is also had a diaphragmatic hernia repair approximately 10-15 years ago that was performed laparoscopically at [facility] and was complicated requiring at least 2 reoperations laparoscopically to correct the issue. Since that time she's had persistent epigastric pain and difficulty digesting food. She is persistent discomfort from this.  She is otherwise fairly healthy with some asthma, and hypertension. She has vertigo. She has a remote history of stage I lung cancer treated with surgery.  She lives alone in assisted living facility. She has no help at home. She is reactive with a good quality of life and strongly desires to maintain this. The patient has an active living will which states 3 things specifically #1 she does not desire any surgery, #2 she does not desire any major intervention such as chemotherapy or radiation, #3 she desires quality of life considerations over prolongation of life.   Current Meds:  Outpatient Encounter Prescriptions as of 09/02/2015  Medication Sig  . acetaminophen (TYLENOL) 500 MG tablet Take 500 mg by mouth as needed.   Marland Kitchen albuterol (PROVENTIL,VENTOLIN) 90 MCG/ACT inhaler Inhale 2 puffs into the lungs every 6 (six) hours as needed.  Marland Kitchen aspirin EC 81 MG tablet Take 81 mg by mouth daily.  . calcium citrate-vitamin D (CITRACAL+D) 315-200 MG-UNIT tablet Take 1 tablet by mouth 2 (two) times daily.  . cetirizine (ZYRTEC) 10 MG tablet Take 10 mg by mouth daily as needed.   Marland Kitchen DEXILANT 60 MG capsule TAKE 1 CAPSULE EVERY DAY  . losartan (COZAAR) 100 MG tablet TAKE ONE TABLET BY MOUTH EVERY DAY  . mometasone (NASONEX) 50 MCG/ACT nasal spray Place 2 sprays into the nose as needed.  . ondansetron (ZOFRAN-ODT) 4 MG disintegrating tablet DISSOLVE 1 TABLET IN MOUTH EVERY 12 HOURS AS NEEDED FOR NAUSEA OR VOMITING  . ranitidine (ZANTAC) 75 MG tablet Take 75 mg by mouth as needed for heartburn.  .  sodium chloride (OCEAN) 0.65 % nasal spray 1 spray by Nasal route as needed.    . SYMBICORT 80-4.5 MCG/ACT inhaler INHALE 2 PUFFS INTO THE LUNGS 2 TIMES A DAY AS DIRECTED  . theophylline (UNIPHYL) 400 MG 24 hr tablet TAKE ONE-HALF TABLET BY MOUTH EVERY DAY  . tolterodine (DETROL LA) 4 MG 24 hr capsule Take 4 mg by mouth daily.  . Wheat Dextrin (BENEFIBER DRINK MIX PO) Take 1 packet by mouth.  . zolpidem (AMBIEN) 5 MG tablet Take 5 mg by mouth at bedtime as needed for sleep.  . baclofen (LIORESAL) 10 MG tablet Take 10 mg by mouth 3 (three) times daily as needed. Reported on 09/02/2015  . gabapentin (NEURONTIN) 100 MG capsule Take 2 capsules (200 mg total) by mouth 3 (three) times daily. (Patient not taking: Reported on 09/02/2015)  . HYDROcodone-acetaminophen (NORCO/VICODIN) 5-325 MG per tablet Take by mouth every 6 (six) hours as needed. Reported on 09/02/2015  . [DISCONTINUED] polyethylene glycol (MIRALAX / GLYCOLAX) packet Take 17 g by mouth as needed.    No facility-administered  encounter medications on file as of 09/02/2015.    Allergy:  Allergies  Allergen Reactions  . Promethazine Hcl     Psychotic reaction----tolerates zofran  . Scopolamine     Rash to pills    Social Hx:   Social History   Social History  . Marital Status: Widowed    Spouse Name: N/A  . Number of Children: 4  . Years of Education: N/A   Occupational History  . Retired, [place] History Main Topics  . Smoking status: Never Smoker   . Smokeless tobacco: Never Used  . Alcohol Use: No  . Drug Use: No  . Sexual Activity: Not on file   Other Topics Concern  . Not on file   Social History Narrative   Has living will   Son Jennifer Roman is health care POA.   Has DNR already.   No feeding tube if cognitively unaware          Past Surgical Hx:  Past Surgical History  Procedure Laterality Date  . Esophageal hernia  01/2007    repaired x 4  . Bladder repair    . Eye surgery       cataract extraction, bilateral  . Vaginal hysterectomy    . Appendectomy    . Cholecystectomy    . Joint replacement  02/2010    Left THR  . Joint replacement  5/12    Right THR--Dr Hooten    Past Medical Hx:  Past Medical History  Diagnosis Date  . Hypertension   . Asthma   . Vertigo     chronic  . Allergy   . Colonic polyp   . Cancer ([place])     lung  . Urinary incontinence   . Hyperlipidemia   . GERD (gastroesophageal reflux disease)   . Osteoporosis     osteoarthritis    Past Gynecological History:  SVD x 4  No LMP recorded. Patient has had a hysterectomy.  Family Hx:  Family History  Problem Relation Age of Onset  . Stroke Father   . Pulmonary embolism Father     Review of Systems:  Constitutional  Feels well,    ENT Normal appearing ears and nares bilaterally Skin/Breast  No rash, sores, jaundice, itching, dryness Cardiovascular  No chest pain, shortness of breath, or edema  Pulmonary  No cough or wheeze.  Gastro Intestinal  No nausea, vomitting, or diarrhoea. No bright red blood per rectum,+ abdominal pain, change in bowel movement, or constipation.  Genito Urinary  No frequency, urgency, dysuria, see HPI Musculo Skeletal  No myalgia, arthralgia, joint swelling or pain  Neurologic  No weakness, numbness, change in gait,  Psychology  No depression, anxiety, insomnia.   Vitals:  Blood pressure 173/69, pulse 69, temperature 98 F (36.7 C), temperature source Oral, height 5' (1.524 m), weight 121 lb 9 oz (55.14 kg).  Physical Exam: WD in NAD Neck  Supple NROM, without any enlargements.  Lymph Node Survey No cervical supraclavicular or inguinal adenopathy Cardiovascular  Pulse normal rate, regularity and rhythm. S1 and S2 normal.  Lungs  Clear to auscultation bilateraly, without wheezes/crackles/rhonchi. Good air movement.  Skin  No rash/lesions/breakdown  Psychiatry  Alert and oriented to person, place, and time  Abdomen  Normoactive  bowel sounds, abdomen soft, non-tender and Thin, multiple incision sites, without evidence of hernia.  Back No CVA tenderness Genito Urinary  Vulva/vagina: Normal external female genitalia.   No lesions. No discharge or bleeding.  Bladder/urethra:  No lesions or masses, well supported bladder  Vagina: normal, narrow introitus  Cervix: Normal appearing, no lesions.  Uterus:  Small, mobile, no parametrial involvement or nodularity.  Adnexa: Firm mobile fullness appreciated in right adnexa. Rectal  Good tone, no masses no cul de sac nodularity.  Extremities  No bilateral cyanosis, clubbing or edema.   Donaciano Eva, MD  09/02/2015, 5:04 PM

## 2015-09-02 NOTE — Patient Instructions (Signed)
We will contact you with the results of your CA 125 from today.  Plan for a follow up appt in three months with an ultrasound before.  Please call for any questions or concerns.

## 2015-09-03 ENCOUNTER — Telehealth: Payer: Self-pay

## 2015-09-03 LAB — CA 125: CA 125: 28 U/mL (ref <35)

## 2015-09-03 NOTE — Telephone Encounter (Signed)
Orders received from [facility] , APNP to contcat the patient to update with Ca 125 level of  ( 28 ) was ( 33.4 ) last level  . Patient contacted with results , patient states understanding , denies further questions or concerns at this time , did go over her future appointments in 2017 .

## 2015-09-09 ENCOUNTER — Encounter: Payer: Self-pay | Admitting: *Deleted

## 2015-11-29 ENCOUNTER — Ambulatory Visit (HOSPITAL_COMMUNITY)
Admission: RE | Admit: 2015-11-29 | Discharge: 2015-11-29 | Disposition: A | Discharge status: Home / Self Care | Payer: Medicare Other | Source: Ambulatory Visit | Attending: Gynecologic Oncology | Admitting: Gynecologic Oncology

## 2015-11-29 DIAGNOSIS — N839 Noninflammatory disorder of ovary, fallopian tube and broad ligament, unspecified: Secondary | ICD-10-CM | POA: Insufficient documentation

## 2015-11-29 DIAGNOSIS — N838 Other noninflammatory disorders of ovary, fallopian tube and broad ligament: Secondary | ICD-10-CM

## 2015-11-29 DIAGNOSIS — Z90710 Acquired absence of both cervix and uterus: Secondary | ICD-10-CM | POA: Insufficient documentation

## 2015-12-01 ENCOUNTER — Telehealth: Payer: Self-pay

## 2015-12-01 NOTE — Telephone Encounter (Signed)
Orders received from [facility], APNP to contact the patient with U/S Transvaginal Non-OB : No changes . Patient contacted and updated with results , patient states understanding ,denies further questions. Patient is aware she has a follow up appointment with Dr Everitt Amber  tomorrow 12/02/2015 at 2:30 PM labs CA 125 after MD follow up , per patient's request.

## 2015-12-02 ENCOUNTER — Ambulatory Visit: Payer: Medicare Other | Attending: Gynecologic Oncology | Admitting: Gynecologic Oncology

## 2015-12-02 ENCOUNTER — Encounter: Payer: Self-pay | Admitting: Gynecologic Oncology

## 2015-12-02 ENCOUNTER — Other Ambulatory Visit: Payer: Self-pay | Admitting: Gynecologic Oncology

## 2015-12-02 ENCOUNTER — Other Ambulatory Visit: Payer: Medicare Other

## 2015-12-02 VITALS — BP 176/70 | HR 70 | Temp 97.8°F | Resp 16 | Wt 120.2 lb

## 2015-12-02 DIAGNOSIS — M810 Age-related osteoporosis without current pathological fracture: Secondary | ICD-10-CM | POA: Diagnosis not present

## 2015-12-02 DIAGNOSIS — I10 Essential (primary) hypertension: Secondary | ICD-10-CM | POA: Insufficient documentation

## 2015-12-02 DIAGNOSIS — Z85118 Personal history of other malignant neoplasm of bronchus and lung: Secondary | ICD-10-CM | POA: Insufficient documentation

## 2015-12-02 DIAGNOSIS — N838 Other noninflammatory disorders of ovary, fallopian tube and broad ligament: Secondary | ICD-10-CM

## 2015-12-02 DIAGNOSIS — N839 Noninflammatory disorder of ovary, fallopian tube and broad ligament, unspecified: Secondary | ICD-10-CM

## 2015-12-02 DIAGNOSIS — E785 Hyperlipidemia, unspecified: Secondary | ICD-10-CM | POA: Insufficient documentation

## 2015-12-02 DIAGNOSIS — R1909 Other intra-abdominal and pelvic swelling, mass and lump: Secondary | ICD-10-CM

## 2015-12-02 DIAGNOSIS — J45909 Unspecified asthma, uncomplicated: Secondary | ICD-10-CM | POA: Insufficient documentation

## 2015-12-02 DIAGNOSIS — K219 Gastro-esophageal reflux disease without esophagitis: Secondary | ICD-10-CM | POA: Insufficient documentation

## 2015-12-02 DIAGNOSIS — R32 Unspecified urinary incontinence: Secondary | ICD-10-CM | POA: Diagnosis not present

## 2015-12-02 NOTE — Patient Instructions (Signed)
Plan to follow up with your regular physicians.  No follow up for the ovarian cyst necessary at this time.  Please call for any questions or concerns.

## 2015-12-02 NOTE — Progress Notes (Signed)
Consult Note: Gyn-Onc  Consult was requested by Dr. Corinna Capra for the evaluation of Jennifer Roman 82 y.o. female  CC:  Chief Complaint  Patient presents with  . ovarian mass right    MD follow up visit    Assessment/Plan:  Jennifer Roman  is a 83 y.o.  year old with a solid right ovarian mass (2.7 cm).   I reviewed the report and images from her ultrasound from 11/29/15. Her right ovarian cyst is stable/slightly decreased in size. Her tumor marker is stable and normal. Given this presentation and the natural history that has now been followed for approximately 9 months, I favor benign pathology.  Most of poorly the patient does not desire surgery, is at high surgical risk for complications given her age and extensive prior surgical history, and strongly does not want adjuvant therapy such as chemotherapy if she doesn't fact have an ovarian cancer. Given all of these feelings by the patient and the likely benign nature of this cyst, I see no role for surgical workup of potential ovarian cancer.   The patient does not require ongoing follow-up of this (repeated imaging or CA 125).   I do not believe her right ovarian mass is the cause of her epigastric pain.  She will follow-up with me on a prn basis.  HPI: Jennifer Roman is an 82 year old para swollen woman (G4 P4) who seen in consultation at the request of Dr. Corinna Capra for right-sided ovarian mass. The patient had an incidentally found ovarian mass on CT scan performed on 03/22/2015 as part of workup for epigastric pain. At this time the mass that was described was a complex 3.7 x 3.1 x 2.9 cm mass arising from the right ovary. On that CT scan there were no signs concerning for metastatic ovarian cancer. A CA-125 was drawn at that time and was mildly elevated at 33 units per milliliter (upper limit of normal 38 units per milliliter). Of interest her CA-19-9 was mildly elevated at 42.  She was told that she had ovarian cancer however the patient  did not want intervention at that time and therefore declined further follow-up, workup, treatment.  In the intervening time. She has had progressive epigastric discomfort. It is a little unclear if Y that she found her way to Dr. Gregor Hams office for further evaluation of this right adnexal mass in December 2016. At that time a transvaginal ultrasound was performed. It reveals surgically absent uterus. No left adnexal masses were seen. The right adnexa measured 5.2 x 4.1 x 5.3 cm with a complex mass appearing mostly solid with some cystic areas, and a small amount of blood flow seen within the mass does not sleep free fluid seen. CA-125 was not redraw on.  The patient is asymptomatic from this mass that may occasionally have slight menstrual cramp-like sensations.  Of note she has a very significant surgical history. Her surgeries include a vaginal hysterectomy for benign indications. An open appendectomy, and open cholecystectomy, multiple suspensions of her bladder but unlikely were done vaginally. She is also had a diaphragmatic hernia repair approximately 10-15 years ago that was performed laparoscopically at [facility] and was complicated requiring at least 2 reoperations laparoscopically to correct the issue. Since that time she's had persistent epigastric pain and difficulty digesting food. She is persistent discomfort from this.  She is otherwise fairly healthy with some asthma, and hypertension. She has vertigo. She has a remote history of stage I lung cancer treated with surgery.  She  lives alone in assisted living facility. She has no help at home. She is reactive with a good quality of life and strongly desires to maintain this. The patient has an active living will which states 3 things specifically #1 she does not desire any surgery, #2 she does not desire any major intervention such as chemotherapy or radiation, #3 she desires quality of life considerations over prolongation of life.  She decided  on expectant management rather than surgical intervention.  Interval Hx:  On 09/01/16 CA 125 was drawn and was 22. On 11/28/13 she underwent transvaginal US to re-evaluate the cyst. "It showed a stable cyst: 2.3 x 2.1 x 2.1 cm complex cystic mass with thick septations and nodularity worrisome for right ovarian neoplasm. Left ovary not visualized." Her CA 125 from today is not yet resulted.   Current Meds:  Outpatient Encounter Prescriptions as of 12/02/2015  Medication Sig  . acetaminophen (TYLENOL) 500 MG tablet Take 500 mg by mouth as needed.   Marland Kitchen albuterol (PROVENTIL,VENTOLIN) 90 MCG/ACT inhaler Inhale 2 puffs into the lungs every 6 (six) hours as needed.  Marland Kitchen aspirin EC 81 MG tablet Take 81 mg by mouth daily.  . baclofen (LIORESAL) 10 MG tablet Take 10 mg by mouth 3 (three) times daily as needed. Reported on 09/02/2015  . calcium citrate-vitamin D (CITRACAL+D) 315-200 MG-UNIT tablet Take 1 tablet by mouth 2 (two) times daily.  . cetirizine (ZYRTEC) 10 MG tablet Take 10 mg by mouth daily as needed.   Marland Kitchen DEXILANT 60 MG capsule TAKE 1 CAPSULE EVERY DAY  . gabapentin (NEURONTIN) 100 MG capsule Take 2 capsules (200 mg total) by mouth 3 (three) times daily. (Patient not taking: Reported on 09/02/2015)  . HYDROcodone-acetaminophen (NORCO/VICODIN) 5-325 MG per tablet Take by mouth every 6 (six) hours as needed. Reported on 09/02/2015  . losartan (COZAAR) 100 MG tablet TAKE ONE TABLET BY MOUTH EVERY DAY  . mometasone (NASONEX) 50 MCG/ACT nasal spray Place 2 sprays into the nose as needed.  . ondansetron (ZOFRAN-ODT) 4 MG disintegrating tablet DISSOLVE 1 TABLET IN MOUTH EVERY 12 HOURS AS NEEDED FOR NAUSEA OR VOMITING  . ranitidine (ZANTAC) 75 MG tablet Take 75 mg by mouth as needed for heartburn.  . sodium chloride (OCEAN) 0.65 % nasal spray 1 spray by Nasal route as needed.    . SYMBICORT 80-4.5 MCG/ACT inhaler INHALE 2 PUFFS INTO THE LUNGS 2 TIMES A DAY AS DIRECTED  . theophylline (UNIPHYL) 400 MG 24  hr tablet TAKE ONE-HALF TABLET BY MOUTH EVERY DAY  . tolterodine (DETROL LA) 4 MG 24 hr capsule Take 4 mg by mouth daily.  . Wheat Dextrin (BENEFIBER DRINK MIX PO) Take 1 packet by mouth.  . zolpidem (AMBIEN) 5 MG tablet Take 5 mg by mouth at bedtime as needed for sleep.   No facility-administered encounter medications on file as of 12/02/2015.    Allergy:  Allergies  Allergen Reactions  . Promethazine Hcl     Psychotic reaction----tolerates zofran  . Scopolamine     Rash to pills    Social Hx:   Social History   Social History  . Marital Status: Widowed    Spouse Name: N/A  . Number of Children: 4  . Years of Education: N/A   Occupational History  . Retired, [place] History Main Topics  . Smoking status: Never Smoker   . Smokeless tobacco: Never Used  . Alcohol Use: No  . Drug Use: No  .  Sexual Activity: Not on file   Other Topics Concern  . Not on file   Social History Narrative   Has living will   Son [name] is health care POA.   Has DNR already.   No feeding tube if cognitively unaware          Past Surgical Hx:  Past Surgical History  Procedure Laterality Date  . Esophageal hernia  01/2007    repaired x 4  . Bladder repair    . Eye surgery      cataract extraction, bilateral  . Vaginal hysterectomy    . Appendectomy    . Cholecystectomy    . Joint replacement  02/2010    Left THR  . Joint replacement  5/12    Right THR--Dr Hooten    Past Medical Hx:  Past Medical History  Diagnosis Date  . Hypertension   . Asthma   . Vertigo     chronic  . Allergy   . Colonic polyp   . Cancer ([place])     lung  . Urinary incontinence   . Hyperlipidemia   . GERD (gastroesophageal reflux disease)   . Osteoporosis     osteoarthritis    Past Gynecological History:  SVD x 4  No LMP recorded. Patient has had a hysterectomy.  Family Hx:  Family History  Problem Relation Age of Onset  . Stroke Father   . Pulmonary embolism  Father     Review of Systems:  Constitutional  Feels well,    ENT Normal appearing ears and nares bilaterally Skin/Breast  No rash, sores, jaundice, itching, dryness Cardiovascular  No chest pain, shortness of breath, or edema  Pulmonary  No cough or wheeze.  Gastro Intestinal  No nausea, vomitting, or diarrhoea. No bright red blood per rectum,+ abdominal pain, change in bowel movement, or constipation.  Genito Urinary  No frequency, urgency, dysuria, see HPI Musculo Skeletal  No myalgia, arthralgia, joint swelling or pain  Neurologic  No weakness, numbness, change in gait,  Psychology  No depression, anxiety, insomnia.   Vitals:  Blood pressure 176/70, pulse 70, temperature 97.8 F (36.6 C), temperature source Oral, resp. rate 16, weight 120 lb 4 oz (54.545 kg).  Physical Exam: WD in NAD Resp: in no pulm distress Extremities: no edema.   Donaciano Eva, MD  12/02/2015, 2:48 PM

## 2015-12-03 ENCOUNTER — Telehealth: Payer: Self-pay

## 2015-12-03 LAB — CANCER ANTIGEN 125 (PARALLEL TESTING): CA 125: 23 U/mL (ref <35)

## 2015-12-03 LAB — CA 125: Cancer Antigen (CA) 125: 22.6 U/mL (ref 0.0–38.1)

## 2015-12-03 NOTE — Telephone Encounter (Signed)
Orders received from [facility] , APNP to contact the patient with her CA 125 level 22.6 "normal". Contacted the patient and updated with CA 125 level , patient states understanding , denies further questions at this time.

## 2016-06-12 ENCOUNTER — Other Ambulatory Visit: Payer: Self-pay | Admitting: Ophthalmology

## 2016-06-12 DIAGNOSIS — H5711 Ocular pain, right eye: Secondary | ICD-10-CM

## 2016-06-12 DIAGNOSIS — H02831 Dermatochalasis of right upper eyelid: Secondary | ICD-10-CM

## 2016-06-19 ENCOUNTER — Ambulatory Visit
Admission: RE | Admit: 2016-06-19 | Discharge: 2016-06-19 | Disposition: A | Discharge status: Home / Self Care | Payer: Medicare Other | Source: Ambulatory Visit | Attending: Ophthalmology | Admitting: Ophthalmology

## 2016-06-19 DIAGNOSIS — H5711 Ocular pain, right eye: Secondary | ICD-10-CM | POA: Diagnosis not present

## 2016-06-19 DIAGNOSIS — H02831 Dermatochalasis of right upper eyelid: Secondary | ICD-10-CM

## 2016-06-19 LAB — POCT I-STAT CREATININE: Creatinine, Ser: 0.9 mg/dL (ref 0.44–1.00)

## 2016-06-19 MED ORDER — IOPAMIDOL (ISOVUE-300) INJECTION 61%
75.0000 mL | Freq: Once | INTRAVENOUS | Status: AC | PRN
  Administered 2016-06-19: 75 mL via INTRAVENOUS

## 2016-08-08 ENCOUNTER — Other Ambulatory Visit: Payer: Self-pay | Admitting: Family Medicine

## 2016-08-08 DIAGNOSIS — R930 Abnormal findings on diagnostic imaging of skull and head, not elsewhere classified: Secondary | ICD-10-CM

## 2016-08-29 ENCOUNTER — Ambulatory Visit
Admission: RE | Admit: 2016-08-29 | Discharge: 2016-08-29 | Disposition: A | Discharge status: Home / Self Care | Payer: Medicare Other | Source: Ambulatory Visit | Attending: Family Medicine | Admitting: Family Medicine

## 2016-08-29 DIAGNOSIS — R930 Abnormal findings on diagnostic imaging of skull and head, not elsewhere classified: Secondary | ICD-10-CM | POA: Insufficient documentation

## 2017-02-20 ENCOUNTER — Other Ambulatory Visit: Payer: Self-pay | Admitting: Family Medicine

## 2017-02-20 DIAGNOSIS — I671 Cerebral aneurysm, nonruptured: Secondary | ICD-10-CM

## 2017-03-02 ENCOUNTER — Ambulatory Visit
Admission: RE | Admit: 2017-03-02 | Discharge: 2017-03-02 | Disposition: A | Discharge status: Home / Self Care | Payer: Medicare Other | Source: Ambulatory Visit | Attending: Family Medicine | Admitting: Family Medicine

## 2017-03-02 DIAGNOSIS — I671 Cerebral aneurysm, nonruptured: Secondary | ICD-10-CM | POA: Insufficient documentation

## 2017-05-31 ENCOUNTER — Other Ambulatory Visit: Payer: Self-pay | Admitting: Internal Medicine

## 2017-09-20 ENCOUNTER — Other Ambulatory Visit: Payer: Self-pay | Admitting: Family Medicine

## 2017-09-20 DIAGNOSIS — R937 Abnormal findings on diagnostic imaging of other parts of musculoskeletal system: Secondary | ICD-10-CM

## 2017-09-20 DIAGNOSIS — I671 Cerebral aneurysm, nonruptured: Secondary | ICD-10-CM

## 2017-09-20 DIAGNOSIS — I6522 Occlusion and stenosis of left carotid artery: Secondary | ICD-10-CM

## 2017-09-28 ENCOUNTER — Ambulatory Visit
Admission: RE | Admit: 2017-09-28 | Discharge: 2017-09-28 | Disposition: A | Discharge status: Home / Self Care | Payer: Medicare Other | Source: Ambulatory Visit | Attending: Family Medicine | Admitting: Family Medicine

## 2017-09-28 DIAGNOSIS — I6522 Occlusion and stenosis of left carotid artery: Secondary | ICD-10-CM

## 2017-09-28 DIAGNOSIS — I671 Cerebral aneurysm, nonruptured: Secondary | ICD-10-CM | POA: Diagnosis present

## 2017-09-28 DIAGNOSIS — R937 Abnormal findings on diagnostic imaging of other parts of musculoskeletal system: Secondary | ICD-10-CM

## 2017-10-16 ENCOUNTER — Encounter (INDEPENDENT_AMBULATORY_CARE_PROVIDER_SITE_OTHER): Payer: Self-pay | Admitting: Vascular Surgery

## 2017-10-16 ENCOUNTER — Ambulatory Visit (INDEPENDENT_AMBULATORY_CARE_PROVIDER_SITE_OTHER): Payer: Medicare Other | Admitting: Vascular Surgery

## 2017-10-16 VITALS — BP 174/77 | HR 77 | Ht 59.0 in | Wt 112.4 lb

## 2017-10-16 DIAGNOSIS — I671 Cerebral aneurysm, nonruptured: Secondary | ICD-10-CM

## 2017-10-16 DIAGNOSIS — I10 Essential (primary) hypertension: Secondary | ICD-10-CM | POA: Diagnosis not present

## 2017-10-16 DIAGNOSIS — E785 Hyperlipidemia, unspecified: Secondary | ICD-10-CM | POA: Diagnosis not present

## 2017-10-16 DIAGNOSIS — I6523 Occlusion and stenosis of bilateral carotid arteries: Secondary | ICD-10-CM

## 2017-10-16 DIAGNOSIS — I6529 Occlusion and stenosis of unspecified carotid artery: Secondary | ICD-10-CM | POA: Insufficient documentation

## 2017-10-16 NOTE — Patient Instructions (Signed)
Carotid Artery Disease The carotid arteries are arteries on both sides of the neck. They carry blood to the brain. Carotid artery disease is when the arteries get smaller (narrow) or get blocked. If these arteries get smaller or get blocked, you are more likely to have a stroke or warning stroke (transient ischemic attack). Follow these instructions at home:  Take medicines as told by your doctor. Make sure you understand all your medicine instructions. Do not stop your medicines without talking to your doctor first.  Follow your doctor's diet instructions. It is important to eat a healthy diet that includes plenty of: ? Fresh fruits. ? Vegetables. ? Lean meats.  Avoid: ? High-fat foods. ? High-sodium foods. ? Foods that are fried, overly processed, or have poor nutritional value.  Stay a healthy weight.  Stay active. Get at least 30 minutes of activity every day.  Do not smoke.  Limit alcohol use to: ? No more than 2 drinks a day for men. ? No more than 1 drink a day for women who are not pregnant.  Do not use illegal drugs.  Keep all doctor visits as told. Get help right away if:  You have sudden weakness or loss of feeling (numbness) on one side of the body, such as the face, arm, or leg.  You have sudden confusion.  You have trouble speaking (aphasia) or understanding.  You have sudden trouble seeing out of one or both eyes.  You have sudden trouble walking.  You have dizziness or feel like you might pass out (faint).  You have a loss of balance or your movements are not steady (uncoordinated).  You have a sudden, severe headache with no known cause.  You have trouble swallowing (dysphagia). Call your local emergency services (911 in [place] Do notdrive yourself to the clinic or hospital. This information is not intended to replace advice given to you by your health care provider. Make sure you discuss any questions you have with your health care  provider. Document Released: 08/14/2012 Document Revised: 02/03/2016 Document Reviewed: 02/26/2013 Elsevier Interactive Patient Education  2018 Elsevier Inc.  

## 2017-10-16 NOTE — Assessment & Plan Note (Signed)
lipid control important in reducing the progression of atherosclerotic disease. Continue statin therapy  

## 2017-10-16 NOTE — Assessment & Plan Note (Signed)
Small.  Has been getting this checked every 6 months.  We can plan another MRI in 6 months and eventually will probably transition to an annual follow-up.

## 2017-10-16 NOTE — Assessment & Plan Note (Signed)
A recent carotid artery duplex suggested less than 50% carotid artery stenosis bilaterally.  Continue aspirin and Lipitor.  No role for intervention.  Plan to recheck this in 1 year with duplex.

## 2017-10-16 NOTE — Assessment & Plan Note (Signed)
blood pressure control important in reducing the progression of atherosclerotic disease. On appropriate oral medications.  

## 2017-10-16 NOTE — Progress Notes (Signed)
Patient ID: Jennifer Roman, female   DOB: 1928-04-10, 84 y.o.   MRN: [id number]  Chief Complaint  Patient presents with  . New Patient (Initial Visit)    ref self for CAS from previous screening    HPI Jennifer Roman is a 84 y.o. female.  Patient present for evaluation of carotid disease and a small intracranial aneurysm.  She was seen at a vascular screening about 9 or 10 years ago and found to have no significant peripheral arterial disease or carotid disease she remains remarkably active and vigorous despite her advancing age and has been having a lot more neck pain and headaches.  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  For further evaluation a carotid duplex was performed last month which showed mild carotid artery disease of less than 50% bilaterally.  Given these findings, she would like [place] to follow her vascular disease.  She has not had a stroke or TIA.  She does have vertigo and dizziness.  Current Outpatient Medications  Medication Sig Dispense Refill  . acetaminophen (TYLENOL) 500 MG tablet Take 500 mg by mouth as needed.     Marland Kitchen albuterol (PROVENTIL,VENTOLIN) 90 MCG/ACT inhaler Inhale 2 puffs into the lungs every 6 (six) hours as needed. 17 g 1  . aspirin EC 81 MG tablet Take 81 mg by mouth daily.    Marland Kitchen atorvastatin (LIPITOR) 10 MG tablet Take 10 mg by mouth daily.    Marland Kitchen BIOTIN PO Take by mouth.    . budesonide-formoterol (SYMBICORT) 80-4.5 MCG/ACT inhaler Inhale 2 puffs into the lungs as needed.     . calcium citrate-vitamin D (CITRACAL+D) 315-200 MG-UNIT tablet Take 1 tablet by mouth 2 (two) times daily.    . cetirizine (ZYRTEC) 10 MG tablet Take 10 mg by mouth daily as needed.     Marland Kitchen losartan (COZAAR) 100 MG tablet TAKE ONE TABLET BY MOUTH EVERY DAY 30 tablet 11  . Melatonin 3 MG TABS Take by mouth daily.     . mometasone (NASONEX) 50 MCG/ACT nasal spray Place 2 sprays into the nose as  needed. 17 g 3  . ondansetron (ZOFRAN-ODT) 4 MG disintegrating tablet DISSOLVE 1 TABLET IN MOUTH EVERY 12 HOURS AS NEEDED FOR NAUSEA OR VOMITING 30 tablet 0  . Pneumococcal 13-Val Conj Vacc (PREVNAR 13 IM) Inject into the muscle once.    . ranitidine (ZANTAC) 75 MG tablet Take 75 mg by mouth as needed for heartburn.    . SYMBICORT 80-4.5 MCG/ACT inhaler INHALE 2 PUFFS INTO THE LUNGS 2 TIMES A DAY AS DIRECTED 10.2 g 11  . theophylline (UNIPHYL) 400 MG 24 hr tablet TAKE ONE-HALF TABLET BY MOUTH EVERY DAY 45 tablet 0  . tolterodine (DETROL LA) 4 MG 24 hr capsule Take 4 mg by mouth daily.    Marland Kitchen DEXILANT 60 MG capsule TAKE 1 CAPSULE EVERY DAY (Patient not taking: Reported on 10/16/2017) 60 capsule 2  . gabapentin (NEURONTIN) 100 MG capsule Take 2 capsules (200 mg total) by mouth 3 (three) times daily. (Patient not taking: Reported on 09/02/2015) 1 capsule 0  . sodium chloride (OCEAN) 0.65 % nasal spray 1 spray by Nasal route as needed.      . Wheat Dextrin (BENEFIBER DRINK MIX PO) Take 1 packet by mouth.     No current facility-administered medications for this visit.      Past Medical History:  Diagnosis Date  . Allergy   . Asthma   . Cancer ([place])    lung  . Colonic polyp   . GERD (gastroesophageal reflux disease)   . Hyperlipidemia   . Hypertension   . Osteoporosis    osteoarthritis  . Urinary incontinence   . Vertigo    chronic    Past Surgical History:  Procedure Laterality Date  . APPENDECTOMY    . BLADDER REPAIR    . CHOLECYSTECTOMY    . esophageal hernia  01/2007   repaired x 4  . EYE SURGERY     cataract extraction, bilateral  . JOINT REPLACEMENT  02/2010   Left THR  . JOINT REPLACEMENT  5/12   Right THR--Dr Hooten  . VAGINAL HYSTERECTOMY      Family History  Problem Relation Age of Onset  . Stroke Father   . Pulmonary embolism Father   No bleeding disorders No aneurysms  Social History Social History   Tobacco Use  . Smoking status: Never Smoker  .  Smokeless tobacco: Never Used  Substance Use Topics  . Alcohol use: No    Alcohol/week: 0.0 oz  . Drug use: No  Widowed  Allergies  Allergen Reactions  . Promethazine Hcl     Psychotic reaction----tolerates zofran  . Lactose Diarrhea  . Scopolamine     Rash to pills        REVIEW OF SYSTEMS (Negative unless checked)  Constitutional: [] Weight loss  [] Fever  [] Chills Cardiac: [] Chest pain   [] Chest pressure   [] Palpitations   [] Shortness of breath when laying flat   [] Shortness of breath at rest   [] Shortness of breath with exertion. Vascular:  [] Pain in legs with walking   [] Pain in legs at rest   [] Pain in legs when laying flat   [] Claudication   [] Pain in feet when walking  [] Pain in feet at rest  [] Pain in feet when laying flat   [] History of DVT   [] Phlebitis   [] Swelling in legs   [] Varicose veins   [] Non-healing ulcers Pulmonary:   [] Uses home oxygen   [] Productive cough   [] Hemoptysis   [] Wheeze  [] COPD   [] Asthma Neurologic:  [x] Dizziness  [] Blackouts   [] Seizures   [] History of stroke   [] History of TIA  [] Aphasia   [] Temporary blindness   [] Dysphagia   [] Weakness or numbness in arms   [] Weakness or numbness in legs Musculoskeletal:  [x] Arthritis   [] Joint swelling   [x] Joint pain   [] Low back pain Hematologic:  [] Easy bruising  [] Easy bleeding   [] Hypercoagulable state   [] Anemic  [] Hepatitis  Gastrointestinal:  [] Blood in stool   [] Vomiting blood  [] Gastroesophageal reflux/heartburn   [] Difficulty swallowing   Genitourinary:  [] Chronic kidney disease   [] Difficult urination  [] Frequent urination  [] Burning with urination   [] Blood in urine Skin:  [] Rashes   [] Ulcers   [] Wounds Psychological:  [] History of anxiety   []  History of major depression.  Physical Exam BP (!) 174/77 (BP Location: Right Arm)   Pulse 77   Ht 4\' 11"  (1.499 m)   Wt 51 kg (112 lb 6.4 oz)   HC 16" (40.6 cm)   BMI 22.70 kg/m   Gen:  WD/WN, NAD. Appears much younger than stated age. Head: [place]/AT,  No temporalis wasting.  Ear/Nose/Throat: Hearing grossly intact, nares w/o erythema or drainage, oropharynx w/o Erythema/Exudate Eyes: Sclera non-icteric, conjunctiva clear Neck: Trachea midline.  No bruits.  Pulmonary:  Good air movement, no use of  accessory muscles.  Cardiac: RRR, no JVD Vascular:  Vessel Right Left  Radial Palpable Palpable                          PT Palpable Palpable  DP Palpable Palpable   Musculoskeletal: M/S 5/5 throughout.  Extremities without ischemic changes.  No deformity or atrophy. No edema.  Neurologic:  Sensation grossly intact in extremities.  Symmetrical.  Speech is fluent. Motor exam as listed above. Psychiatric: Judgment intact, Mood & affect appropriate for pt's clinical situation. Dermatologic: No rashes or ulcers noted.  No cellulitis or open wounds.  Radiology Mr Jodene Nam Head Wo Contrast  Result Date: 09/28/2017 CLINICAL DATA:  Followup right anterior cerebral artery aneurysm. Falling with trauma to the head. EXAM: MRA HEAD WITHOUT CONTRAST TECHNIQUE: Angiographic images of the Circle of Willis were obtained using MRA technique without intravenous contrast. COMPARISON:  03/02/2017 FINDINGS: No change since the previous examination. Both internal carotid arteries are widely patent through the skull base and siphon regions. The anterior and middle cerebral vessels are patent without proximal stenosis, circle of Willis aneurysm or vascular malformation. Mild fusiform dilatation of the right A2 segment is unchanged, maximal diameter no more than 2 mm. Both vertebral arteries are patent to the basilar with the left being dominant. No basilar stenosis. Posterior circulation branch vessels appear normal. IMPRESSION: No change. Mild fusiform dilatation of the A2 segment of the right anterior cerebral artery, maximal diameter 2 mm. This would be a low risk lesion. Electronically Signed   By: Nelson Chimes M.D.   On: 09/28/2017 13:44   US Carotid  Bilateral  Result Date: 09/28/2017 CLINICAL DATA:  Carotid calcifications noted on radiograph. EXAM: BILATERAL CAROTID DUPLEX ULTRASOUND TECHNIQUE: Pearline Cables scale imaging, color Doppler and duplex ultrasound were performed of bilateral carotid and vertebral arteries in the neck. COMPARISON:  None. FINDINGS: Criteria: Quantification of carotid stenosis is based on velocity parameters that correlate the residual internal carotid diameter with NASCET-based stenosis levels, using the diameter of the distal internal carotid lumen as the denominator for stenosis measurement. The following velocity measurements were obtained: RIGHT ICA:  64 cm/sec CCA:  64 cm/sec SYSTOLIC ICA/CCA RATIO:  1.0 DIASTOLIC ICA/CCA RATIO:  1.8 ECA:  88 cm/sec LEFT ICA:  79 cm/sec CCA:  84 cm/sec SYSTOLIC ICA/CCA RATIO:  0.9 DIASTOLIC ICA/CCA RATIO:  1.4 ECA:  95 cm/sec RIGHT CAROTID ARTERY: Mild calcified plaque in the bulb. Low resistance internal carotid Doppler pattern is preserved. RIGHT VERTEBRAL ARTERY:  Antegrade. LEFT CAROTID ARTERY: Moderate calcified plaque in the bulb. Mild scattered calcified plaque in the upper common carotid artery. Low resistance internal carotid Doppler pattern is preserved. LEFT VERTEBRAL ARTERY:  Antegrade. IMPRESSION: Less than 50% stenosis in the right and left internal carotid arteries. Calcified plaque is noted in both bulbs. Electronically Signed   By: Marybelle Killings M.D.   On: 09/28/2017 14:36    Labs No results found for this or any previous visit (from the past 2160 hour(s)).  Assessment/Plan:  Essential hypertension, benign blood pressure control important in reducing the progression of atherosclerotic disease. On appropriate oral medications.   Hyperlipidemia lipid control important in reducing the progression of atherosclerotic disease. Continue statin therapy   Berry aneurysm Small.  Has been getting this checked every 6 months.  We can plan another MRI in 6 months and eventually will  probably transition to an annual follow-up.  Carotid stenosis A recent carotid artery duplex suggested less than 50%  carotid artery stenosis bilaterally.  Continue aspirin and Lipitor.  No role for intervention.  Plan to recheck this in 1 year with duplex.     Leotis Pain 10/16/2017, 10:12 AM   This note was created with [facility] medical dictation system.  Any errors from dictation are unintentional.

## 2017-12-18 ENCOUNTER — Ambulatory Visit
Admission: RE | Admit: 2017-12-18 | Discharge: 2017-12-18 | Disposition: A | Discharge status: Home / Self Care | Payer: Medicare Other | Source: Ambulatory Visit | Attending: Dermatology | Admitting: Dermatology

## 2017-12-18 ENCOUNTER — Other Ambulatory Visit: Payer: Self-pay | Admitting: Dermatology

## 2017-12-18 DIAGNOSIS — M89312 Hypertrophy of bone, left shoulder: Secondary | ICD-10-CM

## 2017-12-18 DIAGNOSIS — M898X9 Other specified disorders of bone, unspecified site: Secondary | ICD-10-CM

## 2017-12-18 DIAGNOSIS — M5030 Other cervical disc degeneration, unspecified cervical region: Secondary | ICD-10-CM | POA: Diagnosis not present

## 2017-12-18 DIAGNOSIS — M5134 Other intervertebral disc degeneration, thoracic region: Secondary | ICD-10-CM | POA: Diagnosis not present

## 2017-12-18 DIAGNOSIS — Z85118 Personal history of other malignant neoplasm of bronchus and lung: Secondary | ICD-10-CM | POA: Diagnosis not present

## 2018-03-15 ENCOUNTER — Other Ambulatory Visit (INDEPENDENT_AMBULATORY_CARE_PROVIDER_SITE_OTHER): Payer: Self-pay | Admitting: Vascular Surgery

## 2018-03-15 ENCOUNTER — Other Ambulatory Visit (INDEPENDENT_AMBULATORY_CARE_PROVIDER_SITE_OTHER): Payer: Self-pay

## 2018-03-15 DIAGNOSIS — I671 Cerebral aneurysm, nonruptured: Secondary | ICD-10-CM

## 2018-04-09 ENCOUNTER — Ambulatory Visit
Admission: RE | Admit: 2018-04-09 | Discharge: 2018-04-09 | Disposition: A | Discharge status: Home / Self Care | Payer: Medicare Other | Source: Ambulatory Visit | Attending: Vascular Surgery | Admitting: Vascular Surgery

## 2018-04-09 DIAGNOSIS — I671 Cerebral aneurysm, nonruptured: Secondary | ICD-10-CM | POA: Insufficient documentation

## 2018-04-09 LAB — POCT I-STAT CREATININE: CREATININE: 1 mg/dL (ref 0.44–1.00)

## 2018-04-09 MED ORDER — GADOBENATE DIMEGLUMINE 529 MG/ML IV SOLN
10.0000 mL | Freq: Once | INTRAVENOUS | Status: AC | PRN
  Administered 2018-04-09: 10 mL via INTRAVENOUS

## 2018-05-09 ENCOUNTER — Other Ambulatory Visit: Payer: Self-pay | Admitting: Surgery

## 2018-05-09 DIAGNOSIS — R1030 Lower abdominal pain, unspecified: Secondary | ICD-10-CM

## 2018-05-15 ENCOUNTER — Ambulatory Visit
Admission: RE | Admit: 2018-05-15 | Discharge: 2018-05-15 | Disposition: A | Discharge status: Home / Self Care | Payer: Medicare Other | Source: Ambulatory Visit | Attending: Surgery | Admitting: Surgery

## 2018-05-15 DIAGNOSIS — R1030 Lower abdominal pain, unspecified: Secondary | ICD-10-CM | POA: Diagnosis present

## 2018-05-15 DIAGNOSIS — N200 Calculus of kidney: Secondary | ICD-10-CM | POA: Insufficient documentation

## 2018-05-15 DIAGNOSIS — I700 Atherosclerosis of aorta: Secondary | ICD-10-CM | POA: Insufficient documentation

## 2018-05-15 LAB — POCT I-STAT CREATININE: CREATININE: 0.8 mg/dL (ref 0.44–1.00)

## 2018-05-15 MED ORDER — IOPAMIDOL (ISOVUE-300) INJECTION 61%
80.0000 mL | Freq: Once | INTRAVENOUS | Status: AC | PRN
  Administered 2018-05-15: 80 mL via INTRAVENOUS

## 2018-10-15 ENCOUNTER — Ambulatory Visit (INDEPENDENT_AMBULATORY_CARE_PROVIDER_SITE_OTHER): Payer: Medicare Other | Admitting: Vascular Surgery

## 2018-10-15 ENCOUNTER — Encounter (INDEPENDENT_AMBULATORY_CARE_PROVIDER_SITE_OTHER): Payer: Self-pay | Admitting: Vascular Surgery

## 2018-10-15 ENCOUNTER — Ambulatory Visit (INDEPENDENT_AMBULATORY_CARE_PROVIDER_SITE_OTHER): Payer: Medicare Other

## 2018-10-15 VITALS — BP 182/81 | HR 60 | Resp 14 | Ht 59.0 in | Wt 112.8 lb

## 2018-10-15 DIAGNOSIS — I6523 Occlusion and stenosis of bilateral carotid arteries: Secondary | ICD-10-CM | POA: Diagnosis not present

## 2018-10-15 DIAGNOSIS — E785 Hyperlipidemia, unspecified: Secondary | ICD-10-CM

## 2018-10-15 DIAGNOSIS — I10 Essential (primary) hypertension: Secondary | ICD-10-CM

## 2018-10-15 DIAGNOSIS — I671 Cerebral aneurysm, nonruptured: Secondary | ICD-10-CM | POA: Diagnosis not present

## 2018-10-15 NOTE — Assessment & Plan Note (Signed)
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  Recheck in a year

## 2018-10-15 NOTE — Assessment & Plan Note (Signed)
Her carotid duplex today shows intimal thickening and mild disease bilaterally without significant progression from previous studies. Recheck in one year with duplex and MRI of the head again.

## 2018-10-15 NOTE — Progress Notes (Signed)
MRN : [id number]  Jennifer Roman is a 85 y.o. ([birth year]) female who presents with chief complaint of  Chief Complaint  Patient presents with  . Follow-up  .  History of Present Illness: patient returns in follow up of her carotid disease.  No new issues or problems.  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  Her carotid duplex today shows intimal thickening and mild disease bilaterally without significant progression from previous studies.  Current Outpatient Medications  Medication Sig Dispense Refill  . acetaminophen (TYLENOL) 500 MG tablet Take 500 mg by mouth as needed.     Marland Kitchen albuterol (PROVENTIL,VENTOLIN) 90 MCG/ACT inhaler Inhale 2 puffs into the lungs every 6 (six) hours as needed. 17 g 1  . aspirin EC 81 MG tablet Take 81 mg by mouth daily.    Marland Kitchen atorvastatin (LIPITOR) 10 MG tablet Take 10 mg by mouth daily.    Marland Kitchen BIOTIN PO Take by mouth.    . budesonide-formoterol (SYMBICORT) 80-4.5 MCG/ACT inhaler Inhale 2 puffs into the lungs as needed.     . calcium citrate-vitamin D (CITRACAL+D) 315-200 MG-UNIT tablet Take 1 tablet by mouth 2 (two) times daily.    . cetirizine (ZYRTEC) 10 MG tablet Take 10 mg by mouth daily as needed.     . famotidine (PEPCID) 40 MG tablet Take by mouth daily.    Marland Kitchen losartan (COZAAR) 100 MG tablet TAKE ONE TABLET BY MOUTH EVERY DAY 30 tablet 11  . Melatonin 3 MG TABS Take by mouth daily.     . Misc Natural Products (TURMERIC CURCUMIN) CAPS Take 1 capsule by mouth daily. 1000MG  DAILY    . mometasone (NASONEX) 50 MCG/ACT nasal spray Place 2 sprays into the nose as needed. 17 g 3  . ondansetron (ZOFRAN-ODT) 4 MG disintegrating tablet DISSOLVE 1 TABLET IN MOUTH EVERY 12 HOURS AS NEEDED FOR NAUSEA OR VOMITING 30 tablet 0  . sodium chloride (OCEAN) 0.65 % nasal spray 1 spray by Nasal route as needed.      . SYMBICORT 80-4.5 MCG/ACT inhaler INHALE 2 PUFFS INTO THE LUNGS 2  TIMES A DAY AS DIRECTED 10.2 g 11  . theophylline (UNIPHYL) 400 MG 24 hr tablet TAKE ONE-HALF TABLET BY MOUTH EVERY DAY 45 tablet 0  . tolterodine (DETROL LA) 4 MG 24 hr capsule Take 4 mg by mouth daily.    . Wheat Dextrin (BENEFIBER DRINK MIX PO) Take 1 packet by mouth.     No current facility-administered medications for this visit.     Past Medical History:  Diagnosis Date  . Allergy   . Asthma   . Cancer ([place])    lung  . Colonic polyp   . GERD (gastroesophageal reflux disease)   . Hyperlipidemia   . Hypertension   . Osteoporosis    osteoarthritis  . Urinary incontinence   . Vertigo    chronic    Past Surgical History:  Procedure Laterality Date  . APPENDECTOMY    . BLADDER REPAIR    . CHOLECYSTECTOMY    . esophageal hernia  01/2007   repaired x 4  . EYE SURGERY     cataract extraction, bilateral  . JOINT REPLACEMENT  02/2010   Left THR  . JOINT REPLACEMENT  5/12   Right THR--Dr Hooten  . VAGINAL HYSTERECTOMY     Family History  Problem Relation Age of Onset  .  Stroke Father   . Pulmonary embolism Father   No bleeding disorders No aneurysms  Social History Social History        Tobacco Use  . Smoking status: Never Smoker  . Smokeless tobacco: Never Used  Substance Use Topics  . Alcohol use: No    Alcohol/week: 0.0 oz  . Drug use: No  Widowed       Allergies  Allergen Reactions  . Promethazine Hcl     Psychotic reaction----tolerates zofran  . Lactose Diarrhea  . Scopolamine     Rash to pills        REVIEW OF SYSTEMS (Negative unless checked)  Constitutional: [] ?Weight loss  [] ?Fever  [] ?Chills Cardiac: [] ?Chest pain   [] ?Chest pressure   [] ?Palpitations   [] ?Shortness of breath when laying flat   [] ?Shortness of breath at rest   [] ?Shortness of breath with exertion. Vascular:  [] ?Pain in legs with walking   [] ?Pain in legs at rest   [] ?Pain in legs when laying flat   [] ?Claudication   [] ?Pain in feet when walking   [] ?Pain in feet at rest  [] ?Pain in feet when laying flat   [] ?History of DVT   [] ?Phlebitis   [] ?Swelling in legs   [] ?Varicose veins   [] ?Non-healing ulcers Pulmonary:   [] ?Uses home oxygen   [] ?Productive cough   [] ?Hemoptysis   [] ?Wheeze  [] ?COPD   [] ?Asthma Neurologic:  [x] ?Dizziness  [] ?Blackouts   [] ?Seizures   [] ?History of stroke   [] ?History of TIA  [] ?Aphasia   [] ?Temporary blindness   [] ?Dysphagia   [] ?Weakness or numbness in arms   [] ?Weakness or numbness in legs Musculoskeletal:  [x] ?Arthritis   [] ?Joint swelling   [x] ?Joint pain   [] ?Low back pain Hematologic:  [] ?Easy bruising  [] ?Easy bleeding   [] ?Hypercoagulable state   [] ?Anemic  [] ?Hepatitis  Gastrointestinal:  [] ?Blood in stool   [] ?Vomiting blood  [] ?Gastroesophageal reflux/heartburn   [] ?Difficulty swallowing   Genitourinary:  [] ?Chronic kidney disease   [] ?Difficult urination  [] ?Frequent urination  [] ?Burning with urination   [] ?Blood in urine Skin:  [] ?Rashes   [] ?Ulcers   [] ?Wounds Psychological:  [] ?History of anxiety   [] ? History of major depression.    Physical Examination  Vitals:   10/15/18 1122  BP: (!) 182/81  Pulse: 60  Resp: 14  Weight: 112 lb 12.8 oz (51.2 kg)  Height: 4\' 11"  (1.499 m)   Body mass index is 22.78 kg/m. Gen:  WD/WN, NAD.  Appears far younger than stated age Head: [place]/AT, No temporalis wasting. Ear/Nose/Throat: Hearing grossly intact, nares w/o erythema or drainage, trachea midline Eyes: Conjunctiva clear. Sclera non-icteric Neck: Supple.  No bruit  Pulmonary:  Good air movement, equal and clear to auscultation bilaterally.  Cardiac: RRR, No JVD Vascular:  Vessel Right Left  Radial Palpable Palpable               Musculoskeletal: M/S 5/5 throughout.  No deformity or atrophy. No edema. Neurologic: CN 2-12 intact. Sensation grossly intact in extremities.  Symmetrical.  Speech is fluent. Motor exam as listed above. Psychiatric: Judgment intact, Mood & affect appropriate for  pt's clinical situation. Dermatologic: No rashes or ulcers noted.  No cellulitis or open wounds.      CBC Lab Results  Component Value Date   WBC 11.4 (H) 03/22/2015   HGB 13.8 03/22/2015   HCT 42.3 03/22/2015   MCV 88.9 03/22/2015   PLT 294 03/22/2015    BMET    Component Value Date/Time   NA 136  03/22/2015 0700   K 3.7 03/22/2015 0700   CL 99 (L) 03/22/2015 0700   CO2 26 03/22/2015 0700   GLUCOSE 170 (H) 03/22/2015 0700   BUN 14 03/22/2015 0700   CREATININE 0.80 05/15/2018 1505   CALCIUM 10.1 03/22/2015 0700   GFRNONAA >60 03/22/2015 0700   GFRAA >60 03/22/2015 0700   CrCl cannot be calculated (Patient's most recent lab result is older than the maximum 21 days allowed.).  COAG No results found for: INR, PROTIME  Radiology No results found.    Assessment/Plan Essential hypertension, benign blood pressure control important in reducing the progression of atherosclerotic disease. On appropriate oral medications.   Hyperlipidemia lipid control important in reducing the progression of atherosclerotic disease. 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  Recheck in a year  Carotid stenosis Her carotid duplex today shows intimal thickening and mild disease bilaterally without significant progression from previous studies. Recheck in one year with duplex and MRI of the head again.    Leotis Pain, MD  10/15/2018 11:44 AM    This note was created with Dragon medical transcription system.  Any errors from dictation are purely unintentional

## 2018-10-17 ENCOUNTER — Telehealth: Payer: Self-pay | Admitting: *Deleted

## 2018-10-17 NOTE — Telephone Encounter (Signed)
Returned the patient call regarding a follow up appt. Patient stated that "I have had a CT and the results are worrisome for neoplasm. But I also have a kidney stone with issue. I need to see an Urologist, but I am worried they won't work up the kidney stone until I get the tumor out. That's why I want an appt. I am starting to have symptoms and have lost 10lbs in a year." Spent over 20 minutes on the phone with the patient. Per the patient Dr. Lucia Gaskins said I need to follow with Dr.Rossi. Explained to the patient that we will contact Dr.Newman office for his last note and scans for her appt, patient stated "I don't see him anymore and he couldn't help. He said so himself." Tried to explain to the patient that Dr. Denman George would like to review those before she sees her, patient stated "well there will not be anything notes, all he did was pull up the images and showed them to me." Each time I spoke about retrieving records from other physicians the patient would push back. Tried again to explain to the patient  "we would like those records to review we see her and ae you saying we can't request your records." The patient relied "No, you can get my records." Patient does have some records that she will bring with her the day of the appt. Patient request information sent to her regarding the appt and directions.

## 2018-10-18 ENCOUNTER — Telehealth: Payer: Self-pay | Admitting: *Deleted

## 2018-10-18 NOTE — Telephone Encounter (Signed)
I have mailed the patient a calendar, map and directions to the center

## 2018-10-25 ENCOUNTER — Encounter: Payer: Self-pay | Admitting: Gynecologic Oncology

## 2018-10-25 ENCOUNTER — Inpatient Hospital Stay: Payer: Medicare Other | Attending: Gynecologic Oncology | Admitting: Gynecologic Oncology

## 2018-10-25 VITALS — BP 180/55 | HR 69 | Temp 98.7°F | Resp 20 | Ht 59.0 in | Wt 110.6 lb

## 2018-10-25 DIAGNOSIS — D3911 Neoplasm of uncertain behavior of right ovary: Secondary | ICD-10-CM | POA: Diagnosis present

## 2018-10-25 DIAGNOSIS — N200 Calculus of kidney: Secondary | ICD-10-CM

## 2018-10-25 DIAGNOSIS — R1031 Right lower quadrant pain: Secondary | ICD-10-CM | POA: Diagnosis not present

## 2018-10-25 DIAGNOSIS — N838 Other noninflammatory disorders of ovary, fallopian tube and broad ligament: Secondary | ICD-10-CM

## 2018-10-25 DIAGNOSIS — R978 Other abnormal tumor markers: Secondary | ICD-10-CM | POA: Diagnosis not present

## 2018-10-25 DIAGNOSIS — R971 Elevated cancer antigen 125 [CA 125]: Secondary | ICD-10-CM

## 2018-10-25 DIAGNOSIS — Z90710 Acquired absence of both cervix and uterus: Secondary | ICD-10-CM

## 2018-10-25 NOTE — Patient Instructions (Signed)
Dr Serita Grit office will contact the urologist to establish a consultation.  She will see you back in her office for a visit after that urology consultation to discuss the ongoing plan.

## 2018-10-25 NOTE — Progress Notes (Signed)
New Patient Note: Gyn-Onc  Consult was initially requested by Dr. Corinna Capra in 2016 for the evaluation of Jennifer Roman 85 y.o. female  CC:  Chief Complaint  Patient presents with  . Ovarian mass, right    Assessment/Plan:  [name]  is a 85 y.o.  year old with a stable solid right ovarian mass (3 cm).  She has right sided abdominal/flank pain and a right sided 34mm kidney stone.  Her right ovarian cyst is stable in size since 2016, and therefore, I do not believe that it is malignant. I favor benign pathology.  Most of poorly the patient does not desire abdominal surgery, is at high surgical risk for complications given her age and extensive prior surgical history, and strongly does not want adjuvant therapy such as chemotherapy if she doesn't fact have an ovarian cancer.   The question is largely whether or not her pain symptoms are from this cyst or from the 36mm renal stone. The patient feels strongly that her pain is similar to when she had kidney stones in the past and therefore feels that this is the cause. However, I discussed that not all stones cause pain and this one is quite small and non-obstructing and therefore may not require intervention or be the source of her pain. Therefore, we will have her seen by a urologist to discuss this further.  With respect to her ovary, if the urolgoist feels that this pain is not from her kidney stone, or if the urologist removes the stone and her pain persists, it might be worthwhile considering surgery at her direction. It is quite possible that it is not the cyst itself, but rather adhesed bowel beside the ovary (where there is a transition point on imaging) that is the cause of her symptoms.  I feel that surgery would be very risky (particularly risky for bowel injury, or needing conversion from laparoscopy to laparotomy). We would need to enter in the right upper quadrant, as I feel her left upper quadrant likely contains significant  adhesions. If extensive small bowel enterolysis is necessary in order to visualize the cyst, this would need to take place via laparotomy. A laparotomy would be a fairly radical intervention for an 85 year old patient with a 3cm ovarian cyst that has been stable over 4 years, and a background of vascular disease.  Ms Witkop will see me back after seeing her urologist to discuss management going forwards.   HPI: Jennifer Roman is an 85 year old para swollen woman (G4 P4) who seen in consultation at the request of Dr. Corinna Capra for right-sided ovarian mass. The patient had an incidentally found ovarian mass on CT scan performed on 03/22/2015 as part of workup for epigastric pain. At this time the mass that was described was a complex 3.7 x 3.1 x 2.9 cm mass arising from the right ovary. On that CT scan there were no signs concerning for metastatic ovarian cancer. A CA-125 was drawn at that time and was mildly elevated at 33 units per milliliter (upper limit of normal 38 units per milliliter). Of interest her CA-19-9 was mildly elevated at 42.  She was told that she had ovarian cancer however the patient did not want intervention at that time and therefore declined further follow-up, workup, treatment.  In the intervening time. She has had progressive epigastric discomfort. It is a little unclear if Y that she found her way to Dr. Gregor Hams office for further evaluation of this right adnexal mass  in December 2016. At that time a transvaginal ultrasound was performed. It reveals surgically absent uterus. No left adnexal masses were seen. The right adnexa measured 5.2 x 4.1 x 5.3 cm with a complex mass appearing mostly solid with some cystic areas, and a small amount of blood flow seen within the mass does not sleep free fluid seen. CA-125 was not redraw on.  The patient is asymptomatic from this mass that may occasionally have slight menstrual cramp-like sensations.  Of note she has a very significant surgical history.  Her surgeries include a vaginal hysterectomy for benign indications. An open appendectomy, and open cholecystectomy, multiple suspensions of her bladder but unlikely were done vaginally. She is also had a diaphragmatic hernia repair approximately 10-15 years ago that was performed laparoscopically at [facility] and was complicated requiring at least 2 reoperations laparoscopically to correct the issue. Since that time she's had persistent epigastric pain and difficulty digesting food. She is persistent discomfort from this.  She is otherwise fairly healthy with some asthma, and hypertension. She has vertigo. She has a remote history of stage I lung cancer treated with surgery.  She lives alone in assisted living facility. She has no help at home. She is reactive with a good quality of life and strongly desires to maintain this. The patient has an active living will which states 3 things specifically #1 she does not desire any surgery, #2 she does not desire any major intervention such as chemotherapy or radiation, #3 she desires quality of life considerations over prolongation of life.  She decided on expectant management rather than surgical intervention. On 09/01/16 CA 125 was drawn and was 22. On 11/28/13 she underwent transvaginal US to re-evaluate the cyst. "It showed a stable cyst: 2.3 x 2.1 x 2.1 cm complex cystic mass with thick septations and nodularity worrisome for right ovarian neoplasm. Left ovary not visualized." Her CA 125 on 12/02/15 was 22.6.  It was felt that the cyst was stable, benign and asymptomatic and therefore surgery was not performed as the risk was felt to outweigh the benefits.    Interval Hx:  Over the subsequent 2+ years she developed slight increase in the intensity of the right lower joint pain.  He describes the pain as being a Grapey colic-like pain that starts in the right lower quadrant and wraps around to the right flank.  She describes it as being exactly like what  kidney stone pain had felt for her many years ago when she had a kidney stone. Sometimes the bouts last 20 hours and are severe.   She underwent a CT scan of the abdomen and pelvis to work this pain up on May 15, 2018.  This revealed a nonobstructing stone in the inferior collecting system of the right kidney measuring 6 mm that was new from her prior scan.  There was borderline dilated small bowel throughout the length of the intestines.  There is relative decompression of the colon.  Transition of the small bowel occurred in the pelvis immediately adjacent to the right ovarian cyst.  Atherosclerosis was present.  In the pelvis a right-sided 3.6 cm ovarian cyst was seen (it had increased 30mm from previous study). No ascites, carcinomatosis or lymphadenopathy was seen.    Current Meds:  Outpatient Encounter Medications as of 10/25/2018  Medication Sig  . acetaminophen (TYLENOL) 500 MG tablet Take 500 mg by mouth as needed.   Marland Kitchen albuterol (PROVENTIL,VENTOLIN) 90 MCG/ACT inhaler Inhale 2 puffs into the lungs every 6 (six)  hours as needed.  Marland Kitchen aspirin EC 81 MG tablet Take 81 mg by mouth daily.  Marland Kitchen atorvastatin (LIPITOR) 10 MG tablet Take 10 mg by mouth daily.  Marland Kitchen BIOTIN PO Take by mouth daily.   . budesonide-formoterol (SYMBICORT) 80-4.5 MCG/ACT inhaler Inhale 2 puffs into the lungs as needed.   . calcium citrate-vitamin D (CITRACAL+D) 315-200 MG-UNIT tablet Take 1 tablet by mouth 2 (two) times daily.  . cetirizine (ZYRTEC) 10 MG tablet Take 10 mg by mouth daily as needed.   . famotidine (PEPCID) 40 MG tablet Take by mouth daily.  Marland Kitchen losartan (COZAAR) 100 MG tablet TAKE ONE TABLET BY MOUTH EVERY DAY  . Melatonin 3 MG TABS Take 10 mg by mouth daily.   . Misc Natural Products (TURMERIC CURCUMIN) CAPS Take 1 capsule by mouth daily. 1000MG  DAILY  . mometasone (NASONEX) 50 MCG/ACT nasal spray Place 2 sprays into the nose as needed.  . ondansetron (ZOFRAN-ODT) 4 MG disintegrating tablet DISSOLVE 1 TABLET  IN MOUTH EVERY 12 HOURS AS NEEDED FOR NAUSEA OR VOMITING  . sodium chloride (OCEAN) 0.65 % nasal spray 1 spray by Nasal route as needed.    . SYMBICORT 80-4.5 MCG/ACT inhaler INHALE 2 PUFFS INTO THE LUNGS 2 TIMES A DAY AS DIRECTED  . theophylline (UNIPHYL) 400 MG 24 hr tablet TAKE ONE-HALF TABLET BY MOUTH EVERY DAY  . tolterodine (DETROL LA) 4 MG 24 hr capsule Take 4 mg by mouth daily.  . Wheat Dextrin (BENEFIBER DRINK MIX PO) Take 1 packet by mouth.   No facility-administered encounter medications on file as of 10/25/2018.     Allergy:  Allergies  Allergen Reactions  . Promethazine Hcl     Psychotic reaction----tolerates zofran  . Lactose Diarrhea  . Scopolamine     Rash to pills    Social Hx:   Social History   Socioeconomic History  . Marital status: Widowed    Spouse name: Not on file  . Number of children: 4  . Years of education: Not on file  . Highest education level: Not on file  Occupational History  . Occupation: Retired, Barista: RETIRED  Social Needs  . Financial resource strain: Not on file  . Food insecurity:    Worry: Not on file    Inability: Not on file  . Transportation needs:    Medical: Not on file    Non-medical: Not on file  Tobacco Use  . Smoking status: Never Smoker  . Smokeless tobacco: Never Used  Substance and Sexual Activity  . Alcohol use: No    Alcohol/week: 0.0 standard drinks  . Drug use: No  . Sexual activity: Not on file  Lifestyle  . Physical activity:    Days per week: Not on file    Minutes per session: Not on file  . Stress: Not on file  Relationships  . Social connections:    Talks on phone: Not on file    Gets together: Not on file    Attends religious service: Not on file    Active member of club or organization: Not on file    Attends meetings of clubs or organizations: Not on file    Relationship status: Not on file  . Intimate partner violence:    Fear of current or ex partner: Not on  file    Emotionally abused: Not on file    Physically abused: Not on file    Forced sexual activity: Not on file  Other Topics Concern  . Not on file  Social History Narrative   Has living will   Son [name] is health care POA.   Has DNR already.   No feeding tube if cognitively unaware          Past Surgical Hx:  Past Surgical History:  Procedure Laterality Date  . APPENDECTOMY    . BLADDER REPAIR    . CHOLECYSTECTOMY    . esophageal hernia  01/2007   repaired x 4  . EYE SURGERY     cataract extraction, bilateral  . JOINT REPLACEMENT  02/2010   Left THR  . JOINT REPLACEMENT  5/12   Right THR--Dr Hooten  . VAGINAL HYSTERECTOMY      Past Medical Hx:  Past Medical History:  Diagnosis Date  . Allergy   . Asthma   . Cancer ([place])    lung  . Colonic polyp   . GERD (gastroesophageal reflux disease)   . Hyperlipidemia   . Hypertension   . Osteoporosis    osteoarthritis  . Urinary incontinence   . Vertigo    chronic    Past Gynecological History:  SVD x 4  No LMP recorded. Patient has had a hysterectomy.  Family Hx:  Family History  Problem Relation Age of Onset  . Stroke Father   . Pulmonary embolism Father     Review of Systems:  Constitutional  Feels well,    ENT Normal appearing ears and nares bilaterally Skin/Breast  No rash, sores, jaundice, itching, dryness Cardiovascular  No chest pain, shortness of breath, or edema  Pulmonary  No cough or wheeze.  Gastro Intestinal  No nausea, vomitting, or diarrhoea. No bright red blood per rectum,+ abdominal pain, change in bowel movement, or constipation.  Genito Urinary  No frequency, urgency, dysuria, see HPI Musculo Skeletal  No myalgia, arthralgia, joint swelling or pain  Neurologic  No weakness, numbness, change in gait,  Psychology  No depression, anxiety, insomnia.   Vitals:  Blood pressure (!) 180/55, pulse 69, temperature 98.7 F (37.1 C), temperature source Oral, resp. rate 20, height 4'  11" (1.499 m), weight 110 lb 9.6 oz (50.2 kg), SpO2 97 %.  Physical Exam: WD in NAD Resp: in no pulm distress Extremities: no edema. Pelvic: Normal external genitalia, grossly normal appearing vagina, bimanual examination reveals some tenderness in the right lower quadrant but no palpable masses.  The uterus is surgically absent as is the cervix. Extremities: no edema.    Thereasa Solo, MD  10/25/2018, 5:22 PM

## 2018-10-28 ENCOUNTER — Encounter: Payer: Self-pay | Admitting: Oncology

## 2018-10-28 ENCOUNTER — Telehealth: Payer: Self-pay | Admitting: Oncology

## 2018-10-28 DIAGNOSIS — N838 Other noninflammatory disorders of ovary, fallopian tube and broad ligament: Secondary | ICD-10-CM

## 2018-10-28 NOTE — Telephone Encounter (Signed)
Lamarr Lulas and scheduled an appointment with Dr. Denman George on 11/22/2018 at 3 pm.

## 2018-10-28 NOTE — Progress Notes (Signed)
[place] regarding appointment for patient to evaluate right renal stone.  They requested for a referral to be entered in Epic.

## 2018-11-21 ENCOUNTER — Ambulatory Visit: Payer: Self-pay | Admitting: Urology

## 2018-11-21 ENCOUNTER — Telehealth: Payer: Self-pay

## 2018-11-21 NOTE — Telephone Encounter (Signed)
Incoming call from pt regarding she had an appt scheduled with urologist but there was a problem with appt and will have to reschedule.  Pt said that was point of seeing Dr Denman George tomorrow to discuss the urologist appt.  Pt reports she hasn't had a kidney stone attack since before Christmas and prefers with "all the hysteria going on in the world right now" she prefers to cancel with Dr Denman George and will call us and urologist to reschedule if needed.  Per Joylene John NP, let pt know that this is ok for her to contact us if having any concerns/ symptoms and we can reschedule her.  Pt agreeable. No other needs per pt at this time.

## 2018-11-22 ENCOUNTER — Ambulatory Visit: Payer: Medicare Other | Admitting: Gynecologic Oncology

## 2019-10-07 ENCOUNTER — Telehealth (INDEPENDENT_AMBULATORY_CARE_PROVIDER_SITE_OTHER): Payer: Self-pay | Admitting: Vascular Surgery

## 2019-10-07 NOTE — Telephone Encounter (Signed)
Spoke with the patient and she wanted to be sure the order was in so she could have her MRI. I let her know that I was looking at the order and she already have an appt. Patient will see Dr. Lucky Cowboy on 10/17/19 with a carotid study as weel.

## 2019-10-14 ENCOUNTER — Other Ambulatory Visit: Payer: Self-pay

## 2019-10-14 ENCOUNTER — Ambulatory Visit
Admission: RE | Admit: 2019-10-14 | Discharge: 2019-10-14 | Disposition: A | Discharge status: Home / Self Care | Payer: Medicare Other | Source: Ambulatory Visit | Attending: Vascular Surgery | Admitting: Vascular Surgery

## 2019-10-14 DIAGNOSIS — I671 Cerebral aneurysm, nonruptured: Secondary | ICD-10-CM

## 2019-10-14 LAB — POCT I-STAT CREATININE: Creatinine, Ser: 0.7 mg/dL (ref 0.44–1.00)

## 2019-10-14 MED ORDER — GADOBUTROL 1 MMOL/ML IV SOLN
5.0000 mL | Freq: Once | INTRAVENOUS | Status: AC | PRN
  Administered 2019-10-14: 12:00:00 5 mL via INTRAVENOUS

## 2019-10-17 ENCOUNTER — Ambulatory Visit (INDEPENDENT_AMBULATORY_CARE_PROVIDER_SITE_OTHER): Payer: Medicare Other | Admitting: Vascular Surgery

## 2019-10-17 ENCOUNTER — Encounter (INDEPENDENT_AMBULATORY_CARE_PROVIDER_SITE_OTHER): Payer: Self-pay | Admitting: Vascular Surgery

## 2019-10-17 ENCOUNTER — Ambulatory Visit (INDEPENDENT_AMBULATORY_CARE_PROVIDER_SITE_OTHER): Payer: Medicare Other

## 2019-10-17 ENCOUNTER — Other Ambulatory Visit: Payer: Self-pay

## 2019-10-17 VITALS — BP 189/76 | HR 67 | Resp 16 | Ht <= 58 in | Wt 110.2 lb

## 2019-10-17 DIAGNOSIS — I671 Cerebral aneurysm, nonruptured: Secondary | ICD-10-CM

## 2019-10-17 DIAGNOSIS — I6523 Occlusion and stenosis of bilateral carotid arteries: Secondary | ICD-10-CM

## 2019-10-17 DIAGNOSIS — E785 Hyperlipidemia, unspecified: Secondary | ICD-10-CM

## 2019-10-17 DIAGNOSIS — I10 Essential (primary) hypertension: Secondary | ICD-10-CM

## 2019-10-17 NOTE — Progress Notes (Signed)
MRN : [id number]  Jennifer Roman is a 86 y.o. (09/27/1927) female who presents with chief complaint of  Chief Complaint  Patient presents with  . Follow-up    ultraosound follow up  .  History of Present Illness: Patient returns in follow-up of her carotid disease.  She has undergone an MRI which shows only chronic changes.  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  She is having some mild cognitive issues but remains very sharp for somebody in her 89th year.  Duplex of her carotids today show stable, mild carotid artery stenosis in the 1 to 39% range bilaterally  Current Outpatient Medications  Medication Sig Dispense Refill  . acetaminophen (TYLENOL) 500 MG tablet Take 500 mg by mouth as needed.     Marland Kitchen albuterol (PROVENTIL,VENTOLIN) 90 MCG/ACT inhaler Inhale 2 puffs into the lungs every 6 (six) hours as needed. 17 g 1  . aspirin EC 81 MG tablet Take 81 mg by mouth daily.    Marland Kitchen atorvastatin (LIPITOR) 10 MG tablet Take 10 mg by mouth daily.    Marland Kitchen BIOTIN PO Take by mouth daily.     . budesonide-formoterol (SYMBICORT) 80-4.5 MCG/ACT inhaler Inhale 2 puffs into the lungs as needed.     . calcium citrate-vitamin D (CITRACAL+D) 315-200 MG-UNIT tablet Take 1 tablet by mouth 2 (two) times daily.    . cetirizine (ZYRTEC) 10 MG tablet Take 10 mg by mouth daily as needed.     . famotidine (PEPCID) 40 MG tablet Take by mouth daily.    Marland Kitchen losartan (COZAAR) 100 MG tablet TAKE ONE TABLET BY MOUTH EVERY DAY 30 tablet 11  . Melatonin 3 MG TABS Take 10 mg by mouth daily.     . Misc Natural Products (TURMERIC CURCUMIN) CAPS Take 1 capsule by mouth daily. 1000MG  DAILY    . mometasone (NASONEX) 50 MCG/ACT nasal spray Place 2 sprays into the nose as needed. 17 g 3  . ondansetron (ZOFRAN-ODT) 4 MG disintegrating tablet DISSOLVE 1 TABLET IN MOUTH EVERY 12 HOURS AS NEEDED FOR NAUSEA OR VOMITING 30 tablet 0  . theophylline (UNIPHYL) 400  MG 24 hr tablet TAKE ONE-HALF TABLET BY MOUTH EVERY DAY 45 tablet 0  . tolterodine (DETROL LA) 4 MG 24 hr capsule Take 4 mg by mouth daily.    Marland Kitchen UNABLE TO FIND CBD po daily and CBD pain cream as needed    . sodium chloride (OCEAN) 0.65 % nasal spray 1 spray by Nasal route as needed.      . SYMBICORT 80-4.5 MCG/ACT inhaler INHALE 2 PUFFS INTO THE LUNGS 2 TIMES A DAY AS DIRECTED (Patient not taking: Reported on 10/17/2019) 10.2 g 11  . Wheat Dextrin (BENEFIBER DRINK MIX PO) Take 1 packet by mouth.     No current facility-administered medications for this visit.    Past Medical History:  Diagnosis Date  . Allergy   . Asthma   . Cancer ([place])    lung  . Colonic polyp   . GERD (gastroesophageal reflux disease)   . Hyperlipidemia   . Hypertension   . Osteoporosis    osteoarthritis  . Urinary incontinence   . Vertigo    chronic    Past Surgical History:  Procedure Laterality Date  . APPENDECTOMY    . BLADDER REPAIR    . CHOLECYSTECTOMY    . esophageal hernia  01/2007   repaired  x 4  . EYE SURGERY     cataract extraction, bilateral  . JOINT REPLACEMENT  02/2010   Left THR  . JOINT REPLACEMENT  5/12   Right THR--Dr Hooten  . VAGINAL HYSTERECTOMY      Family History  Problem Relation Age of Onset  . Stroke Father   . Pulmonary embolism Father   No bleeding disorders No aneurysms  Social History Social History        Tobacco Use  . Smoking status: Never Smoker  . Smokeless tobacco: Never Used  Substance Use Topics  . Alcohol use: No    Alcohol/week: 0.0 oz  . Drug use: No  Widowed       Allergies  Allergen Reactions  . Promethazine Hcl     Psychotic reaction----tolerates zofran  . Lactose Diarrhea  . Scopolamine     Rash to pills        REVIEW OF SYSTEMS(Negative unless checked)  Constitutional: [] ??Weight loss[] ??Fever[] ??Chills Cardiac:[] ??Chest pain[] ??Chest pressure[] ??Palpitations [] ??Shortness of breath  when laying flat [] ??Shortness of breath at rest [] ??Shortness of breath with exertion. Vascular: [] ??Pain in legs with walking[] ??Pain in legsat rest[] ??Pain in legs when laying flat [] ??Claudication [] ??Pain in feet when walking [] ??Pain in feet at rest [] ??Pain in feet when laying flat [] ??History of DVT [] ??Phlebitis [] ??Swelling in legs [] ??Varicose veins [] ??Non-healing ulcers Pulmonary: [] ??Uses home oxygen [] ??Productive cough[] ??Hemoptysis [] ??Wheeze [] ??COPD [] ??Asthma Neurologic: [x] ??Dizziness [] ??Blackouts [] ??Seizures [] ??History of stroke [] ??History of TIA[] ??Aphasia [] ??Temporary blindness[] ??Dysphagia [] ??Weaknessor numbness in arms [] ??Weakness or numbnessin legs Musculoskeletal: [x] ??Arthritis [] ??Joint swelling [x] ??Joint pain [] ??Low back pain Hematologic:[] ??Easy bruising[] ??Easy bleeding [] ??Hypercoagulable state [] ??Anemic [] ??Hepatitis Gastrointestinal:[] ??Blood in stool[] ??Vomiting blood[] ??Gastroesophageal reflux/heartburn[] ??Difficulty swallowing  Genitourinary: [] ??Chronic kidney disease [] ??Difficulturination [] ??Frequenturination [] ??Burning with urination[] ??Blood in urine Skin: [] ??Rashes [] ??Ulcers [] ??Wounds Psychological: [] ??History of anxiety[] ??History of major depression.    Physical Examination  Vitals:   10/17/19 1129  BP: (!) 189/76  Pulse: 67  Resp: 16  Weight: 110 lb 3.2 oz (50 kg)  Height: 4\' 10"  (1.473 m)   Body mass index is 23.03 kg/m. Gen:  WD/WN, NAD. Appears far younger than stated age. Head: /AT, No temporalis wasting. Ear/Nose/Throat: Hearing grossly intact, nares w/o erythema or drainage, trachea midline Eyes: Conjunctiva clear. Sclera non-icteric Neck: Supple.  Pulmonary:  Good air movement, equal and clear to auscultation bilaterally.  Cardiac: RRR, No JVD Vascular:  Vessel Right Left  Radial Palpable Palpable                     Musculoskeletal: M/S 5/5 throughout.  No deformity or atrophy.  Neurologic: CN 2-12 intact. Sensation grossly intact in extremities.  Symmetrical.  Speech is fluent. Motor exam as listed above. Psychiatric: Judgment intact, Mood & affect appropriate for pt's clinical situation. Dermatologic: No rashes or ulcers noted.  No cellulitis or open wounds.      CBC Lab Results  Component Value Date   WBC 11.4 (H) 03/22/2015   HGB 13.8 03/22/2015   HCT 42.3 03/22/2015   MCV 88.9 03/22/2015   PLT 294 03/22/2015    BMET    Component Value Date/Time   NA 136 03/22/2015 0700   K 3.7 03/22/2015 0700   CL 99 (L) 03/22/2015 0700   CO2 26 03/22/2015 0700   GLUCOSE 170 (H) 03/22/2015 0700   BUN 14 03/22/2015 0700   CREATININE 0.70 10/14/2019 1100   CALCIUM 10.1 03/22/2015 0700   GFRNONAA >60 03/22/2015 0700   GFRAA >60 03/22/2015 0700   Estimated Creatinine Clearance: 33.5 mL/min (by C-G formula based on  SCr of 0.7 mg/dL).  COAG No results found for: INR, PROTIME  Radiology MR BRAIN W WO CONTRAST  Result Date: 10/14/2019 CLINICAL DATA:  86 year old female with headaches, left neck and face pain. History of anterior cerebral artery fusiform ectasia/aneurysm. EXAM: MRI HEAD WITHOUT AND WITH CONTRAST TECHNIQUE: Multiplanar, multiecho pulse sequences of the brain and surrounding structures were obtained without and with intravenous contrast. CONTRAST:  63mL GADAVIST GADOBUTROL 1 MMOL/ML IV SOLN COMPARISON:  Intracranial MRA 09/28/2017 and earlier. Brain MRI 04/09/2018. FINDINGS: Brain: No restricted diffusion to suggest acute infarction. No midline shift, mass effect, evidence of mass lesion, ventriculomegaly, extra-axial collection or acute intracranial hemorrhage. Cervicomedullary junction and pituitary are within normal limits. Stable cerebral volume since 2019. Chronic patchy and widespread cerebral white matter T2 and FLAIR hyperintensity. Chronic involvement of the deep gray  nuclei and thalami. Moderate chronic involvement of the pons. There is evidence of a chronic microhemorrhage in the central pons on SWI imaging now (series 13, image 20). No other chronic cerebral blood products. No cortical encephalomalacia or new signal abnormality identified. No abnormal enhancement identified. No dural thickening. No abnormal enhancement identified. Vascular: Major intracranial vascular flow voids are stable. The major dural venous sinuses are enhancing and appear to be patent. 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 Background bone marrow signal is within normal limits. Sinuses/Orbits: Negative orbits. Stable paranasal sinuses, well pneumatized aside from a chronic right maxillary sinus retention cysts. Other: Mastoids remain clear. Visible internal auditory structures appear normal. Scalp and face soft tissues appear negative. IMPRESSION: 1. No acute intracranial abnormality. Stable appearance of chronic small vessel disease in the brain since 2019. 2. 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 3. Repeat MRA head without contrast would be necessary if re-evaluation of the right ACA A2 segment dilatation is desired. Electronically Signed   By: Genevie Ann M.D.   On: 10/14/2019 15:22     Assessment/Plan Essential hypertension, benign blood pressure control important in reducing the progression of atherosclerotic disease. On appropriate oral medications.   Hyperlipidemia lipid control important in reducing the progression of atherosclerotic disease. Continue statin therapy  Berry aneurysm She also went an MRI of her brain recently which the 2 mm berry aneurysm that was seen previously was not really identified.  Recheck in a year  Carotid stenosis Duplex of her carotids today  show stable, mild carotid artery stenosis in the 1 to 39% range bilaterally    Leotis Pain, MD  10/17/2019 12:29 PM    This note was created with Dragon medical transcription system.  Any errors from dictation are purely unintentional

## 2019-10-17 NOTE — Assessment & Plan Note (Signed)
Duplex of her carotids today show stable, mild carotid artery stenosis in the 1 to 39% range bilaterally

## 2019-11-28 ENCOUNTER — Other Ambulatory Visit: Payer: Self-pay | Admitting: *Deleted

## 2019-11-28 ENCOUNTER — Other Ambulatory Visit: Payer: Self-pay | Admitting: Orthopedic Surgery

## 2019-11-28 DIAGNOSIS — M25511 Pain in right shoulder: Secondary | ICD-10-CM

## 2019-12-11 ENCOUNTER — Other Ambulatory Visit: Payer: Self-pay

## 2019-12-11 ENCOUNTER — Ambulatory Visit
Admission: RE | Admit: 2019-12-11 | Discharge: 2019-12-11 | Disposition: A | Discharge status: Home / Self Care | Payer: Medicare Other | Source: Ambulatory Visit | Attending: Orthopedic Surgery | Admitting: Orthopedic Surgery

## 2019-12-11 DIAGNOSIS — M25511 Pain in right shoulder: Secondary | ICD-10-CM | POA: Diagnosis not present

## 2020-04-13 ENCOUNTER — Ambulatory Visit: Payer: BLUE CROSS/BLUE SHIELD | Admitting: Dermatology

## 2020-06-09 ENCOUNTER — Telehealth (INDEPENDENT_AMBULATORY_CARE_PROVIDER_SITE_OTHER): Payer: Self-pay | Admitting: Vascular Surgery

## 2020-06-09 ENCOUNTER — Other Ambulatory Visit (INDEPENDENT_AMBULATORY_CARE_PROVIDER_SITE_OTHER): Payer: Self-pay | Admitting: Nurse Practitioner

## 2020-06-09 DIAGNOSIS — R252 Cramp and spasm: Secondary | ICD-10-CM

## 2020-06-09 NOTE — Telephone Encounter (Signed)
The issues with her arm grip strength is likely not related to circulation.  Her legs possibly could be however I would suggest talking with her PCP for work up of all of her issues.  Otherwise se can see her with ABIs

## 2020-06-09 NOTE — Telephone Encounter (Signed)
Patient called stating that left leg is cramping from leg going all of the way up to her hip. She's also experiencing tightening of muscles with the left arm (grips strength is weakening). She drinks tonic water and it usually helps with the cramping, water isnt working any longer. Patient states that the numbing and tightening of her limbs may be circulatory and she would like to come in to be seen. Patient was last seen 11-02-19 with carotid studies (JD). Please advise.

## 2020-06-09 NOTE — Telephone Encounter (Signed)
Patient was made aware with medical advice and will be schedule to come in with ABIs

## 2020-06-16 ENCOUNTER — Ambulatory Visit (INDEPENDENT_AMBULATORY_CARE_PROVIDER_SITE_OTHER): Payer: Medicare Other

## 2020-06-16 ENCOUNTER — Encounter (INDEPENDENT_AMBULATORY_CARE_PROVIDER_SITE_OTHER): Payer: Self-pay | Admitting: Nurse Practitioner

## 2020-06-16 ENCOUNTER — Other Ambulatory Visit: Payer: Self-pay

## 2020-06-16 ENCOUNTER — Ambulatory Visit (INDEPENDENT_AMBULATORY_CARE_PROVIDER_SITE_OTHER): Payer: Medicare Other | Admitting: Nurse Practitioner

## 2020-06-16 VITALS — BP 186/77 | HR 66 | Resp 15 | Wt 104.8 lb

## 2020-06-16 DIAGNOSIS — I6523 Occlusion and stenosis of bilateral carotid arteries: Secondary | ICD-10-CM | POA: Diagnosis not present

## 2020-06-16 DIAGNOSIS — I10 Essential (primary) hypertension: Secondary | ICD-10-CM | POA: Diagnosis not present

## 2020-06-16 DIAGNOSIS — R252 Cramp and spasm: Secondary | ICD-10-CM

## 2020-06-16 DIAGNOSIS — E785 Hyperlipidemia, unspecified: Secondary | ICD-10-CM

## 2020-06-17 ENCOUNTER — Encounter (INDEPENDENT_AMBULATORY_CARE_PROVIDER_SITE_OTHER): Payer: Self-pay | Admitting: Nurse Practitioner

## 2020-06-17 NOTE — Progress Notes (Signed)
Subjective:    Patient ID: Jennifer Roman, female    DOB: 1928/03/19, 86 y.o.   MRN: [id number] Chief Complaint  Patient presents with  . Follow-up    The patient presents today with concern for cramping in her hands and her legs.  She notes that this cramping is not consistent or predictable.  She notes that sometimes it happens at night sometimes happens after swimming, it never is claudication-like.  The pain also occurs sporadically in her hands as well.  She denies any fever, chills, nausea, vomiting or diarrhea.  The patient notes that tonic water used to control her cramps however now she notices it is less effective.  She was concerned that this may be related to a vascular cause.  Today noninvasive studies show an ABI of 1.12 on the right and 1.15 on the left the patient has triphasic tibial artery waveforms bilaterally with good toe waveforms bilaterally.         Review of Systems  Cardiovascular:       Cramping in upper and lower extremities  All other systems reviewed and are negative.      Objective:   Physical Exam Vitals reviewed.  HENT:     Head: Normocephalic.  Cardiovascular:     Rate and Rhythm: Normal rate and regular rhythm.     Pulses: Normal pulses.  Pulmonary:     Effort: Pulmonary effort is normal.  Musculoskeletal:     Right lower leg: No edema.     Left lower leg: Edema present.  Skin:    General: Skin is warm and dry.  Neurological:     Mental Status: She is alert and oriented to person, place, and time.  Psychiatric:        Mood and Affect: Mood normal.        Behavior: Behavior normal.        Thought Content: Thought content normal.        Judgment: Judgment normal.     BP (!) 186/77 (BP Location: Right Arm)   Pulse 66   Resp 15   Wt 104 lb 12.8 oz (47.5 kg)   BMI 21.90 kg/m   Past Medical History:  Diagnosis Date  . Allergy   . Asthma   . Cancer ([place])    lung  . Colonic polyp   . GERD (gastroesophageal reflux disease)   .  Hyperlipidemia   . Hypertension   . Osteoporosis    osteoarthritis  . Urinary incontinence   . Vertigo    chronic    Social History   Socioeconomic History  . Marital status: Widowed    Spouse name: Not on file  . Number of children: 4  . Years of education: Not on file  . Highest education level: Not on file  Occupational History  . Occupation: Retired, Barista: RETIRED  Tobacco Use  . Smoking status: Never Smoker  . Smokeless tobacco: Never Used  Substance and Sexual Activity  . Alcohol use: No    Alcohol/week: 0.0 standard drinks  . Drug use: No  . Sexual activity: Not on file  Other Topics Concern  . Not on file  Social History Narrative   Has living will   Son [name] is health care POA.   Has DNR already.   No feeding tube if cognitively unaware         Social Determinants of Health   Financial Resource Strain:   . Difficulty  of Paying Living Expenses: Not on file  Food Insecurity:   . Worried About Charity fundraiser in the Last Year: Not on file  . Ran Out of Food in the Last Year: Not on file  Transportation Needs:   . Lack of Transportation (Medical): Not on file  . Lack of Transportation (Non-Medical): Not on file  Physical Activity:   . Days of Exercise per Week: Not on file  . Minutes of Exercise per Session: Not on file  Stress:   . Feeling of Stress : Not on file  Social Connections:   . Frequency of Communication with Friends and Family: Not on file  . Frequency of Social Gatherings with Friends and Family: Not on file  . Attends Religious Services: Not on file  . Active Member of Clubs or Organizations: Not on file  . Attends Archivist Meetings: Not on file  . Marital Status: Not on file  Intimate Partner Violence:   . Fear of Current or Ex-Partner: Not on file  . Emotionally Abused: Not on file  . Physically Abused: Not on file  . Sexually Abused: Not on file    Past Surgical History:    Procedure Laterality Date  . APPENDECTOMY    . BLADDER REPAIR    . CHOLECYSTECTOMY    . esophageal hernia  01/2007   repaired x 4  . EYE SURGERY     cataract extraction, bilateral  . JOINT REPLACEMENT  02/2010   Left THR  . JOINT REPLACEMENT  5/12   Right THR--Dr Hooten  . VAGINAL HYSTERECTOMY      Family History  Problem Relation Age of Onset  . Stroke Father   . Pulmonary embolism Father     Allergies  Allergen Reactions  . Promethazine Hcl     Psychotic reaction----tolerates zofran  . Lactose Diarrhea  . Scopolamine     Rash to pills       Assessment & Plan:   1. Leg cramps Recommend:  I do not find evidence of Vascular pathology that would explain the patient's symptoms  The patient has atypical pain symptoms for vascular disease  I do not find evidence of Vascular pathology that would explain the patient's symptoms and I suspect the patient is c/o pseudoclaudication.  Patient should have an evaluation of his LS spine which I defer to the primary service.  Noninvasive studies  do not identify vascular problems  The patient should continue walking and begin a more formal exercise program. The patient should continue his antiplatelet therapy and aggressive treatment of the lipid abnormalities. The patient should begin wearing graduated compression socks 15-20 mmHg strength to control her mild edema.  Patient will follow-up with me on a PRN basis  Further work-up of her lower extremity pain is deferred to the primary service     2. Essential hypertension, benign Continue antihypertensive medications as already ordered, these medications have been reviewed and there are no changes at this time.   3. Bilateral carotid artery stenosis Currently no signs symptoms of worsening patient has an upcoming appointment in February.  We will continue with antiplatelet therapy as well as treatment of her hypertension hyperlipidemia.  4. Hyperlipidemia, unspecified  hyperlipidemia type Continue statin as ordered and reviewed, no changes at this time    Current Outpatient Medications on File Prior to Visit  Medication Sig Dispense Refill  . acetaminophen (TYLENOL) 500 MG tablet Take 500 mg by mouth as needed.     Marland Kitchen  albuterol (PROVENTIL,VENTOLIN) 90 MCG/ACT inhaler Inhale 2 puffs into the lungs every 6 (six) hours as needed. 17 g 1  . aspirin EC 81 MG tablet Take 81 mg by mouth daily.    Marland Kitchen atorvastatin (LIPITOR) 10 MG tablet Take 10 mg by mouth daily.    Marland Kitchen BIOTIN PO Take by mouth daily.     . budesonide-formoterol (SYMBICORT) 80-4.5 MCG/ACT inhaler Inhale 2 puffs into the lungs as needed.     . calcium citrate-vitamin D (CITRACAL+D) 315-200 MG-UNIT tablet Take 1 tablet by mouth 2 (two) times daily.    . cetirizine (ZYRTEC) 10 MG tablet Take 10 mg by mouth daily as needed.     . famotidine (PEPCID) 40 MG tablet Take by mouth daily.    Marland Kitchen losartan (COZAAR) 100 MG tablet TAKE ONE TABLET BY MOUTH EVERY DAY 30 tablet 11  . Melatonin 3 MG TABS Take 10 mg by mouth daily.     . Misc Natural Products (TURMERIC CURCUMIN) CAPS Take 1 capsule by mouth daily. 1000MG  DAILY    . mometasone (NASONEX) 50 MCG/ACT nasal spray Place 2 sprays into the nose as needed. 17 g 3  . ondansetron (ZOFRAN-ODT) 4 MG disintegrating tablet DISSOLVE 1 TABLET IN MOUTH EVERY 12 HOURS AS NEEDED FOR NAUSEA OR VOMITING 30 tablet 0  . POTASSIUM GLUCONATE PO Take by mouth.    . sodium chloride (OCEAN) 0.65 % nasal spray 1 spray by Nasal route as needed.      . theophylline (UNIPHYL) 400 MG 24 hr tablet TAKE ONE-HALF TABLET BY MOUTH EVERY DAY 45 tablet 0  . tolterodine (DETROL LA) 4 MG 24 hr capsule Take 4 mg by mouth daily.    Marland Kitchen UNABLE TO FIND CBD po daily and CBD pain cream as needed    . Wheat Dextrin (BENEFIBER DRINK MIX PO) Take 1 packet by mouth.    . SYMBICORT 80-4.5 MCG/ACT inhaler INHALE 2 PUFFS INTO THE LUNGS 2 TIMES A DAY AS DIRECTED (Patient not taking: Reported on 10/17/2019) 10.2 g  11   No current facility-administered medications on file prior to visit.    There are no Patient Instructions on file for this visit. No follow-ups on file.   Kris Hartmann, NP

## 2020-09-27 ENCOUNTER — Telehealth: Payer: Self-pay | Admitting: *Deleted

## 2020-09-27 NOTE — Telephone Encounter (Signed)
Jennifer Roman called to set up an appointment with Dr. Denman George to reassess her plan of care and options for possible surgery. Appt.scheduled and she is aware of the date and time.

## 2020-10-04 ENCOUNTER — Encounter: Payer: Self-pay | Admitting: Gynecologic Oncology

## 2020-10-05 ENCOUNTER — Encounter: Payer: Self-pay | Admitting: Gynecologic Oncology

## 2020-10-05 ENCOUNTER — Inpatient Hospital Stay (HOSPITAL_BASED_OUTPATIENT_CLINIC_OR_DEPARTMENT_OTHER): Payer: Medicare Other | Admitting: Gynecologic Oncology

## 2020-10-05 ENCOUNTER — Other Ambulatory Visit: Payer: Self-pay

## 2020-10-05 ENCOUNTER — Inpatient Hospital Stay: Payer: Medicare Other | Attending: Gynecologic Oncology

## 2020-10-05 VITALS — BP 144/67 | HR 81 | Temp 97.0°F | Resp 18 | Wt 100.6 lb

## 2020-10-05 DIAGNOSIS — D3911 Neoplasm of uncertain behavior of right ovary: Secondary | ICD-10-CM | POA: Insufficient documentation

## 2020-10-05 DIAGNOSIS — N838 Other noninflammatory disorders of ovary, fallopian tube and broad ligament: Secondary | ICD-10-CM

## 2020-10-05 LAB — BASIC METABOLIC PANEL - CANCER CENTER ONLY
Anion gap: 11 (ref 5–15)
BUN: 19 mg/dL (ref 8–23)
CO2: 28 mmol/L (ref 22–32)
Calcium: 9.6 mg/dL (ref 8.9–10.3)
Chloride: 103 mmol/L (ref 98–111)
Creatinine: 1.1 mg/dL — ABNORMAL HIGH (ref 0.44–1.00)
GFR, Estimated: 48 mL/min — ABNORMAL LOW (ref >60)
Glucose, Bld: 117 mg/dL — ABNORMAL HIGH (ref 70–99)
Potassium: 4.1 mmol/L (ref 3.5–5.1)
Sodium: 142 mmol/L (ref 135–145)

## 2020-10-05 NOTE — Progress Notes (Signed)
Follow-up Note: Gyn-Onc  Consult was initially requested by Dr. Corinna Capra in 2016 for the evaluation of Jennifer Roman 87 y.o. female  CC:  Chief Complaint  Patient presents with  . Ovarian mass, right    Assessment/Plan:  Jennifer. Jennifer Roman  is a 87 y.o.  87 y.o.  year old with a small solid right ovarian mass (3 cm). It had been stable for 4 years of monitoring. She has slightly progressive weight loss and intermittent right sided discomfort (does not require medication).  She wants to be cautious to evaluate the mass.  I explained to Jennifer Roman that this ovarian mass must not be malignant given that it had not resulted in carcinomatosis over a 6 year period of follow-up. We will reorder a CT scan to document stability, however, presuming this shows a stable mass, the natural history of this finding has proven it to be benign.  Jennifer. Roman is not a candidate for an elective surgery for resection of the ovary to attempt in palliation of symptoms (which I suspect and not secondary to this 3 cm solid ovarian mass anyway).  Any such surgery would carry with it substantial risk (see my prior notes regarding her surgical history and risks for future surgery).  I worry more about quality of life deterioration and even potential death from surgery far outweigh any potential benefit in symptom control.  Therefore in essence Jennifer. Coplen is not a surgical candidate in my opinion.  She will follow up with me in the future should on a as needed basis should she have any future questions or concerns, however scheduled follow-up is not required for this mass and surveillance is not recommended.  Jennifer Roman will see me back after seeing her urologist to discuss management going forwards.   HPI: Jennifer Roman is an 87 year old para swollen woman (G4 P4) who seen in consultation at the request of Dr. Corinna Capra for right-sided ovarian mass. The patient had an incidentally found ovarian mass on CT scan performed on 03/22/2015 as part of  workup for epigastric pain. At this time the mass that was described was a complex 3.7 x 3.1 x 2.9 cm mass arising from the right ovary. On that CT scan there were no signs concerning for metastatic ovarian cancer. A CA-125 was drawn at that time and was mildly elevated at 33 units per milliliter (upper limit of normal 38 units per milliliter). Of interest her CA-19-9 was mildly elevated at 42.  She was told that she had ovarian cancer however the patient did not want intervention at that time and therefore declined further follow-up, workup, treatment.  In the intervening time. She has had progressive epigastric discomfort. It is a little unclear if Y that she found her way to Dr. Gregor Hams office for further evaluation of this right adnexal mass in December 2016. At that time a transvaginal ultrasound was performed. It reveals surgically absent uterus. No left adnexal masses were seen. The right adnexa measured 5.2 x 4.1 x 5.3 cm with a complex mass appearing mostly solid with some cystic areas, and a small amount of blood flow seen within the mass does not sleep free fluid seen. CA-125 was not redraw on.  The patient is asymptomatic from this mass that may occasionally have slight menstrual cramp-like sensations.  Of note she has a very significant surgical history. Her surgeries include a vaginal hysterectomy for benign indications. An open appendectomy, and open cholecystectomy, multiple suspensions of her bladder but unlikely were done vaginally. She  is also had a diaphragmatic hernia repair approximately 10-15 years ago that was performed laparoscopically at [facility] and was complicated requiring at least 2 reoperations laparoscopically to correct the issue. Since that time she's had persistent epigastric pain and difficulty digesting food. She is persistent discomfort from this.  She is otherwise fairly healthy with some asthma, and hypertension. She has vertigo. She has a remote history of stage I lung  cancer treated with surgery.  She lives alone in assisted living facility. She has no help at home. She is reactive with a good quality of life and strongly desires to maintain this. The patient has an active living will which states 3 things specifically #1 she does not desire any surgery, #2 she does not desire any major intervention such as chemotherapy or radiation, #3 she desires quality of life considerations over prolongation of life.  She decided on expectant management rather than surgical intervention. On 09/01/16 CA 125 was drawn and was 22. On 11/28/13 she underwent transvaginal US to re-evaluate the cyst. "It showed a stable cyst: 2.3 x 2.1 x 2.1 cm complex cystic mass with thick septations and nodularity worrisome for right ovarian neoplasm. Left ovary not visualized." Her CA 125 on 12/02/15 was 22.6.  It was felt that the cyst was stable, benign and asymptomatic and therefore surgery was not performed as the risk was felt to outweigh the benefits.   Over the subsequent 2+ years she developed slight increase in the intensity of the right lower joint pain.  He describes the pain as being a Grapey colic-like pain that starts in the right lower quadrant and wraps around to the right flank.  She describes it as being exactly like what kidney stone pain had felt for her many years ago when she had a kidney stone. Sometimes the bouts last 20 hours and are severe.   She underwent a CT scan of the abdomen and pelvis to work this pain up on May 15, 2018.  This revealed a nonobstructing stone in the inferior collecting system of the right kidney measuring 6 mm that was new from her prior scan.  There was borderline dilated small bowel throughout the length of the intestines.  There is relative decompression of the colon.  Transition of the small bowel occurred in the pelvis immediately adjacent to the right ovarian cyst.  Atherosclerosis was present.  In the pelvis a right-sided 3.6 cm ovarian  cyst was seen (it had increased 29mm from previous study). No ascites, carcinomatosis or lymphadenopathy was seen.     Interval Hx: The patient returned to see me after approximately 2 years since her last visit for re-evaluation of the right ovarian mass. She had begun to notice slow weight loss and mild neurologic symptoms over the preceding year. She was concerned that the weight loss could be from ovarian cancer and wanted to make sure that she didn't miss the opportunity to do something about the ovarian mass should that be necessary.   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    Current Meds:  Outpatient Encounter Medications as of 10/05/2020  Medication Sig  . acetaminophen (TYLENOL) 500 MG tablet Take 500 mg by mouth as needed.  . Alpha-Lipoic Acid 300 MG TABS Take 1 tablet by mouth in the morning and at bedtime.  Marland Kitchen aspirin EC 81 MG tablet Take 81 mg by mouth daily.  Marland Kitchen atorvastatin (LIPITOR) 10 MG tablet Take 10 mg by mouth daily with  supper.  . cetirizine (ZYRTEC) 10 MG tablet Take 10 mg by mouth daily as needed.   . famotidine (PEPCID) 40 MG tablet Take 40 mg by mouth daily.  Marland Kitchen losartan (COZAAR) 100 MG tablet TAKE ONE TABLET BY MOUTH EVERY DAY  . Multiple Vitamins-Minerals (MULTIVITAMIN WITH MINERALS) tablet Take 1 tablet by mouth daily.  . ondansetron (ZOFRAN-ODT) 4 MG disintegrating tablet DISSOLVE 1 TABLET IN MOUTH EVERY 12 HOURS AS NEEDED FOR NAUSEA OR VOMITING  . OVER THE COUNTER MEDICATION 1 CBD Gummi  at hs  . OVER THE COUNTER MEDICATION CBD Cream  and roll on topically as needed  . POTASSIUM GLUCONATE PO Take 595 mg by mouth daily with supper.  . THEANINE PO Take by mouth. Tincture  1/3 dropper under the tongue for 30 seconds at mid day  . theophylline (UNIPHYL) 400 MG 24 hr tablet TAKE ONE-HALF TABLET BY MOUTH EVERY DAY  . tolterodine (DETROL LA) 4 MG 24 hr capsule Take 4 mg by mouth daily after supper.  . Turmeric (QC TUMERIC COMPLEX  PO) Take 1,000 mg by mouth in the morning and at bedtime.  Marland Kitchen UNABLE TO FIND CBD 2000 mg      1/2 dropper po  tid daily  . albuterol (PROVENTIL,VENTOLIN) 90 MCG/ACT inhaler Inhale 2 puffs into the lungs every 6 (six) hours as needed. (Patient not taking: Reported on 10/04/2020)  . budesonide-formoterol (SYMBICORT) 80-4.5 MCG/ACT inhaler Inhale 2 puffs into the lungs as needed.  (Patient not taking: Reported on 10/04/2020)  . mometasone (NASONEX) 50 MCG/ACT nasal spray Place 2 sprays into the nose as needed. (Patient not taking: Reported on 10/04/2020)  . SYMBICORT 80-4.5 MCG/ACT inhaler INHALE 2 PUFFS INTO THE LUNGS 2 TIMES A DAY AS DIRECTED (Patient not taking: No sig reported)  . [DISCONTINUED] BIOTIN PO Take by mouth daily.   . [DISCONTINUED] calcium citrate-vitamin D (CITRACAL+D) 315-200 MG-UNIT tablet Take 1 tablet by mouth 2 (two) times daily.  . [DISCONTINUED] famotidine (PEPCID) 40 MG tablet Take by mouth daily.  . [DISCONTINUED] Melatonin 3 MG TABS Take 10 mg by mouth daily.   . [DISCONTINUED] Misc Natural Products (TURMERIC CURCUMIN) CAPS Take 1 capsule by mouth daily. 1000MG  DAILY  . [DISCONTINUED] sodium chloride (OCEAN) 0.65 % nasal spray 1 spray by Nasal route as needed.    . [DISCONTINUED] Wheat Dextrin (BENEFIBER DRINK MIX PO) Take 1 packet by mouth.   No facility-administered encounter medications on file as of 10/05/2020.    Allergy:  Allergies  Allergen Reactions  . Promethazine Hcl     Psychotic reaction----tolerates zofran  . Lactose Diarrhea  . Scopolamine     Rash to pills    Social Hx:   Social History   Socioeconomic History  . Marital status: Widowed    Spouse name: Not on file  . Number of children: 4  . Years of education: Not on file  . Highest education level: Not on file  Occupational History  . Occupation: Retired, Barista: RETIRED  Tobacco Use  . Smoking status: Never Smoker  . Smokeless tobacco: Never Used  Substance  and Sexual Activity  . Alcohol use: No    Alcohol/week: 0.0 standard drinks  . Drug use: No  . Sexual activity: Not on file  Other Topics Concern  . Not on file  Social History Narrative   Has living will   Son [name] is health care POA.   Has DNR already.   No feeding tube if  cognitively unaware         Social Determinants of Health   Financial Resource Strain: Not on file  Food Insecurity: Not on file  Transportation Needs: Not on file  Physical Activity: Not on file  Stress: Not on file  Social Connections: Not on file  Intimate Partner Violence: Not on file    Past Surgical Hx:  Past Surgical History:  Procedure Laterality Date  . APPENDECTOMY    . BLADDER REPAIR    . CHOLECYSTECTOMY    . esophageal hernia  01/2007   repaired x 4  . EYE SURGERY     cataract extraction, bilateral  . JOINT REPLACEMENT  02/2010   Left THR  . JOINT REPLACEMENT  5/12   Right THR--Dr Hooten  . VAGINAL HYSTERECTOMY      Past Medical Hx:  Past Medical History:  Diagnosis Date  . Allergy   . Asthma   . Cancer ([place])    lung  . Colonic polyp   . GERD (gastroesophageal reflux disease)   . Hyperlipidemia   . Hypertension   . Osteoporosis    osteoarthritis  . Urinary incontinence   . Vertigo    chronic    Past Gynecological History:  SVD x 4  No LMP recorded. Patient has had a hysterectomy.  Family Hx:  Family History  Problem Relation Age of Onset  . Stroke Father   . Pulmonary embolism Father     Review of Systems:  Constitutional  Feels well,    ENT Normal appearing ears and nares bilaterally Skin/Breast  No rash, sores, jaundice, itching, dryness Cardiovascular  No chest pain, shortness of breath, or edema  Pulmonary  No cough or wheeze.  Gastro Intestinal  No nausea, vomitting, or diarrhoea. No bright red blood per rectum,+ abdominal pain, change in bowel movement, or constipation.  Genito Urinary  No frequency, urgency, dysuria, see HPI Musculo  Skeletal  No myalgia, arthralgia, joint swelling or pain  Neurologic  No weakness, numbness, change in gait,  Psychology  No depression, anxiety, insomnia.   Vitals:  Blood pressure (!) 144/67, pulse 81, temperature (!) 97 F (36.1 C), temperature source Tympanic, resp. rate 18, weight 100 lb 9.6 oz (45.6 kg), SpO2 100 %.  Physical Exam: WD in NAD Resp: in no pulm distress Extremities: no edema. Pelvic: Normal external genitalia, grossly normal appearing vagina, bimanual examination reveals some tenderness in the right lower quadrant but no palpable masses.  The uterus is surgically absent as is the cervix. Extremities: no edema.   30 minutes of direct face to face counseling was spent with the patient regarding her diagnosis, prognosis, and treatment decision making.    Thereasa Solo, MD  10/05/2020, 3:43 PM

## 2020-10-05 NOTE — Patient Instructions (Signed)
Dr Denman George has ordered a CT scan to evaluate the right ovary. It will also show the kidneys as well. Provided the right ovary is stable in size and characteristics, she is not recommending surgery to remove this ovary as it is not cancerous and surgery would likely cause great harm to your body (and a difficult recovery).  Her office can be reached at [phone].  Dr Serita Grit office will directly communicate the results of the CT scan with you.

## 2020-10-06 ENCOUNTER — Telehealth: Payer: Self-pay

## 2020-10-06 NOTE — Telephone Encounter (Signed)
Told Ms Sassi that her kidney function is slightly elevated on her b-met yesterday.  Told her that she needs to stay hydrated.  She needs to take in at least 64 oz of caffeine free fluid a day.  Told her that a copy of her b-met was sent to her PCP Dr. Maryland Pink. Pt verbalized understanding.

## 2020-10-11 ENCOUNTER — Telehealth: Payer: Self-pay

## 2020-10-11 NOTE — Telephone Encounter (Signed)
Pt wanted to know if the CT Scan of the A/P included the kidneys since she was having som pain on the left side of the abdomen.  Told her that it will evaluate the status of her kidneys. Pt verbalized understanding.

## 2020-10-13 ENCOUNTER — Ambulatory Visit
Admission: RE | Admit: 2020-10-13 | Discharge: 2020-10-13 | Disposition: A | Discharge status: Home / Self Care | Payer: Medicare Other | Source: Ambulatory Visit | Attending: Gynecologic Oncology | Admitting: Gynecologic Oncology

## 2020-10-13 ENCOUNTER — Other Ambulatory Visit: Payer: Self-pay

## 2020-10-13 DIAGNOSIS — N838 Other noninflammatory disorders of ovary, fallopian tube and broad ligament: Secondary | ICD-10-CM | POA: Insufficient documentation

## 2020-10-13 MED ORDER — IOHEXOL 300 MG/ML  SOLN
80.0000 mL | Freq: Once | INTRAMUSCULAR | Status: AC | PRN
  Administered 2020-10-13: 80 mL via INTRAVENOUS

## 2020-10-14 ENCOUNTER — Telehealth: Payer: Self-pay | Admitting: *Deleted

## 2020-10-14 NOTE — Telephone Encounter (Signed)
Told Jennifer Roman that Jennifer John, NP reviewed the CT report. The cyst is stable. There is no acute process noted. The kidneys look fine.  The lab work with serum creatine helps to determine the functioning of the kidneys.  She has increased her fluid intake daily knowing that the creatine was slightly elevated the other day. Her abdominal pain ,especially on the left side, is dissipating. She has been drinking fluids since she called earlier this am.  Told her that the two bottles of contrast probably created  the abdominal discomfort. Pt agreed.   She is grateful that the cyst is stable and will keep on living as she has been. She is appreciative of Dr. Serita Grit care.

## 2020-10-14 NOTE — Telephone Encounter (Signed)
Patient called and wanted to know her CT scan results were. Patient stated "The pain now feels like contractions. The pain started after the scan. My abdomen is distended and hard. Tylenol is not helping." Explained that someone will call her back soon

## 2020-10-15 ENCOUNTER — Ambulatory Visit (INDEPENDENT_AMBULATORY_CARE_PROVIDER_SITE_OTHER): Payer: Medicare Other | Admitting: Vascular Surgery

## 2020-10-15 ENCOUNTER — Ambulatory Visit (INDEPENDENT_AMBULATORY_CARE_PROVIDER_SITE_OTHER): Payer: Medicare Other

## 2020-10-15 ENCOUNTER — Other Ambulatory Visit: Payer: Self-pay

## 2020-10-15 ENCOUNTER — Encounter (INDEPENDENT_AMBULATORY_CARE_PROVIDER_SITE_OTHER): Payer: Self-pay | Admitting: Vascular Surgery

## 2020-10-15 VITALS — BP 177/72 | HR 73 | Ht 59.0 in | Wt 101.0 lb

## 2020-10-15 DIAGNOSIS — E785 Hyperlipidemia, unspecified: Secondary | ICD-10-CM | POA: Diagnosis not present

## 2020-10-15 DIAGNOSIS — I6523 Occlusion and stenosis of bilateral carotid arteries: Secondary | ICD-10-CM

## 2020-10-15 DIAGNOSIS — I10 Essential (primary) hypertension: Secondary | ICD-10-CM

## 2020-10-15 DIAGNOSIS — I671 Cerebral aneurysm, nonruptured: Secondary | ICD-10-CM | POA: Diagnosis not present

## 2020-10-15 NOTE — Assessment & Plan Note (Signed)
Her carotid disease was found to be stable on duplex today with 1 to 39% carotid artery stenosis bilaterally without significant progression from previous studies no role for intervention.  Continue current medications including aspirin and statin agent.  Recheck in 1 year.

## 2020-10-15 NOTE — Progress Notes (Signed)
MRN : [id number]  Jennifer Roman is a 87 y.o. (12/09/1927) female who presents with chief complaint of  Chief Complaint  Patient presents with  . Follow-up  . Carotid    1 year U/S  .  History of Present Illness: Patient returns today in follow-up of her carotid disease.  She has been having multiple neurologic issues and is now seeing a neurologist.  She had to stop swimming and was having multiple falls.  This has been improved after some change in her medication regimen.  She brings in her medicines today and we reviewed these.  Her carotid disease was found to be stable on duplex today with 1 to 39% carotid artery stenosis bilaterally without significant progression from previous studies  Current Outpatient Medications  Medication Sig Dispense Refill  . acetaminophen (TYLENOL) 500 MG tablet Take 500 mg by mouth as needed.    Marland Kitchen albuterol (PROVENTIL,VENTOLIN) 90 MCG/ACT inhaler Inhale 2 puffs into the lungs every 6 (six) hours as needed. 17 g 1  . Alpha-Lipoic Acid 300 MG TABS Take 1 tablet by mouth in the morning and at bedtime.    Marland Kitchen aspirin EC 81 MG tablet Take 81 mg by mouth daily.    Marland Kitchen atorvastatin (LIPITOR) 10 MG tablet Take 10 mg by mouth daily with supper.    . budesonide-formoterol (SYMBICORT) 80-4.5 MCG/ACT inhaler Inhale 2 puffs into the lungs as needed.    . cetirizine (ZYRTEC) 10 MG tablet Take 10 mg by mouth daily as needed.     . famotidine (PEPCID) 40 MG tablet Take 40 mg by mouth daily.    Marland Kitchen losartan (COZAAR) 100 MG tablet TAKE ONE TABLET BY MOUTH EVERY DAY 30 tablet 11  . mometasone (NASONEX) 50 MCG/ACT nasal spray Place 2 sprays into the nose as needed. 17 g 3  . Multiple Vitamins-Minerals (MULTIVITAMIN WITH MINERALS) tablet Take 1 tablet by mouth daily.    . ondansetron (ZOFRAN-ODT) 4 MG disintegrating tablet DISSOLVE 1 TABLET IN MOUTH EVERY 12 HOURS AS NEEDED FOR NAUSEA OR VOMITING 30 tablet 0  . OVER THE COUNTER MEDICATION 1 CBD Gummi  at hs    . OVER THE  COUNTER MEDICATION CBD Cream  and roll on topically as needed    . POTASSIUM GLUCONATE PO Take 595 mg by mouth daily with supper.    . SYMBICORT 80-4.5 MCG/ACT inhaler INHALE 2 PUFFS INTO THE LUNGS 2 TIMES A DAY AS DIRECTED 10.2 g 11  . THEANINE PO Take by mouth. Tincture  1/3 dropper under the tongue for 30 seconds at mid day    . theophylline (UNIPHYL) 400 MG 24 hr tablet TAKE ONE-HALF TABLET BY MOUTH EVERY DAY 45 tablet 0  . tolterodine (DETROL LA) 4 MG 24 hr capsule Take 4 mg by mouth daily after supper.    . Turmeric (QC TUMERIC COMPLEX PO) Take 1,000 mg by mouth in the morning and at bedtime.    Marland Kitchen UNABLE TO FIND CBD 2000 mg      1/2 dropper po  tid daily     No current facility-administered medications for this visit.    Past Medical History:  Diagnosis Date  . Allergy   . Asthma   . Cancer ([place])    lung  . Colonic polyp   . GERD (gastroesophageal reflux disease)   . Hyperlipidemia   . Hypertension   . Osteoporosis    osteoarthritis  . Urinary incontinence   . Vertigo    chronic  Past Surgical History:  Procedure Laterality Date  . APPENDECTOMY    . BLADDER REPAIR    . CHOLECYSTECTOMY    . esophageal hernia  01/2007   repaired x 4  . EYE SURGERY     cataract extraction, bilateral  . JOINT REPLACEMENT  02/2010   Left THR  . JOINT REPLACEMENT  5/12   Right THR--Dr Hooten  . VAGINAL HYSTERECTOMY      Family History  Problem Relation Age of Onset  . Stroke Father   . Pulmonary embolism Father   No bleeding disorders No aneurysms  Social History Social History        Tobacco Use  . Smoking status: Never Smoker  . Smokeless tobacco: Never Used  Substance Use Topics  . Alcohol use: No    Alcohol/week: 0.0 oz  . Drug use: No  Widowed       Allergies  Allergen Reactions  . Promethazine Hcl     Psychotic reaction----tolerates zofran  . Lactose Diarrhea  . Scopolamine     Rash to pills        REVIEW OF  SYSTEMS(Negative unless checked)  Constitutional: [] ???Weight loss[] ???Fever[] ???Chills Cardiac:[] ???Chest pain[] ???Chest pressure[] ???Palpitations [] ???Shortness of breath when laying flat [] ???Shortness of breath at rest [] ???Shortness of breath with exertion. Vascular: [] ???Pain in legs with walking[] ???Pain in legsat rest[] ???Pain in legs when laying flat [] ???Claudication [] ???Pain in feet when walking [] ???Pain in feet at rest [] ???Pain in feet when laying flat [] ???History of DVT [] ???Phlebitis [] ???Swelling in legs [] ???Varicose veins [] ???Non-healing ulcers Pulmonary: [] ???Uses home oxygen [] ???Productive cough[] ???Hemoptysis [] ???Wheeze [] ???COPD [] ???Asthma Neurologic: [x] ???Dizziness [] ???Blackouts [] ???Seizures [] ???History of stroke [] ???History of TIA[] ???Aphasia [] ???Temporary blindness[] ???Dysphagia [] ???Weaknessor numbness in arms [] ???Weakness or numbnessin legs Musculoskeletal: [x] ???Arthritis [] ???Joint swelling [x] ???Joint pain [] ???Low back pain Hematologic:[] ???Easy bruising[] ???Easy bleeding [] ???Hypercoagulable state [] ???Anemic [] ???Hepatitis Gastrointestinal:[] ???Blood in stool[] ???Vomiting blood[] ???Gastroesophageal reflux/heartburn[] ???Difficulty swallowing  Genitourinary: [] ???Chronic kidney disease [] ???Difficulturination [] ???Frequenturination [] ???Burning with urination[] ???Blood in urine Skin: [] ???Rashes [] ???Ulcers [] ???Wounds Psychological: [] ???History of anxiety[] ???History of major depression.     Physical Examination  Vitals:   10/15/20 1053  BP: (!) 177/72  Pulse: 73  Weight: 101 lb (45.8 kg)  Height: 4\' 11"  (1.499 m)   Body mass index is 20.4 kg/m. Gen:  WD/WN, NAD. Appears far younger than stated age. Head: [place]/AT, No temporalis wasting. Ear/Nose/Throat: Hearing grossly intact, nares w/o erythema or drainage, trachea  midline Eyes: Conjunctiva clear. Sclera non-icteric Neck: Supple.  Trachea midline Pulmonary:  Good air movement, equal and clear to auscultation bilaterally.  Cardiac: RRR, No JVD Vascular:  Vessel Right Left  Radial Palpable Palpable   Musculoskeletal: M/S 5/5 throughout.  No deformity or atrophy. No edema. Neurologic: CN 2-12 intact. Sensation grossly intact in extremities.  Symmetrical.  Speech is fluent. Motor exam as listed above. Psychiatric: Judgment intact, Mood & affect appropriate for pt's clinical situation. Dermatologic: No rashes or ulcers noted.  No cellulitis or open wounds.      CBC Lab Results  Component Value Date   WBC 11.4 (H) 03/22/2015   HGB 13.8 03/22/2015   HCT 42.3 03/22/2015   MCV 88.9 03/22/2015   PLT 294 03/22/2015    BMET    Component Value Date/Time   NA 142 10/05/2020 1522   K 4.1 10/05/2020 1522   CL 103 10/05/2020 1522   CO2 28 10/05/2020 1522   GLUCOSE 117 (H) 10/05/2020 1522   BUN 19 10/05/2020 1522   CREATININE 1.10 (H) 10/05/2020 1522   CALCIUM 9.6 10/05/2020 1522   GFRNONAA 48 (L) 10/05/2020 1522   GFRAA >  60 03/22/2015 0700   Estimated Creatinine Clearance: 23.2 mL/min (A) (by C-G formula based on SCr of 1.1 mg/dL (H)).  COAG No results found for: INR, PROTIME  Radiology CT Abdomen Pelvis W Contrast  Result Date: 10/13/2020 CLINICAL DATA:  History of right ovarian mass, continued surveillance. Right lower quadrant pain x2 years EXAM: CT ABDOMEN AND PELVIS WITH CONTRAST TECHNIQUE: Multidetector CT imaging of the abdomen and pelvis was performed using the standard protocol following bolus administration of intravenous contrast. CONTRAST:  50mL OMNIPAQUE IOHEXOL 300 MG/ML  SOLN COMPARISON:  Multiple priors including CT abdomen pelvis March 22, 2015 and May 15, 2018. FINDINGS: Lower chest: Emphysematous change in the lung bases. No acute findings in the chest. Normal size heart. Hepatobiliary: No suspicious hepatic lesion.  Cholecystectomy. No biliary ductal dilatation. Pancreas: Unremarkable Spleen: Unremarkable Adrenals/Urinary Tract: Bilateral adrenal glands are unremarkable. Similar appearance of the extrarenal pelvis/dilation on the right. Unremarkable course of the right ureter. Left extrarenal pelvis. No nephrolithiasis. No suspicious renal masses. Urinary bladder is partially obscured by streak artifact from hip prostheses but otherwise appears unremarkable. Stomach/Bowel: Stomach is unremarkable. Enteric contrast visualized to the level of the transverse colon. No suspicious small bowel wall thickening. Sigmoid diverticulosis without findings of diverticulitis. Vascular/Lymphatic: Aortic atherosclerosis. No enlarged abdominal or pelvic lymph nodes. Reproductive: Status post hysterectomy. Similar size of the solid right adnexal mass which measures 3.7 x 2.6 cm previously 3.6 x 2.9 on most recent prior cm and 3.7 x 2.9 cm dating back to March 22, 2015, given its stability over greater than 5 years this is favored represent an indolent/benign process. Other: No abdominopelvic ascites. Musculoskeletal: Bilateral hip prostheses. Diffuse demineralization of bone. Scoliotic curvature of the thoracolumbar spine. Similar multilevel degenerative change of the spine. IMPRESSION: 1. Similar size of the solid right adnexal mass dating back to March 22, 2015, which given its long-term stability over greater than 5 years this is favored represent an indolent/benign process. 2. Sigmoid diverticulosis without findings of diverticulitis. 3. Emphysema and aortic atherosclerosis. Aortic Atherosclerosis (ICD10-I70.0) and Emphysema (ICD10-J43.9). Electronically Signed   By: Dahlia Bailiff MD   On: 10/13/2020 15:44   VAS US CAROTID  Result Date: 10/15/2020 Carotid Arterial Duplex Study Comparison Study:  10/17/2019 Performing Technologist: Charlane Ferretti RT (R)(VS)  Examination Guidelines: A complete evaluation includes B-mode imaging, spectral  Doppler, color Doppler, and power Doppler as needed of all accessible portions of each vessel. Bilateral testing is considered an integral part of a complete examination. Limited examinations for reoccurring indications may be performed as noted.  Right Carotid Findings: +----------+--------+--------+--------+------------------+--------------------+           PSV cm/sEDV cm/sStenosisPlaque DescriptionComments             +----------+--------+--------+--------+------------------+--------------------+ CCA Prox  61      6                                 intimal thickening   +----------+--------+--------+--------+------------------+--------------------+ CCA Mid   54      8                                 intimal thickening   +----------+--------+--------+--------+------------------+--------------------+ CCA Distal61      8               calcific          intimal thickening   +----------+--------+--------+--------+------------------+--------------------+  ICA Prox  66      10              calcific          ICA/CCA ratio = 1.40 +----------+--------+--------+--------+------------------+--------------------+ ICA Mid   75      16              calcific                               +----------+--------+--------+--------+------------------+--------------------+ ICA Distal65      16                                                     +----------+--------+--------+--------+------------------+--------------------+ ECA       81      3               calcific                               +----------+--------+--------+--------+------------------+--------------------+ +----------+--------+-------+-----------+-------------------+           PSV cm/sEDV cmsDescribe   Arm Pressure (mmHG) +----------+--------+-------+-----------+-------------------+ [id number]            Multiphasic                    +----------+--------+-------+-----------+-------------------+  +---------+--------+--+--------+-+---------+ VertebralPSV cm/s68EDV cm/s9Antegrade +---------+--------+--+--------+-+---------+ Left Carotid Findings: +----------+--------+--------+--------+------------------+--------------------+           PSV cm/sEDV cm/sStenosisPlaque DescriptionComments             +----------+--------+--------+--------+------------------+--------------------+ CCA Prox  70      6                                 intimal thickening   +----------+--------+--------+--------+------------------+--------------------+ CCA Mid   80      8                                 intimal thickening   +----------+--------+--------+--------+------------------+--------------------+ CCA Distal62      9               calcific          intimal thickening   +----------+--------+--------+--------+------------------+--------------------+ ICA Prox  63      15              calcific          ICA/CCA ratio = 1.24 +----------+--------+--------+--------+------------------+--------------------+ ICA Mid   87      17                                                     +----------+--------+--------+--------+------------------+--------------------+ ICA Distal81      17                                                     +----------+--------+--------+--------+------------------+--------------------+  ECA       112     14                                                     +----------+--------+--------+--------+------------------+--------------------+ +----------+--------+--------+-----------+-------------------+           PSV cm/sEDV cm/sDescribe   Arm Pressure (mmHG) +----------+--------+--------+-----------+-------------------+ [id number]             Multiphasic                    +----------+--------+--------+-----------+-------------------+ +---------+--------+--+--------+--+---------+ VertebralPSV cm/s68EDV cm/s12Antegrade  +---------+--------+--+--------+--+---------+ Summary: Right Carotid: Velocities in the right ICA are consistent with a 1-39% stenosis. Left Carotid: Velocities in the left ICA are consistent with a 1-39% stenosis. Vertebrals:  Bilateral vertebral arteries demonstrate antegrade flow. Subclavians: Normal flow hemodynamics were seen in bilateral subclavian              arteries. *See table(s) above for measurements and observations.  Electronically signed by Leotis Pain MD on 10/15/2020 at 12:26:01 PM.    Final      Assessment/Plan Essential hypertension, benign blood pressure control important in reducing the progression of atherosclerotic disease. On appropriate oral medications.   Hyperlipidemia lipid control important in reducing the progression of atherosclerotic disease. Continue statin therapy  Carotid stenosis Her carotid disease was found to be stable on duplex today with 1 to 39% carotid artery stenosis bilaterally without significant progression from previous studies no role for intervention.  Continue current medications including aspirin and statin agent.  Recheck in 1 year.    Leotis Pain, MD  10/15/2020 2:26 PM    This note was created with Dragon medical transcription system.  Any errors from dictation are purely unintentional

## 2020-12-10 ENCOUNTER — Other Ambulatory Visit: Payer: Self-pay | Admitting: Family Medicine

## 2020-12-10 DIAGNOSIS — M542 Cervicalgia: Secondary | ICD-10-CM

## 2020-12-21 ENCOUNTER — Ambulatory Visit
Admission: RE | Admit: 2020-12-21 | Discharge: 2020-12-21 | Disposition: A | Discharge status: Home / Self Care | Payer: Medicare Other | Attending: Family Medicine | Admitting: Family Medicine

## 2020-12-21 ENCOUNTER — Other Ambulatory Visit: Payer: Self-pay

## 2020-12-29 ENCOUNTER — Other Ambulatory Visit: Payer: Self-pay

## 2020-12-29 ENCOUNTER — Ambulatory Visit: Admission: RE | Admit: 2020-12-29 | Payer: Medicare Other | Source: Home / Self Care

## 2020-12-29 ENCOUNTER — Telehealth (INDEPENDENT_AMBULATORY_CARE_PROVIDER_SITE_OTHER): Payer: Self-pay | Admitting: Vascular Surgery

## 2020-12-29 NOTE — Telephone Encounter (Signed)
Patient was made aware with medical recomendations

## 2020-12-29 NOTE — Telephone Encounter (Signed)
Called stating that she fell (jan 2022). Patient recently went to see her PCP after having lingering symptoms after fall. Patient was last seen 10/2020 with carotid studies. Dr. Kary Kos and Patient would like to come in to be seen. Please advise.

## 2020-12-29 NOTE — Telephone Encounter (Signed)
In reading Dr. Barbarann Ehlers note, it seems that they are worried about her cerebral aneurysm. We do not evaluate or treat for cerebral aneurysms.  In fact the patient does see a neurologist for this.  That would be the more appropriate referral, especially if the patient is having worsening headaches.  Her carotid artery had nearly no stenosis 2 months ago and it wouldn't suddenly change in two months.  Also, stenosis of the carotid artery does not cause headaches.  I would defer her to her neurologist first before we evaluate

## 2020-12-30 ENCOUNTER — Other Ambulatory Visit: Payer: Self-pay | Admitting: Family Medicine

## 2020-12-30 ENCOUNTER — Ambulatory Visit
Admission: RE | Admit: 2020-12-30 | Discharge: 2020-12-30 | Disposition: A | Discharge status: Home / Self Care | Payer: Medicare Other | Source: Ambulatory Visit | Attending: Family Medicine | Admitting: Family Medicine

## 2020-12-30 DIAGNOSIS — R52 Pain, unspecified: Secondary | ICD-10-CM | POA: Diagnosis not present

## 2021-01-14 ENCOUNTER — Other Ambulatory Visit: Payer: Self-pay | Admitting: Neurology

## 2021-01-14 ENCOUNTER — Other Ambulatory Visit (HOSPITAL_COMMUNITY): Payer: Self-pay | Admitting: Neurology

## 2021-01-14 DIAGNOSIS — M5481 Occipital neuralgia: Secondary | ICD-10-CM

## 2021-01-18 ENCOUNTER — Other Ambulatory Visit: Payer: Self-pay | Admitting: Neurology

## 2021-01-18 DIAGNOSIS — M5481 Occipital neuralgia: Secondary | ICD-10-CM

## 2021-01-25 ENCOUNTER — Ambulatory Visit: Payer: Medicare Other

## 2021-01-26 ENCOUNTER — Other Ambulatory Visit: Payer: Self-pay

## 2021-01-26 ENCOUNTER — Ambulatory Visit
Admission: RE | Admit: 2021-01-26 | Discharge: 2021-01-26 | Disposition: A | Discharge status: Home / Self Care | Payer: Medicare Other | Source: Ambulatory Visit | Attending: Neurology | Admitting: Neurology

## 2021-01-26 ENCOUNTER — Ambulatory Visit: Payer: Medicare Other

## 2021-01-26 DIAGNOSIS — M5481 Occipital neuralgia: Secondary | ICD-10-CM | POA: Insufficient documentation

## 2021-02-24 ENCOUNTER — Other Ambulatory Visit: Payer: Self-pay

## 2021-02-24 ENCOUNTER — Ambulatory Visit: Payer: Medicare Other | Admitting: Nurse Practitioner

## 2021-02-24 ENCOUNTER — Ambulatory Visit: Payer: Medicare Other | Admitting: Physician Assistant

## 2021-02-24 VITALS — BP 120/60 | HR 75 | Temp 98.4°F | Resp 20

## 2021-02-24 DIAGNOSIS — I6523 Occlusion and stenosis of bilateral carotid arteries: Secondary | ICD-10-CM | POA: Diagnosis not present

## 2021-02-24 DIAGNOSIS — L989 Disorder of the skin and subcutaneous tissue, unspecified: Secondary | ICD-10-CM
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
## 2021-02-28 ENCOUNTER — Other Ambulatory Visit: Payer: Self-pay

## 2021-02-28 ENCOUNTER — Encounter: Payer: Self-pay | Admitting: Dermatology

## 2021-02-28 ENCOUNTER — Ambulatory Visit (INDEPENDENT_AMBULATORY_CARE_PROVIDER_SITE_OTHER): Payer: Medicare Other | Admitting: Dermatology

## 2021-02-28 DIAGNOSIS — C44629 Squamous cell carcinoma of skin of left upper limb, including shoulder: Secondary | ICD-10-CM

## 2021-02-28 DIAGNOSIS — I6523 Occlusion and stenosis of bilateral carotid arteries: Secondary | ICD-10-CM | POA: Diagnosis not present

## 2021-02-28 DIAGNOSIS — Z85828 Personal history of other malignant neoplasm of skin: Secondary | ICD-10-CM

## 2021-02-28 DIAGNOSIS — D492 Neoplasm of unspecified behavior of bone, soft tissue, and skin: Secondary | ICD-10-CM

## 2021-02-28 DIAGNOSIS — C4492 Squamous cell carcinoma of skin, unspecified: Secondary | ICD-10-CM

## 2021-02-28 DIAGNOSIS — D692 Other nonthrombocytopenic purpura: Secondary | ICD-10-CM | POA: Diagnosis not present

## 2021-02-28 HISTORY — DX: Squamous cell carcinoma of skin, unspecified: C44.92

## 2021-02-28 NOTE — Progress Notes (Signed)
   New Patient Visit  Subjective  Jennifer Roman is a 87 y.o. female who presents for the following: check spot (L arm, 3wks, tender, pt bumped arm and then the growth started).  Patient accompanied by daughter-in-law.    The following portions of the chart were reviewed this encounter and updated as appropriate:        Review of Systems:  No other skin or systemic complaints except as noted in HPI or Assessment and Plan.  Objective  Well appearing patient in no apparent distress; mood and affect are within normal limits.  A focused examination was performed including left arm. Relevant physical exam findings are noted in the Assessment and Plan.  L upper forearm 1.1cm pink scaly nodule with surrounding purpura      Assessment & Plan  Neoplasm of skin L upper forearm  Skin / nail biopsy Type of biopsy: tangential   Informed consent: discussed and consent obtained   Anesthesia: the lesion was anesthetized in a standard fashion   Anesthesia comment:  Area prepped with alcohol Anesthetic:  1% lidocaine w/ epinephrine 1-100,000 buffered w/ 8.4% NaHCO3 (0.5% bupivicaine and 1% lido with epinephrine) Instrument used: flexible razor blade   Hemostasis achieved with: pressure, aluminum chloride and electrodesiccation   Outcome: patient tolerated procedure well    Destruction of lesion  Destruction method: electrodesiccation and curettage   Informed consent: discussed and consent obtained   Timeout:  patient name, date of birth, surgical site, and procedure verified Anesthesia: the lesion was anesthetized in a standard fashion   Anesthetic:  1% lidocaine w/ epinephrine 1-100,000 buffered w/ 8.4% NaHCO3 Curettage performed in three different directions: Yes   Electrodesiccation performed over the curetted area: Yes   Lesion length (cm):  1.1 Final wound size (cm):  1.6 Hemostasis achieved with:  pressure, aluminum chloride and electrodesiccation Outcome: patient tolerated  procedure well with no complications   Post-procedure details: wound care instructions given   Additional details:  Mupirocin ointment and Bandaid applied    Specimen 1 - Surgical pathology Differential Diagnosis: D48.5 R/O SCC  Check Margins: No 1.1cm pink scaly nodule EDC  Purpura - Chronic; persistent and recurrent.  Treatable, but not curable. - Violaceous macules and patches - Benign - Related to trauma, age, sun damage and/or use of blood thinners, chronic use of topical and/or oral steroids - Observe - Can use OTC arnica containing moisturizer such as Dermend Bruise Formula if desired - Call for worsening or other concerns  History of Squamous Cell Carcinoma of the Skin - No evidence of recurrence today - Recommend regular full body skin exams - Recommend daily broad spectrum sunscreen SPF 30+ to sun-exposed areas, reapply every 2 hours as needed.  - Call if any new or changing lesions are noted between office visits    Return in about 3 months (around 05/31/2021) for recheck bx site.  I, Othelia Pulling, RMA, am acting as scribe for Brendolyn Patty, MD .  Documentation: I have reviewed the above documentation for accuracy and completeness, and I agree with the above.  Brendolyn Patty MD

## 2021-02-28 NOTE — Patient Instructions (Signed)
If you have any questions or concerns for your doctor, please call our main line at [phone] and press option 4 to reach your doctor's medical assistant. If no one answers, please leave a voicemail as directed and we will return your call as soon as possible. Messages left after 4 pm will be answered the following business day.   You may also send us a message via MyChart. We typically respond to MyChart messages within 1-2 business days.  For prescription refills, please ask your pharmacy to contact our office. Our fax number is [phone].  If you have an urgent issue when the clinic is closed that cannot wait until the next business day, you can page your doctor at the number below.    Please note that while we do our best to be available for urgent issues outside of office hours, we are not available 24/7.   If you have an urgent issue and are unable to reach us, you may choose to seek medical care at your doctor's office, retail clinic, urgent care center, or emergency room.  If you have a medical emergency, please immediately call 911 or go to the emergency department.  Pager Numbers  - Dr. Kowalski: [phone]  - Dr. Moye: [phone]  - Dr. Stewart: [phone]  In the event of inclement weather, please call our main line at [phone] for an update on the status of any delays or closures.  Dermatology Medication Tips: Please keep the boxes that topical medications come in in order to help keep track of the instructions about where and how to use these. Pharmacies typically print the medication instructions only on the boxes and not directly on the medication tubes.   If your medication is too expensive, please contact our office at [phone] option 4 or send us a message through MyChart.   We are unable to tell what your co-pay for medications will be in advance as this is different depending on your insurance coverage. However, we may be able to find a substitute  medication at lower cost or fill out paperwork to get insurance to cover a needed medication.   If a prior authorization is required to get your medication covered by your insurance company, please allow us 1-2 business days to complete this process.  Drug prices often vary depending on where the prescription is filled and some pharmacies may offer cheaper prices.  The website www.goodrx.com contains coupons for medications through different pharmacies. The prices here do not account for what the cost may be with help from insurance (it may be cheaper with your insurance), but the website can give you the price if you did not use any insurance.  - You can print the associated coupon and take it with your prescription to the pharmacy.  - You may also stop by our office during regular business hours and pick up a GoodRx coupon card.  - If you need your prescription sent electronically to a different pharmacy, notify our office through [facility] MyChart or by phone at [phone] option 4.   Wound Care Instructions  Cleanse wound gently with soap and water once a day then pat dry with clean gauze. Apply a thing coat of Petrolatum (petroleum jelly, "Vaseline") over the wound (unless you have an allergy to this). We recommend that you use a new, sterile tube of Vaseline. Do not pick or remove scabs. Do not remove the yellow or white "healing tissue" from the base of the wound.  Cover the   wound with fresh, clean, nonstick gauze and secure with paper tape. You may use Band-Aids in place of gauze and tape if the would is small enough, but would recommend trimming much of the tape off as there is often too much. Sometimes Band-Aids can irritate the skin.  You should call the office for your biopsy report after 1 week if you have not already been contacted.  If you experience any problems, such as abnormal amounts of bleeding, swelling, significant bruising, significant pain, or evidence of infection,  please call the office immediately.  FOR ADULT SURGERY PATIENTS: If you need something for pain relief you may take 1 extra strength Tylenol (acetaminophen) AND 2 Ibuprofen (200mg each) together every 4 hours as needed for pain. (do not take these if you are allergic to them or if you have a reason you should not take them.) Typically, you may only need pain medication for 1 to 3 days.    

## 2021-03-02 ENCOUNTER — Telehealth: Payer: Self-pay

## 2021-03-02 NOTE — Telephone Encounter (Signed)
Patient advised of BX results.  °

## 2021-03-02 NOTE — Telephone Encounter (Signed)
-----   Message from Brendolyn Patty, MD sent at 03/01/2021  5:34 PM EDT ----- Skin , left upper forearm SQUAMOUS CELL CARCINOMA, KERATOACANTHOMA TYPE  SCC skin cancer- already treated with EDC at time of biopsy   - please call patient

## 2021-03-02 NOTE — Telephone Encounter (Signed)
Left pt message to call for bx results/sh °

## 2021-03-07 NOTE — Addendum Note (Signed)
Addended by: Brendolyn Patty on: 03/07/2021 10:04 AM   Modules accepted: Level of Service

## 2021-06-06 ENCOUNTER — Ambulatory Visit: Payer: Medicare Other | Admitting: Dermatology

## 2021-06-06 ENCOUNTER — Other Ambulatory Visit: Payer: Self-pay

## 2021-06-07 ENCOUNTER — Ambulatory Visit (INDEPENDENT_AMBULATORY_CARE_PROVIDER_SITE_OTHER): Payer: Medicare Other | Admitting: Dermatology

## 2021-06-07 DIAGNOSIS — L821 Other seborrheic keratosis: Secondary | ICD-10-CM

## 2021-06-07 DIAGNOSIS — I6523 Occlusion and stenosis of bilateral carotid arteries: Secondary | ICD-10-CM

## 2021-06-07 DIAGNOSIS — Z85828 Personal history of other malignant neoplasm of skin: Secondary | ICD-10-CM | POA: Diagnosis not present

## 2021-06-07 DIAGNOSIS — D171 Benign lipomatous neoplasm of skin and subcutaneous tissue of trunk: Secondary | ICD-10-CM

## 2021-06-07 DIAGNOSIS — L578 Other skin changes due to chronic exposure to nonionizing radiation: Secondary | ICD-10-CM

## 2021-06-07 NOTE — Patient Instructions (Addendum)
Seborrheic Keratosis  What causes seborrheic keratoses? Seborrheic keratoses are harmless, common skin growths that first appear during adult life.  As time goes by, more growths appear.  Some people may develop a large number of them.  Seborrheic keratoses appear on both covered and uncovered body parts.  They are not caused by sunlight.  The tendency to develop seborrheic keratoses can be inherited.  They vary in color from skin-colored to gray, brown, or even black.  They can be either smooth or have a rough, warty surface.   Seborrheic keratoses are superficial and look as if they were stuck on the skin.  Under the microscope this type of keratosis looks like layers upon layers of skin.  That is why at times the top layer may seem to fall off, but the rest of the growth remains and re-grows.    Treatment Seborrheic keratoses do not need to be treated, but can easily be removed in the office.  Seborrheic keratoses often cause symptoms when they rub on clothing or jewelry.  Lesions can be in the way of shaving.  If they become inflamed, they can cause itching, soreness, or burning.  Removal of a seborrheic keratosis can be accomplished by freezing, burning, or surgery. If any spot bleeds, scabs, or grows rapidly, please return to have it checked, as these can be an indication of a skin cancer.   If you have any questions or concerns for your doctor, please call our main line at [phone] and press option 4 to reach your doctor's medical assistant. If no one answers, please leave a voicemail as directed and we will return your call as soon as possible. Messages left after 4 pm will be answered the following business day.   You may also send [place] a message via [facility]. We typically respond to MyChart messages within 1-2 business days.  For prescription refills, please ask your pharmacy to contact our office. Our fax number is [phone].  If you have an urgent issue when the clinic is closed that  cannot wait until the next business day, you can page your doctor at the number below.    Please note that while we do our best to be available for urgent issues outside of office hours, we are not available 24/7.   If you have an urgent issue and are unable to reach [place], you may choose to seek medical care at your doctor's office, retail clinic, urgent care center, or emergency room.  If you have a medical emergency, please immediately call 911 or go to the emergency department.  Pager Numbers  - Dr. Nehemiah Massed: [phone]  - Dr. Laurence Ferrari: [phone]  - Dr. Nicole Kindred: [phone]  In the event of inclement weather, please call our main line at [phone] for an update on the status of any delays or closures.  Dermatology Medication Tips: Please keep the boxes that topical medications come in in order to help keep track of the instructions about where and how to use these. Pharmacies typically print the medication instructions only on the boxes and not directly on the medication tubes.   If your medication is too expensive, please contact our office at [phone] option 4 or send [place] a message through [facility].   We are unable to tell what your co-pay for medications will be in advance as this is different depending on your insurance coverage. However, we may be able to find a substitute medication at lower cost or fill out paperwork to get insurance to  cover a needed medication.   If a prior authorization is required to get your medication covered by your insurance company, please allow [place] 1-2 business days to complete this process.  Drug prices often vary depending on where the prescription is filled and some pharmacies may offer cheaper prices.  The website www.goodrx.com contains coupons for medications through different pharmacies. The prices here do not account for what the cost may be with help from insurance (it may be cheaper with your insurance), but the website can give you the  price if you did not use any insurance.  - You can print the associated coupon and take it with your prescription to the pharmacy.  - You may also stop by our office during regular business hours and pick up a GoodRx coupon card.  - If you need your prescription sent electronically to a different pharmacy, notify our office through [facility] or by phone at [phone] option 4.

## 2021-06-07 NOTE — Progress Notes (Signed)
   Follow-Up Visit   Subjective  Jennifer Roman is a 87 y.o. female who presents for the following: Follow-up (Patient here for 3 month follow-up hx of SCC of the left upper forearm. She also has a growth on her back/bra line that is itchy and sore at times. She states it was removed about 10 years ago, but came back.).   The following portions of the chart were reviewed this encounter and updated as appropriate:       Review of Systems:  No other skin or systemic complaints except as noted in HPI or Assessment and Plan.  Objective  Well appearing patient in no apparent distress; mood and affect are within normal limits.  A focused examination was performed including left forearm, back. Relevant physical exam findings are noted in the Assessment and Plan.  Left upper forearm Well healed scar with no evidence of recurrence.   Spinal Mid Back 5.0cm soft rubbery nodule   Assessment & Plan  Actinic Damage - chronic, secondary to cumulative UV radiation exposure/sun exposure over time - diffuse scaly erythematous macules with underlying dyspigmentation - Recommend daily broad spectrum sunscreen SPF 30+ to sun-exposed areas, reapply every 2 hours as needed.  - Recommend staying in the shade or wearing long sleeves, sun glasses (UVA+UVB protection) and wide brim hats (4-inch brim around the entire circumference of the hat). - Call for new or changing lesions.  Seborrheic Keratoses - Stuck-on, waxy, tan-brown papules and/or plaques  - Benign-appearing - Discussed benign etiology and prognosis. - Observe - Call for any changes  History of SCC (squamous cell carcinoma) of skin Left upper forearm  Clear. Observe for recurrence. Call clinic for new or changing lesions.  Recommend regular skin exams, daily broad-spectrum spf 30+ sunscreen use, and photoprotection.    Lipoma of torso Spinal Mid Back  Benign. Symptomatic.    Discussed surgical option with patient, including  resulting scar and possible recurrence. She prefers to observe for now and call if area worsens.  Return in about 6 months (around 12/05/2021) for TBSE.  IJamesetta Orleans, CMA, am acting as scribe for Brendolyn Patty, MD .  Documentation: I have reviewed the above documentation for accuracy and completeness, and I agree with the above.  Brendolyn Patty MD

## 2021-08-16 DIAGNOSIS — M19019 Primary osteoarthritis, unspecified shoulder: Secondary | ICD-10-CM | POA: Insufficient documentation

## 2021-10-14 ENCOUNTER — Encounter (INDEPENDENT_AMBULATORY_CARE_PROVIDER_SITE_OTHER): Payer: Self-pay | Admitting: Vascular Surgery

## 2021-10-14 ENCOUNTER — Encounter (INDEPENDENT_AMBULATORY_CARE_PROVIDER_SITE_OTHER): Payer: Medicare Other

## 2021-10-14 ENCOUNTER — Ambulatory Visit (INDEPENDENT_AMBULATORY_CARE_PROVIDER_SITE_OTHER): Payer: Medicare Other | Admitting: Vascular Surgery

## 2021-10-14 ENCOUNTER — Other Ambulatory Visit: Payer: Self-pay

## 2021-10-14 ENCOUNTER — Ambulatory Visit (INDEPENDENT_AMBULATORY_CARE_PROVIDER_SITE_OTHER): Payer: Medicare Other

## 2021-10-14 VITALS — BP 155/80 | HR 71 | Resp 15 | Wt 96.8 lb

## 2021-10-14 DIAGNOSIS — I10 Essential (primary) hypertension: Secondary | ICD-10-CM | POA: Diagnosis not present

## 2021-10-14 DIAGNOSIS — I6523 Occlusion and stenosis of bilateral carotid arteries: Secondary | ICD-10-CM

## 2021-10-14 DIAGNOSIS — E785 Hyperlipidemia, unspecified: Secondary | ICD-10-CM | POA: Diagnosis not present

## 2021-10-14 NOTE — Assessment & Plan Note (Signed)
Duplex today shows stable 1 to 39% ICA stenosis bilaterally.  No progression from previous study.  No change in medical regimen.  Recheck in 1 year

## 2021-10-14 NOTE — Progress Notes (Signed)
MRN : [id number]  Jennifer Roman is a 88 y.o. (26-Mar-1928) female who presents with chief complaint of  Chief Complaint  Patient presents with   Follow-up    Ultrasound follow up  .  History of Present Illness: Patient returns in follow-up of her carotid disease.  She is having a lot of pain in her right shoulder and has been told she would have to have a shoulder replacement which she has decided not to do.  I think that very reasonable given her advanced age and other issues.  At this time, no focal neurologic symptoms.  Carotid duplex reveals 1 to 39% ICA stenosis bilaterally.  Current Outpatient Medications  Medication Sig Dispense Refill   acetaminophen (TYLENOL) 500 MG tablet Take 500 mg by mouth as needed.     albuterol (PROVENTIL,VENTOLIN) 90 MCG/ACT inhaler Inhale 2 puffs into the lungs every 6 (six) hours as needed. 17 g 1   Alpha-Lipoic Acid 300 MG TABS Take 1 tablet by mouth in the morning and at bedtime.     aspirin EC 81 MG tablet Take 81 mg by mouth daily.     Biotin 1 MG CAPS Take by mouth.     Calcium Citrate-Vitamin D 315-5 MG-MCG TABS      cetirizine (ZYRTEC) 10 MG tablet Take 10 mg by mouth daily as needed.      famotidine (PEPCID) 40 MG tablet Take 40 mg by mouth daily.     losartan (COZAAR) 100 MG tablet TAKE ONE TABLET BY MOUTH EVERY DAY 30 tablet 11   Multiple Vitamins-Minerals (MULTIVITAMIN WITH MINERALS) tablet Take 1 tablet by mouth daily.     ondansetron (ZOFRAN-ODT) 4 MG disintegrating tablet DISSOLVE 1 TABLET IN MOUTH EVERY 12 HOURS AS NEEDED FOR NAUSEA OR VOMITING 30 tablet 0   tolterodine (DETROL LA) 4 MG 24 hr capsule Take 4 mg by mouth daily after supper.     triamcinolone (NASACORT) 55 MCG/ACT AERO nasal inhaler Place into the nose.     Turmeric (QC TUMERIC COMPLEX PO) Take 1,000 mg by mouth in the morning and at bedtime.     atorvastatin (LIPITOR) 10 MG tablet Take 10 mg by mouth daily with supper. (Patient not taking: Reported on 10/14/2021)      budesonide-formoterol (SYMBICORT) 80-4.5 MCG/ACT inhaler Inhale 2 puffs into the lungs as needed. (Patient not taking: Reported on 02/28/2021)     fluticasone (FLONASE) 50 MCG/ACT nasal spray USE 2 SPRAYS IN BOTH NOSTRILS ONCE DAILY (Patient not taking: Reported on 10/14/2021)     mometasone (NASONEX) 50 MCG/ACT nasal spray Place 2 sprays into the nose as needed. (Patient not taking: Reported on 10/14/2021) 17 g 3   OVER THE COUNTER MEDICATION 1 CBD Gummi  at hs (Patient not taking: Reported on 10/14/2021)     OVER THE COUNTER MEDICATION CBD Cream  and roll on topically as needed (Patient not taking: Reported on 10/14/2021)     POTASSIUM GLUCONATE PO Take 595 mg by mouth daily with supper. (Patient not taking: Reported on 10/14/2021)     SYMBICORT 80-4.5 MCG/ACT inhaler INHALE 2 PUFFS INTO THE LUNGS 2 TIMES A DAY AS DIRECTED (Patient not taking: Reported on 10/14/2021) 10.2 g 11   THEANINE PO Take by mouth. Tincture  1/3 dropper under the tongue for 30 seconds at mid day (Patient not taking: Reported on 10/14/2021)     theophylline (UNIPHYL) 400 MG 24 hr tablet TAKE ONE-HALF TABLET BY MOUTH EVERY DAY (Patient not taking: Reported on  10/14/2021) 45 tablet 0   UNABLE TO FIND CBD 2000 mg      1/2 dropper po  tid daily (Patient not taking: Reported on 10/14/2021)     No current facility-administered medications for this visit.    Past Medical History:  Diagnosis Date   Allergy    Asthma    Cancer ([place])    lung   Colonic polyp    GERD (gastroesophageal reflux disease)    Hyperlipidemia    Hypertension    Osteoporosis    osteoarthritis   Squamous cell carcinoma of skin    R leg, txted in past by Dr. Sharlett Iles   Squamous cell carcinoma of skin 02/28/2021   L upper forearm, EDC   Urinary incontinence    Vertigo    chronic    Past Surgical History:  Procedure Laterality Date   APPENDECTOMY     BLADDER REPAIR     CHOLECYSTECTOMY     esophageal hernia  01/2007   repaired x 4   EYE SURGERY     cataract  extraction, bilateral   JOINT REPLACEMENT  02/2010   Left THR   JOINT REPLACEMENT  5/12   Right THR--Dr Hooten   VAGINAL HYSTERECTOMY           Family History  Problem Relation Age of Onset   Stroke Father     Pulmonary embolism Father    No bleeding disorders No aneurysms   Social History Social History             Tobacco Use   Smoking status: Never Smoker   Smokeless tobacco: Never Used  Substance Use Topics   Alcohol use: No      Alcohol/week: 0.0 oz   Drug use: No  Widowed            Allergies  Allergen Reactions   Promethazine Hcl        Psychotic reaction----tolerates zofran   Lactose Diarrhea   Scopolamine        Rash to pills              REVIEW OF SYSTEMS (Negative unless checked)   Constitutional: [] Weight loss  [] Fever  [] Chills Cardiac: [] Chest pain   [] Chest pressure   [] Palpitations   [] Shortness of breath when laying flat   [] Shortness of breath at rest   [] Shortness of breath with exertion. Vascular:  [] Pain in legs with walking   [] Pain in legs at rest   [] Pain in legs when laying flat   [] Claudication   [] Pain in feet when walking  [] Pain in feet at rest  [] Pain in feet when laying flat   [] History of DVT   [] Phlebitis   [] Swelling in legs   [] Varicose veins   [] Non-healing ulcers Pulmonary:   [] Uses home oxygen   [] Productive cough   [] Hemoptysis   [] Wheeze  [] COPD   [] Asthma Neurologic:  [x] Dizziness  [] Blackouts   [] Seizures   [] History of stroke   [] History of TIA  [] Aphasia   [] Temporary blindness   [] Dysphagia   [] Weakness or numbness in arms   [] Weakness or numbness in legs Musculoskeletal:  [x] Arthritis   [] Joint swelling   [x] Joint pain   [] Low back pain Hematologic:  [] Easy bruising  [] Easy bleeding   [] Hypercoagulable state   [] Anemic  [] Hepatitis  Gastrointestinal:  [] Blood in stool   [] Vomiting blood  [] Gastroesophageal reflux/heartburn   [] Difficulty swallowing   Genitourinary:  [] Chronic kidney disease   [] Difficult urination   [] Frequent  urination  [] Burning with urination   [] Blood in urine Skin:  [] Rashes   [] Ulcers   [] Wounds Psychological:  [] History of anxiety   []  History of major depression.     Physical Examination  Vitals:   10/14/21 1052  BP: (!) 155/80  Pulse: 71  Resp: 15  Weight: 96 lb 12.8 oz (43.9 kg)   Body mass index is 19.55 kg/m. Gen:  WD/WN, NAD. Appears younger than stated age. Head: [place]/AT, No temporalis wasting. Ear/Nose/Throat: Hearing grossly intact, nares w/o erythema or drainage, trachea midline Eyes: Conjunctiva clear. Sclera non-icteric Neck: Supple.  no bruit  Pulmonary:  Good air movement, equal and clear to auscultation bilaterally.  Cardiac: RRR, No JVD Vascular:  Vessel Right Left  Radial Palpable Palpable           Musculoskeletal: M/S 5/5 throughout.  No deformity or atrophy. No edema. Neurologic: CN 2-12 intact. Sensation grossly intact in extremities.  Symmetrical.  Speech is fluent. Motor exam as listed above. Psychiatric: Judgment intact, Mood & affect appropriate for pt's clinical situation. Dermatologic: No rashes or ulcers noted.  No cellulitis or open wounds.     CBC Lab Results  Component Value Date   WBC 11.4 (H) 03/22/2015   HGB 13.8 03/22/2015   HCT 42.3 03/22/2015   MCV 88.9 03/22/2015   PLT 294 03/22/2015    BMET    Component Value Date/Time   NA 142 10/05/2020 1522   K 4.1 10/05/2020 1522   CL 103 10/05/2020 1522   CO2 28 10/05/2020 1522   GLUCOSE 117 (H) 10/05/2020 1522   BUN 19 10/05/2020 1522   CREATININE 1.10 (H) 10/05/2020 1522   CALCIUM 9.6 10/05/2020 1522   GFRNONAA 48 (L) 10/05/2020 1522   GFRAA >60 03/22/2015 0700   CrCl cannot be calculated (Patient's most recent lab result is older than the maximum 21 days allowed.).  COAG No results found for: INR, PROTIME  Radiology No results found.   Assessment/Plan Essential hypertension, benign blood pressure control important in reducing the progression of  atherosclerotic disease. On appropriate oral medications.     Hyperlipidemia lipid control important in reducing the progression of atherosclerotic disease. Continue statin therapy  Carotid stenosis Duplex today shows stable 1 to 39% ICA stenosis bilaterally.  No progression from previous study.  No change in medical regimen.  Recheck in 1 year    Leotis Pain, MD  10/14/2021 11:58 AM    This note was created with Dragon medical transcription system.  Any errors from dictation are purely unintentional

## 2021-12-20 ENCOUNTER — Ambulatory Visit: Payer: Medicare Other | Admitting: Dermatology

## 2022-01-02 ENCOUNTER — Ambulatory Visit (INDEPENDENT_AMBULATORY_CARE_PROVIDER_SITE_OTHER): Payer: Medicare Other | Admitting: Dermatology

## 2022-01-02 DIAGNOSIS — D229 Melanocytic nevi, unspecified: Secondary | ICD-10-CM

## 2022-01-02 DIAGNOSIS — D692 Other nonthrombocytopenic purpura: Secondary | ICD-10-CM | POA: Diagnosis not present

## 2022-01-02 DIAGNOSIS — L820 Inflamed seborrheic keratosis: Secondary | ICD-10-CM | POA: Diagnosis not present

## 2022-01-02 DIAGNOSIS — D1800 Hemangioma unspecified site: Secondary | ICD-10-CM

## 2022-01-02 DIAGNOSIS — L578 Other skin changes due to chronic exposure to nonionizing radiation: Secondary | ICD-10-CM | POA: Diagnosis not present

## 2022-01-02 DIAGNOSIS — L821 Other seborrheic keratosis: Secondary | ICD-10-CM

## 2022-01-02 DIAGNOSIS — D2239 Melanocytic nevi of other parts of face: Secondary | ICD-10-CM

## 2022-01-02 DIAGNOSIS — I6523 Occlusion and stenosis of bilateral carotid arteries: Secondary | ICD-10-CM | POA: Diagnosis not present

## 2022-01-02 DIAGNOSIS — Z1283 Encounter for screening for malignant neoplasm of skin: Secondary | ICD-10-CM

## 2022-01-02 DIAGNOSIS — D239 Other benign neoplasm of skin, unspecified: Secondary | ICD-10-CM

## 2022-01-02 DIAGNOSIS — D171 Benign lipomatous neoplasm of skin and subcutaneous tissue of trunk: Secondary | ICD-10-CM

## 2022-01-02 DIAGNOSIS — D2339 Other benign neoplasm of skin of other parts of face: Secondary | ICD-10-CM

## 2022-01-02 DIAGNOSIS — Z85828 Personal history of other malignant neoplasm of skin: Secondary | ICD-10-CM

## 2022-01-02 DIAGNOSIS — L814 Other melanin hyperpigmentation: Secondary | ICD-10-CM

## 2022-01-02 NOTE — Progress Notes (Signed)
? ?Follow-Up Visit ?  ?Subjective  ?Jennifer Roman is a 88 y.o. female who presents for the following: Follow-up. ? ?The patient presents for Total-Body Skin Exam (TBSE) for skin cancer screening and mole check.  The patient has spots, moles and lesions to be evaluated, some may be new or changing and the patient has concerns that these could be cancer. She has a new spot on her left hand, present for 1 month. She has a history of SCC of the left upper forearm and right leg.  ? ?The following portions of the chart were reviewed this encounter and updated as appropriate:  ?  ?  ? ?Review of Systems:  No other skin or systemic complaints except as noted in HPI or Assessment and Plan. ? ?Objective  ?Well appearing patient in no apparent distress; mood and affect are within normal limits. ? ?A full examination was performed including scalp, head, eyes, ears, nose, lips, neck, chest, axillae, abdomen, back, buttocks, bilateral upper extremities, bilateral lower extremities, hands, feet, fingers, toes, fingernails, and toenails. All findings within normal limits unless otherwise noted below. ? ?Left Upper Forearm, Right leg ?Well healed scar with no evidence of recurrence.  ? ?L spinal mid back ?5.0 x 4.0 cm soft subcutaneous nodule ? ?Left Hand Dorsum ?Keratotic papule ? ?Right Mid Cheek ?45m pink flesh firm papule of the right mid cheek with similar 347mflesh papule medial ? ?Left Medial Cheek ?Dilated pore ? ? ? ?Assessment & Plan  ?Skin cancer screening performed today. ? ?Actinic Damage ?- chronic, secondary to cumulative UV radiation exposure/sun exposure over time ?- diffuse scaly erythematous macules with underlying dyspigmentation ?- Recommend daily broad spectrum sunscreen SPF 30+ to sun-exposed areas, reapply every 2 hours as needed.  ?- Recommend staying in the shade or wearing long sleeves, sun glasses (UVA+UVB protection) and wide brim hats (4-inch brim around the entire circumference of the hat). ?- Call  for new or changing lesions. ? ?Hemangiomas ?- Red papules ?- Discussed benign nature ?- Observe ?- Call for any changes ? ?Lentigines ?- Scattered tan macules ?- Due to sun exposure ?- Benign-appering, observe ?- Recommend daily broad spectrum sunscreen SPF 30+ to sun-exposed areas, reapply every 2 hours as needed. ?- Call for any changes ? ?Seborrheic Keratoses ?- Stuck-on, waxy, tan-brown papules and/or plaques  ?- Benign-appearing ?- Discussed benign etiology and prognosis. ?- Observe ?- Call for any changes ? ?Purpura - Chronic; persistent and recurrent.  Treatable, but not curable. ?- Violaceous macules and patches ?- Benign ?- Related to trauma, age, sun damage and/or use of blood thinners, chronic use of topical and/or oral steroids ?- Observe ?- Can use OTC arnica containing moisturizer such as Dermend Bruise Formula if desired ?- Call for worsening or other concerns ? ?History of SCC (squamous cell carcinoma) of skin ?Left Upper Forearm, Right leg ? ?Clear. Observe for recurrence. Call clinic for new or changing lesions.  Recommend regular skin exams, daily broad-spectrum spf 30+ sunscreen use, and photoprotection.   ? ?Lipoma of torso ?L spinal mid back ? ?Benign, observe.  ? ? ? ? ?Inflamed seborrheic keratosis ?Left Hand Dorsum ? ?vs Hypertrophic AK ? ?RTC if doesn't clear ? ?Destruction of lesion - Left Hand Dorsum ? ?Destruction method: cryotherapy   ?Informed consent: discussed and consent obtained   ?Lesion destroyed using liquid nitrogen: Yes   ?Region frozen until ice ball extended beyond lesion: Yes   ?Outcome: patient tolerated procedure well with no complications   ?Post-procedure details:  wound care instructions given   ?Additional details:  Prior to procedure, discussed risks of blister formation, small wound, skin dyspigmentation, or rare scar following cryotherapy. Recommend Vaseline ointment to treated areas while healing. ? ? ?Nevus ?Right Mid Cheek ? ?vs Sebaceous  Hyperplasia ? ?Benign-appearing.  Observation.  Call clinic for new or changing moles.  Recommend daily use of broad spectrum spf 30+ sunscreen to sun-exposed areas.  ? ?Dilated pore of [place] ?Left Medial Cheek ? ?Benign, observe.  ? ? ? ?Return in about 1 year (around 01/03/2023) for TBSE, Hx SCC. ? ?I, Jamesetta Orleans, CMA, am acting as scribe for Brendolyn Patty, MD . ?Documentation: I have reviewed the above documentation for accuracy and completeness, and I agree with the above. ? ?Brendolyn Patty MD  ? ? ?

## 2022-01-02 NOTE — Patient Instructions (Addendum)

## 2022-10-13 ENCOUNTER — Ambulatory Visit (INDEPENDENT_AMBULATORY_CARE_PROVIDER_SITE_OTHER): Payer: Medicare Other

## 2022-10-13 ENCOUNTER — Encounter (INDEPENDENT_AMBULATORY_CARE_PROVIDER_SITE_OTHER): Payer: Self-pay | Admitting: Vascular Surgery

## 2022-10-13 ENCOUNTER — Ambulatory Visit (INDEPENDENT_AMBULATORY_CARE_PROVIDER_SITE_OTHER): Payer: Medicare Other | Admitting: Vascular Surgery

## 2022-10-13 VITALS — BP 188/77 | HR 65 | Ht 59.0 in | Wt 92.0 lb

## 2022-10-13 DIAGNOSIS — I10 Essential (primary) hypertension: Secondary | ICD-10-CM | POA: Diagnosis not present

## 2022-10-13 DIAGNOSIS — J4520 Mild intermittent asthma, uncomplicated: Secondary | ICD-10-CM

## 2022-10-13 DIAGNOSIS — I6523 Occlusion and stenosis of bilateral carotid arteries: Secondary | ICD-10-CM

## 2022-10-13 DIAGNOSIS — E785 Hyperlipidemia, unspecified: Secondary | ICD-10-CM

## 2022-10-13 DIAGNOSIS — I671 Cerebral aneurysm, nonruptured: Secondary | ICD-10-CM

## 2022-10-13 NOTE — Assessment & Plan Note (Signed)
Her breathing has suffered recently and she is having a lot of asthma type symptoms that she had had when she was younger.  Given her hoarseness, worsening respiratory symptoms, lingering COVID symptoms, I think a referral to a pulmonologist would be prudent at this point.

## 2022-10-13 NOTE — Assessment & Plan Note (Signed)
Her carotid duplex today reveals stable 1 to 39% ICA stenosis bilaterally without significant progression from previous study.  This does not assess her intracranial circulation, and with her persistent headaches and previous intracranial issues, I am going to order an MRI/MRI of the brain.  We can see her back following the study.  Would plan follow-up carotid duplex in 1 year.

## 2022-10-13 NOTE — Assessment & Plan Note (Signed)
Previously small.  With intractable headaches, I think an MRI/MRI of the brain would be prudent

## 2022-10-13 NOTE — Progress Notes (Signed)
MRN : [id number]  Jennifer Roman is a 89 y.o. ([birth year]) female who presents with chief complaint of  Chief Complaint  Patient presents with   Follow-up    1 year follow up with carotid  .  History of Present Illness: Patient returns in follow-up of her carotid disease.  She is having a lot of issues and has had continuous drainage in her throat.  She seen ENT many times without significant improvement.  This is now settled in her chest and throat and she is extremely hoarse.  She has developed COVID about a month ago and has been getting medications has not fully recovered.  She is also having a severe unrelenting headache which is new.  This has been going on for about 2 months now.  She has a history of intracranial issues with aneurysm.  We have followed her for cervical carotid stenosis.  Her carotid duplex today reveals stable 1 to 39% ICA stenosis bilaterally without significant progression from previous study.  Current Outpatient Medications  Medication Sig Dispense Refill   acetaminophen (TYLENOL) 500 MG tablet Take 500 mg by mouth as needed.     Alpha-Lipoic Acid 300 MG TABS Take 1 tablet by mouth in the morning and at bedtime.     aspirin EC 81 MG tablet Take 81 mg by mouth daily.     Biotin 1 MG CAPS Take by mouth.     Calcium Citrate-Vitamin D 315-5 MG-MCG TABS      famotidine (PEPCID) 40 MG tablet Take 40 mg by mouth daily.     losartan (COZAAR) 100 MG tablet TAKE ONE TABLET BY MOUTH EVERY DAY 30 tablet 11   Multiple Vitamins-Minerals (MULTIVITAMIN WITH MINERALS) tablet Take 1 tablet by mouth daily.     ondansetron (ZOFRAN-ODT) 4 MG disintegrating tablet DISSOLVE 1 TABLET IN MOUTH EVERY 12 HOURS AS NEEDED FOR NAUSEA OR VOMITING 30 tablet 0   tolterodine (DETROL LA) 4 MG 24 hr capsule Take 4 mg by mouth daily after supper.     Turmeric (QC TUMERIC COMPLEX PO) Take 1,000 mg by mouth in the morning and at bedtime.     No current facility-administered medications for this  visit.    Past Medical History:  Diagnosis Date   Allergy    Asthma    Cancer ([place])    lung   Colonic polyp    GERD (gastroesophageal reflux disease)    Hyperlipidemia    Hypertension    Osteoporosis    osteoarthritis   Squamous cell carcinoma of skin    R leg, txted in past by Dr. Sharlett Iles   Squamous cell carcinoma of skin 02/28/2021   L upper forearm, [facility]   Urinary incontinence    Vertigo    chronic    Past Surgical History:  Procedure Laterality Date   APPENDECTOMY     BLADDER REPAIR     CHOLECYSTECTOMY     esophageal hernia  01/2007   repaired x 4   EYE SURGERY     cataract extraction, bilateral   JOINT REPLACEMENT  02/2010   Left THR   JOINT REPLACEMENT  5/12   Right THR--Dr Hooten   VAGINAL HYSTERECTOMY       Social History   Tobacco Use   Smoking status: Never   Smokeless tobacco: Never  Substance Use Topics   Alcohol use: No    Alcohol/week: 0.0 standard drinks of alcohol   Drug use: No      Family History  Problem Relation Age of Onset   Stroke Father    Pulmonary embolism Father   No bleeding or clotting disorders  Allergies  Allergen Reactions   Promethazine Hcl     Psychotic reaction----tolerates zofran   Lactose Diarrhea   Scopolamine     Rash to pills     REVIEW OF SYSTEMS (Negative unless checked)  Constitutional: '[]'$ Weight loss  '[]'$ Fever  '[]'$ Chills Cardiac: '[]'$ Chest pain   '[]'$ Chest pressure   '[]'$ Palpitations   '[]'$ Shortness of breath when laying flat   '[]'$ Shortness of breath at rest   '[x]'$ Shortness of breath with exertion. Vascular:  '[]'$ Pain in legs with walking   '[]'$ Pain in legs at rest   '[]'$ Pain in legs when laying flat   '[]'$ Claudication   '[]'$ Pain in feet when walking  '[]'$ Pain in feet at rest  '[]'$ Pain in feet when laying flat   '[]'$ History of DVT   '[]'$ Phlebitis   '[]'$ Swelling in legs   '[]'$ Varicose veins   '[]'$ Non-healing ulcers Pulmonary:   '[]'$ Uses home oxygen   '[]'$ Productive cough   '[]'$ Hemoptysis   '[]'$ Wheeze  '[]'$ COPD   '[]'$ Asthma Neurologic:   '[x]'$ Dizziness  '[]'$ Blackouts   '[]'$ Seizures   '[]'$ History of stroke   '[]'$ History of TIA  '[]'$ Aphasia   '[]'$ Temporary blindness   '[]'$ Dysphagia   '[]'$ Weakness or numbness in arms   '[]'$ Weakness or numbness in legs X positive for severe headache Musculoskeletal:  '[x]'$ Arthritis   '[]'$ Joint swelling   '[]'$ Joint pain   '[]'$ Low back pain Hematologic:  '[]'$ Easy bruising  '[]'$ Easy bleeding   '[]'$ Hypercoagulable state   '[]'$ Anemic  '[]'$ Hepatitis Gastrointestinal:  '[]'$ Blood in stool   '[]'$ Vomiting blood  '[]'$ Gastroesophageal reflux/heartburn   '[]'$ Difficulty swallowing. Genitourinary:  '[]'$ Chronic kidney disease   '[]'$ Difficult urination  '[]'$ Frequent urination  '[]'$ Burning with urination   '[]'$ Blood in urine Skin:  '[]'$ Rashes   '[]'$ Ulcers   '[]'$ Wounds Psychological:  '[]'$ History of anxiety   '[]'$  History of major depression.  Physical Examination  Vitals:   10/13/22 1010  BP: (!) 188/77  Pulse: 65  Weight: 92 lb (41.7 kg)  Height: '4\' 11"'$  (1.499 m)   Body mass index is 18.58 kg/m. Gen:  WD/WN, NAD. Appears far younger than stated age. Head: [place]/AT, No temporalis wasting. Ear/Nose/Throat: Hearing grossly intact, nares w/o erythema or drainage, trachea midline Eyes: Conjunctiva clear. Sclera non-icteric Neck: Supple.  No bruit. Trachea midline  Pulmonary:  Good air movement, equal and clear to auscultation bilaterally.  Cardiac: RRR, No JVD Vascular:  Vessel Right Left  Radial Palpable Palpable       Musculoskeletal: M/S 5/5 throughout.  No deformity or atrophy. No edema. Neurologic: CN 2-12 intact. Sensation grossly intact in extremities.  Symmetrical.  Speech is fluent. Motor exam as listed above. Psychiatric: Judgment intact, Mood & affect appropriate for pt's clinical situation. Dermatologic: No rashes or ulcers noted.  No cellulitis or open wounds.   CBC Lab Results  Component Value Date   WBC 11.4 (H) 03/22/2015   HGB 13.8 03/22/2015   HCT 42.3 03/22/2015   MCV 88.9 03/22/2015   PLT 294 03/22/2015    BMET    Component Value Date/Time    NA 142 10/05/2020 1522   K 4.1 10/05/2020 1522   CL 103 10/05/2020 1522   CO2 28 10/05/2020 1522   GLUCOSE 117 (H) 10/05/2020 1522   BUN 19 10/05/2020 1522   CREATININE 1.10 (H) 10/05/2020 1522   CALCIUM 9.6 10/05/2020 1522   GFRNONAA 48 (L) 10/05/2020 1522   GFRAA >60 03/22/2015 0700   CrCl cannot be  calculated (Patient's most recent lab result is older than the maximum 21 days allowed.).  COAG No results found for: "INR", "PROTIME"  Radiology No results found.   Assessment/Plan Carotid stenosis Her carotid duplex today reveals stable 1 to 39% ICA stenosis bilaterally without significant progression from previous study.  This does not assess her intracranial circulation, and with her persistent headaches and previous intracranial issues, I am going to order an MRI/MRI of the brain.  We can see her back following the study.  Would plan follow-up carotid duplex in 1 year.  Berry aneurysm Previously small.  With intractable headaches, I think an MRI/MRI of the brain would be prudent  Mild intermittent asthma in adult without complication Her breathing has suffered recently and she is having a lot of asthma type symptoms that she had had when she was younger.  Given her hoarseness, worsening respiratory symptoms, lingering COVID symptoms, I think a referral to a pulmonologist would be prudent at this point.  Essential hypertension, benign blood pressure control important in reducing the progression of atherosclerotic disease. On appropriate oral medications.     Hyperlipidemia lipid control important in reducing the progression of atherosclerotic disease. Continue statin therapy   Leotis Pain, MD  10/13/2022 12:29 PM    This note was created with Dragon medical transcription system.  Any errors from dictation are purely unintentional

## 2022-10-27 ENCOUNTER — Ambulatory Visit
Admission: RE | Admit: 2022-10-27 | Discharge: 2022-10-27 | Disposition: A | Discharge status: Home / Self Care | Payer: Medicare Other | Source: Ambulatory Visit | Attending: Vascular Surgery | Admitting: Vascular Surgery

## 2022-10-27 DIAGNOSIS — I6523 Occlusion and stenosis of bilateral carotid arteries: Secondary | ICD-10-CM

## 2022-10-27 DIAGNOSIS — I671 Cerebral aneurysm, nonruptured: Secondary | ICD-10-CM | POA: Diagnosis present

## 2022-11-07 ENCOUNTER — Telehealth: Payer: Self-pay | Admitting: Pulmonary Disease

## 2022-11-07 ENCOUNTER — Ambulatory Visit (INDEPENDENT_AMBULATORY_CARE_PROVIDER_SITE_OTHER): Payer: Medicare Other | Admitting: Vascular Surgery

## 2022-11-07 ENCOUNTER — Encounter (INDEPENDENT_AMBULATORY_CARE_PROVIDER_SITE_OTHER): Payer: Self-pay | Admitting: Vascular Surgery

## 2022-11-07 VITALS — BP 181/68 | HR 67 | Resp 16 | Ht 59.0 in | Wt 97.0 lb

## 2022-11-07 DIAGNOSIS — I6523 Occlusion and stenosis of bilateral carotid arteries: Secondary | ICD-10-CM

## 2022-11-07 DIAGNOSIS — I671 Cerebral aneurysm, nonruptured: Secondary | ICD-10-CM | POA: Diagnosis not present

## 2022-11-07 DIAGNOSIS — I10 Essential (primary) hypertension: Secondary | ICD-10-CM

## 2022-11-07 DIAGNOSIS — J4520 Mild intermittent asthma, uncomplicated: Secondary | ICD-10-CM | POA: Diagnosis not present

## 2022-11-07 DIAGNOSIS — E785 Hyperlipidemia, unspecified: Secondary | ICD-10-CM

## 2022-11-07 NOTE — Progress Notes (Signed)
MRN : [id number]  Jennifer Roman is a 89 y.o. (September 20, 1927) female who presents with chief complaint of No chief complaint on file. Marland Kitchen  History of Present Illness: Patient returns today in follow up of carotid disease, headaches, and intracranial aneurysm. She has undergone an MRA which I have independently reviewed.  No changes clinically.  Recent MRA shows no change in her 2 mm right A2 ACA aneurysm.  No large vessel occlusion or acute changes seen.    Current Outpatient Medications  Medication Sig Dispense Refill   acetaminophen (TYLENOL) 500 MG tablet Take 500 mg by mouth as needed.     Alpha-Lipoic Acid 300 MG TABS Take 1 tablet by mouth in the morning and at bedtime.     aspirin EC 81 MG tablet Take 81 mg by mouth daily.     Biotin 1 MG CAPS Take by mouth.     Calcium Citrate-Vitamin D 315-5 MG-MCG TABS      famotidine (PEPCID) 40 MG tablet Take 40 mg by mouth daily.     losartan (COZAAR) 100 MG tablet TAKE ONE TABLET BY MOUTH EVERY DAY 30 tablet 11   Multiple Vitamins-Minerals (MULTIVITAMIN WITH MINERALS) tablet Take 1 tablet by mouth daily.     ondansetron (ZOFRAN-ODT) 4 MG disintegrating tablet DISSOLVE 1 TABLET IN MOUTH EVERY 12 HOURS AS NEEDED FOR NAUSEA OR VOMITING 30 tablet 0   tolterodine (DETROL LA) 4 MG 24 hr capsule Take 4 mg by mouth daily after supper.     Turmeric (QC TUMERIC COMPLEX PO) Take 1,000 mg by mouth in the morning and at bedtime.     No current facility-administered medications for this visit.    Past Medical History:  Diagnosis Date   Allergy    Asthma    Cancer ([place])    lung   Colonic polyp    GERD (gastroesophageal reflux disease)    Hyperlipidemia    Hypertension    Osteoporosis    osteoarthritis   Squamous cell carcinoma of skin    R leg, txted in past by Dr. Sharlett Iles   Squamous cell carcinoma of skin 02/28/2021   L upper forearm, [facility]   Urinary incontinence    Vertigo    chronic    Past Surgical History:  Procedure Laterality Date    APPENDECTOMY     BLADDER REPAIR     CHOLECYSTECTOMY     esophageal hernia  01/2007   repaired x 4   EYE SURGERY     cataract extraction, bilateral   JOINT REPLACEMENT  02/2010   Left THR   JOINT REPLACEMENT  5/12   Right THR--Dr Hooten   VAGINAL HYSTERECTOMY       Social History   Tobacco Use   Smoking status: Never   Smokeless tobacco: Never  Substance Use Topics   Alcohol use: No    Alcohol/week: 0.0 standard drinks of alcohol   Drug use: No       Family History  Problem Relation Age of Onset   Stroke Father    Pulmonary embolism Father      Allergies  Allergen Reactions   Promethazine Hcl     Psychotic reaction----tolerates zofran   Lactose Diarrhea   Scopolamine     Rash to pills     REVIEW OF SYSTEMS (Negative unless checked)   Constitutional: '[]'$ Weight loss  '[]'$ Fever  '[]'$ Chills Cardiac: '[]'$ Chest pain   '[]'$ Chest pressure   '[]'$ Palpitations   '[]'$ Shortness of breath when laying flat   '[]'$   Shortness of breath at rest   '[x]'$ Shortness of breath with exertion. Vascular:  '[]'$ Pain in legs with walking   '[]'$ Pain in legs at rest   '[]'$ Pain in legs when laying flat   '[]'$ Claudication   '[]'$ Pain in feet when walking  '[]'$ Pain in feet at rest  '[]'$ Pain in feet when laying flat   '[]'$ History of DVT   '[]'$ Phlebitis   '[]'$ Swelling in legs   '[]'$ Varicose veins   '[]'$ Non-healing ulcers Pulmonary:   '[]'$ Uses home oxygen   '[]'$ Productive cough   '[]'$ Hemoptysis   '[]'$ Wheeze  '[]'$ COPD   '[]'$ Asthma Neurologic:  '[x]'$ Dizziness  '[]'$ Blackouts   '[]'$ Seizures   '[]'$ History of stroke   '[]'$ History of TIA  '[]'$ Aphasia   '[]'$ Temporary blindness   '[]'$ Dysphagia   '[]'$ Weakness or numbness in arms   '[]'$ Weakness or numbness in legs X positive for severe headache Musculoskeletal:  '[x]'$ Arthritis   '[]'$ Joint swelling   '[]'$ Joint pain   '[]'$ Low back pain Hematologic:  '[]'$ Easy bruising  '[]'$ Easy bleeding   '[]'$ Hypercoagulable state   '[]'$ Anemic  '[]'$ Hepatitis Gastrointestinal:  '[]'$ Blood in stool   '[]'$ Vomiting blood  '[]'$ Gastroesophageal reflux/heartburn   '[]'$ Difficulty  swallowing. Genitourinary:  '[]'$ Chronic kidney disease   '[]'$ Difficult urination  '[]'$ Frequent urination  '[]'$ Burning with urination   '[]'$ Blood in urine Skin:  '[]'$ Rashes   '[]'$ Ulcers   '[]'$ Wounds Psychological:  '[]'$ History of anxiety   '[]'$  History of major depression.    Physical Examination  There were no vitals taken for this visit. Gen:  WD/WN, NAD. Appears younger than stated age. Head: [place]/AT, No temporalis wasting. Ear/Nose/Throat: Hearing grossly intact, nares w/o erythema or drainage Eyes: Conjunctiva clear. Sclera non-icteric Neck: Supple.  Trachea midline Pulmonary:  Good air movement, no use of accessory muscles.  Cardiac: RRR, no JVD Vascular:  Vessel Right Left  Radial Palpable Palpable           Musculoskeletal: M/S 5/5 throughout.  No deformity or atrophy. No edema. Neurologic: Sensation grossly intact in extremities.  Symmetrical.  Speech is fluent.  Psychiatric: Judgment intact, Mood & affect appropriate for pt's clinical situation. Dermatologic: No rashes or ulcers noted.  No cellulitis or open wounds.      Labs No results found for this or any previous visit (from the past 2160 hour(s)).  Radiology MR ANGIO HEAD WO CONTRAST  Result Date: 10/28/2022 CLINICAL DATA:  Bilateral intradural vertebral arteries, basilar artery and bilateral posterior cerebral arteries are patent without EXAM: MRA HEAD WITHOUT CONTRAST TECHNIQUE: Angiographic images of the Circle of Willis were acquired using MRA technique without intravenous contrast. COMPARISON:  MRA 09/28/2016. FINDINGS: Anterior circulation: Bilateral intracranial ICAs, MCAs, and ACAs are patent without proximal hemodynamically significant stenosis. Similar minimal fusiform dilation of the right A2 ACA, maximal diameter 2 mm. Posterior circulation: Bilateral intradural vertebral arteries, basilar artery and bilateral posterior cerebral arteries are patent without proximal hemodynamically significant stenosis. IMPRESSION: 1. Similar  minimal fusiform dilation of the right A2 ACA. 2. No large vessel occlusion or proximal hemodynamically significant stenosis. Electronically Signed   By: Margaretha Sheffield M.D.   On: 10/28/2022 16:41   VAS US CAROTID  Result Date: 10/20/2022 Carotid Arterial Duplex Study Patient Name:  Jennifer Roman  Date of Exam:   10/13/2022 Medical Rec #: [id number]       Accession #:    [id number] Date of Birth: 1927-12-09      Patient Gender: F Patient Age:   3 years Exam Location:  [facility] Procedure:      VAS US CAROTID Referring Phys:  Byanca Kasper --------------------------------------------------------------------------------  Indications: Carotid artery disease. Performing Technologist: Concha Norway RVT  Examination Guidelines: A complete evaluation includes B-mode imaging, spectral Doppler, color Doppler, and power Doppler as needed of all accessible portions of each vessel. Bilateral testing is considered an integral part of a complete examination. Limited examinations for reoccurring indications may be performed as noted.  Right Carotid Findings: +----------+--------+--------+--------+-------------------+--------+           PSV cm/sEDV cm/sStenosisPlaque Description Comments +----------+--------+--------+--------+-------------------+--------+ CCA Prox  66      13                                          +----------+--------+--------+--------+-------------------+--------+ CCA Mid   56      15                                          +----------+--------+--------+--------+-------------------+--------+ CCA Distal46      9                                           +----------+--------+--------+--------+-------------------+--------+ ICA Prox  53      14      1-39%   calcific and smooth         +----------+--------+--------+--------+-------------------+--------+ ICA Mid   48      15                                           +----------+--------+--------+--------+-------------------+--------+ ICA Distal52      16                                          +----------+--------+--------+--------+-------------------+--------+ ECA       64      6                                           +----------+--------+--------+--------+-------------------+--------+ +----------+--------+-------+----------------+-------------------+           PSV cm/sEDV cmsDescribe        Arm Pressure (mmHG) +----------+--------+-------+----------------+-------------------+ [id number]            Multiphasic, WNL                    +----------+--------+-------+----------------+-------------------+ +---------+--------+--+--------+--+---------+ VertebralPSV cm/s59EDV cm/s12Antegrade +---------+--------+--+--------+--+---------+  Left Carotid Findings: +----------+--------+--------+--------+-------------------+--------+           PSV cm/sEDV cm/sStenosisPlaque Description Comments +----------+--------+--------+--------+-------------------+--------+ CCA Prox  42      6                                           +----------+--------+--------+--------+-------------------+--------+ CCA Mid   64      11                                          +----------+--------+--------+--------+-------------------+--------+  CCA Distal47      10                                          +----------+--------+--------+--------+-------------------+--------+ ICA Prox  46      11      1-39%   calcific and smooth         +----------+--------+--------+--------+-------------------+--------+ ICA Mid   46      12                                          +----------+--------+--------+--------+-------------------+--------+ ICA Distal59      11                                          +----------+--------+--------+--------+-------------------+--------+ ECA       52      2                                            +----------+--------+--------+--------+-------------------+--------+ +----------+--------+--------+----------------+-------------------+           PSV cm/sEDV cm/sDescribe        Arm Pressure (mmHG) +----------+--------+--------+----------------+-------------------+ [id number]              Multiphasic, WNL                    +----------+--------+--------+----------------+-------------------+ +---------+--------+--+--------+-+---------+ VertebralPSV cm/s48EDV cm/s9Antegrade +---------+--------+--+--------+-+---------+   Summary: Right Carotid: The extracranial vessels were near-normal with only minimal wall                thickening or plaque. Left Carotid: The extracranial vessels were near-normal with only minimal wall               thickening or plaque. Vertebrals:  Bilateral vertebral arteries demonstrate antegrade flow. Subclavians: Normal flow hemodynamics were seen in bilateral subclavian              arteries. *See table(s) above for measurements and observations.  Electronically signed by Leotis Pain MD on 10/20/2022 at 9:57:06 AM.    Final     Assessment/Plan Mild intermittent asthma in adult without complication Her breathing has suffered recently and she is having a lot of asthma type symptoms that she had had when she was younger.  Given her hoarseness, worsening respiratory symptoms, lingering COVID symptoms, I think a referral to a pulmonologist would be prudent at this point. She is going to be seeing Dr. Duwayne Heck   Essential hypertension, benign blood pressure control important in reducing the progression of atherosclerotic disease. On appropriate oral medications.     Hyperlipidemia lipid control important in reducing the progression of atherosclerotic disease. Continue statin therapy  Carotid stenosis Her carotid duplex recently reveals stable 1 to 39% ICA stenosis bilaterally without significant progression from previous study.   No problem-specific Assessment &  Plan notes found for this encounter.    Leotis Pain, MD  11/07/2022 11:33 AM    This note was created with Dragon medical transcription system.  Any errors from dictation are purely unintentional

## 2022-11-07 NOTE — Telephone Encounter (Signed)
Ok to schedule.

## 2022-11-07 NOTE — Telephone Encounter (Signed)
Patient would like to schedule new patient appointment with Dr. Patsey Berthold.States son is patient of Dr, Patsey Berthold. Patient phone number Is [phone].

## 2022-11-07 NOTE — Telephone Encounter (Signed)
Okay to schedule

## 2022-11-07 NOTE — Telephone Encounter (Signed)
I have spoke with the patient and scheduled her an appt for 3/5 at 9:00am.  Nothing further is needed.

## 2022-11-07 NOTE — Assessment & Plan Note (Signed)
Recent MRA shows no change in her 2 mm right A2 ACA aneurysm.  No large vessel occlusion or acute changes seen.

## 2022-11-14 ENCOUNTER — Ambulatory Visit (INDEPENDENT_AMBULATORY_CARE_PROVIDER_SITE_OTHER): Payer: Medicare Other | Admitting: Pulmonary Disease

## 2022-11-14 ENCOUNTER — Encounter: Payer: Self-pay | Admitting: Pulmonary Disease

## 2022-11-14 VITALS — BP 126/82 | HR 67 | Temp 97.9°F | Ht 59.0 in | Wt 97.8 lb

## 2022-11-14 DIAGNOSIS — J329 Chronic sinusitis, unspecified: Secondary | ICD-10-CM | POA: Diagnosis not present

## 2022-11-14 DIAGNOSIS — R0602 Shortness of breath: Secondary | ICD-10-CM

## 2022-11-14 DIAGNOSIS — J341 Cyst and mucocele of nose and nasal sinus: Secondary | ICD-10-CM

## 2022-11-14 DIAGNOSIS — R053 Chronic cough: Secondary | ICD-10-CM

## 2022-11-14 LAB — NITRIC OXIDE: Nitric Oxide: 17

## 2022-11-14 MED ORDER — BREO ELLIPTA 100-25 MCG/ACT IN AEPB: 1.0000 | INHALATION_SPRAY | Freq: Every day | RESPIRATORY_TRACT | 11 refills | Status: DC

## 2022-11-14 MED ORDER — TRIAMCINOLONE ACETONIDE 55 MCG/ACT NA AERO: 2.0000 | INHALATION_SPRAY | Freq: Every day | NASAL | 6 refills | Status: DC

## 2022-11-14 NOTE — Patient Instructions (Addendum)
We are scheduling breathing tests.  We are giving you a trial of an inhaler called Breo Ellipta, this is 1 puff daily.  Make sure you rinse your mouth well after you use it.  We have sent in a nasal spray to your pharmacy this is very mild and is 1 spray to each nostril twice a day.  We will see you in follow-up in 6 to 8 weeks time call sooner should any new problems arise.

## 2022-11-14 NOTE — Progress Notes (Signed)
Subjective:    Patient ID: Jennifer Roman, female    DOB: [birth year], 89 y.o.   MRN: [id number] Patient Care Team: Maryland Pink, MD as PCP - General (Family Medicine) Richmond Campbell, MD as Consulting Physician (Gastroenterology) Lucky Cowboy Erskine Squibb, MD as Referring Physician (Vascular Surgery)  Chief Complaint  Patient presents with   Consult    Has been told she has Asthma years ago. Drainage since August of last year. Cough with white sputum. Coughing makes her throat close up. Right lowe lobe removed due to cancer. Occasional wheezing.     HPI Jennifer Roman is a 89 year old presents for evaluation of a cough productive of whitish sputum since August 2023.  She is kindly referred by Dr. Leotis Pain, her primary care physician is Dr. Maryland Pink.  The patient's main complaints are dose of sinus drainage that then causes her to have a sensation of a "lump" on her throat (globus sensation).  States that she has tried multiple nasal sprays but these are "too strong for her.  She states that she has seen 2 ENT physicians but that they seemed dismissive of her symptoms.  She tells me that she has a history of asthma in the past.  She has occasional wheezing.  Her postnasal drainage is described as being "constant".  Nothing seems to make it better.  He states that if she reposes her symptoms get somewhat better.  She does note some dyspnea on exertion.  This has been present for a number of years.  She notes that when she had injections to her neck previously this made her symptoms better.  She has not had any chest pain.  No lower extremity edema, no calf tenderness.  History of right lower lobectomy in the past due to lung cancer however, I cannot find records.  This was apparently around 89.  Again, no records to be found no pathology report.  She has had multiple surgeries to correct paraesophageal hernia and continues to have reflux symptoms.  Her extensive medical history has been reviewed.  It is  as noted below.  Review of Systems A 10 point review of systems was performed and it is as noted above otherwise negative.  Past Medical History:  Diagnosis Date   Allergy    Asthma    Cancer ([place])    lung   Colonic polyp    GERD (gastroesophageal reflux disease)    Hyperlipidemia    Hypertension    Osteoporosis    osteoarthritis   Squamous cell carcinoma of skin    R leg, txted in past by Dr. 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   Carotid stenosis 10/16/2017   Right flank pain 03/25/2015   Ovarian mass, right 03/25/2015   Advanced directives, counseling/discussion 06/11/2014  Routine general medical examination at a health care facility 06/09/2013   Sleep disturbance 06/30/2010   OSTEOARTHRITIS 03/10/2010   Hyperlipidemia 02/18/2010   Essential hypertension, benign 02/18/2010   ALLERGIC RHINITIS 02/18/2010   Mild intermittent asthma in adult without complication [id number]   GERD (gastroesophageal reflux disease) 02/18/2010   OSTEOPOROSIS 02/18/2010   Urge urinary incontinence 02/18/2010   LUNG CANCER, HX OF 02/18/2010   COLONIC POLYPS, HX OF 02/18/2010   Family History  Problem Relation Age of Onset   Stroke Father    Pulmonary embolism Father    Social History   Tobacco Use   Smoking status: Never   Smokeless tobacco: Never  Substance Use Topics   Alcohol use: No     Alcohol/week: 0.0 standard drinks of alcohol   Allergies  Allergen Reactions   Promethazine Hcl     Psychotic reaction----tolerates zofran   Lactose Diarrhea   Scopolamine     Rash to pills   Current Meds  Medication Sig   acetaminophen (TYLENOL) 500 MG tablet Take 500 mg by mouth as needed.   Acetylcysteine (NAC PO) Take 1 capsule by mouth daily.   Alpha-Lipoic Acid 300 MG TABS Take 2 tablets by mouth in the morning and at bedtime.   aspirin EC 81 MG tablet Take 81 mg by mouth daily.   Biotin 1 MG CAPS Take by mouth.   Calcium Citrate-Vitamin D 315-5 MG-MCG TABS    famotidine (PEPCID) 40 MG tablet Take 40 mg by mouth daily.   losartan (COZAAR) 100 MG tablet TAKE ONE TABLET BY MOUTH EVERY DAY   Multiple Vitamins-Minerals (MULTIVITAMIN WITH MINERALS) tablet Take 1 tablet by mouth daily.   ondansetron (ZOFRAN-ODT) 4 MG disintegrating tablet DISSOLVE 1 TABLET IN MOUTH EVERY 12 HOURS AS NEEDED FOR NAUSEA OR VOMITING   tolterodine (DETROL LA) 4 MG 24 hr capsule Take 4 mg by mouth daily after supper.   Turmeric (QC TUMERIC COMPLEX PO) Take 1,000 mg by mouth in the morning and at bedtime.   UNABLE TO FIND Nermectin.  '12mg'$  1 capsule daily   vitamin C (ASCORBIC ACID) 250 MG tablet Take 500 mg by mouth daily.   Zinc 50 MG TABS Take 1 tablet by mouth daily.   Immunization History  Administered Date(s) Administered   Influenza Split 05/31/2011, 05/31/2012   Influenza Whole 06/30/2010   Influenza,inj,Quad PF,6+ Mos 06/09/2013, 06/11/2014, 05/03/2015   Influenza-Unspecified 06/11/2014, 05/03/2015, 06/12/2016, 05/23/2017, 05/31/2020   Moderna Sars-Covid-2 Vaccination 09/29/2019, 10/27/2019   Pneumococcal Conjugate-13 06/11/2014, 08/24/2017   Pneumococcal Polysaccharide-23 09/12/2007   Td 05/31/2012   Zoster Recombinat (Shingrix) 02/26/2017, 06/27/2017       Objective:   Physical Exam BP 126/82 (BP Location: Left Arm, Cuff Size: Normal)   Pulse 67   Temp 97.9 F (36.6 C)   Ht '4\' 11"'$   (1.499 m)   Wt 97 lb 12.8 oz (44.4 kg)   SpO2 98%   BMI 19.75 kg/m   SpO2: 98 % O2 Device: None (Room air)  GENERAL: Thin, well-developed woman, no acute distress.  Age-appropriate.  Fully ambulatory. HEAD: Normocephalic, atraumatic.  EYES: Pupils equal, round, reactive to light.  No scleral icterus.  MOUTH: Natural dentition.  Oral mucosa moist.  No thrush NECK: Supple. No thyromegaly. Trachea midline. No JVD.  No adenopathy. PULMONARY: Good air entry bilaterally.  Rare end expiratory wheeze. CARDIOVASCULAR: S1 and S2. Regular rate and rhythm.  ABDOMEN: Scaphoid, otherwise benign. MUSCULOSKELETAL: Prominence of the sternoclavicular joint on the left compared to the right.  No joint deformity, no clubbing, no edema.  NEUROLOGIC: No overt focal deficit, no gait disturbance, speech is fluent. SKIN: Intact,warm,dry. PSYCH: Mood and behavior normal.  Lab Results  Component Value Date   NITRICOXIDE 17 11/14/2022   Representative image from MRI performed 27 October 2022 showing retention cyst on the right maxillary sinus (red arrow) and turbinate edema compromising nasal passages (yellow arrows):     Assessment & Plan:     ICD-10-CM   1. Shortness of breath  R06.02 Pulmonary Function Test [facility] Only   Has history of asthma, wheezing noted today Check PFTs No overt type II inflammation Trial of Breo Ellipta    2. Chronic cough  R05.3 Nitric oxide    Pulmonary Function Test [facility] Only   Upper airway cough syndrome Driven by postnasal drip    3. Chronic rhinosinusitis  J32.9    Nasacort 1 spray to each nostril twice a day    4. Mucous retention cyst of maxillary sinus - right  J34.1    May need reassessment by ENT Patient wishes different ENT group Will reassess on follow-up appointment     Orders Placed This Encounter  Procedures   Nitric oxide   Pulmonary Function Test [facility] Only    Standing Status:   Future    Standing Expiration Date:   11/14/2023    Order Specific  Question:   Full PFT: includes the following: basic spirometry, spirometry pre & post bronchodilator, diffusion capacity (DLCO), lung volumes    Answer:   Full PFT    Order Specific Question:   This test can only be performed at    Answer:   [facility] ordered this encounter  Medications   triamcinolone (NASACORT) 55 MCG/ACT AERO nasal inhaler    Sig: Place 2 sprays into the nose daily.    Dispense:  16.9 mL    Refill:  6   BREO ELLIPTA 100-25 MCG/ACT AEPB    Sig: Inhale 1 puff into the lungs daily.    Dispense:  28 each    Refill:  11   See the patient in follow-up in 6 to 8 weeks time she is to contact us prior to that time should any new difficulties arise.  Renold Don, MD Advanced Bronchoscopy PCCM [facility]    *This note was dictated using voice recognition software/Dragon.  Despite best efforts to proofread, errors can occur which can change the meaning. Any transcriptional errors that result from this process are unintentional and may not be fully corrected at the time of dictation.

## 2022-11-15 ENCOUNTER — Telehealth: Payer: Self-pay | Admitting: Pulmonary Disease

## 2022-11-15 NOTE — Telephone Encounter (Signed)
Pt pharmacy is calling to see if we can send something less cheaper than the [facility]

## 2022-11-15 NOTE — Telephone Encounter (Addendum)
Prior [employer], can you see what a cheaper option is with her insurance? Thank you!

## 2022-11-16 ENCOUNTER — Other Ambulatory Visit (HOSPITAL_COMMUNITY): Payer: Self-pay

## 2022-11-16 NOTE — Telephone Encounter (Signed)
Lm for patient.  

## 2022-11-16 NOTE — Telephone Encounter (Signed)
[facility] is the preferred option for at this time under the patients plan. There may be a deductible/initial phase that has to be met for the co-pay to decrease in cost.

## 2022-11-20 NOTE — Telephone Encounter (Signed)
I notified the patient. She has an appointment with the RN at [facility] tomorrow. She wants to talk to her about the inhaler and the nasal spray. She will call back tomorrow after she talks to the RN and let us know how she wants to proceed.

## 2022-11-20 NOTE — Telephone Encounter (Signed)
Unfortunately she needs to give a trial to these medications to see if they will help her.  We had to go with what her insurance pays.  Options are limited.  She needs to trust what we recommend otherwise I am not going to be able to help her.

## 2022-11-20 NOTE — Telephone Encounter (Signed)
I spoke with the patient. She said the cost of the medication is not the problem. She said 10 years ago a medication she was taking for her Asthma caused her to have thrush really bad. When she read the box of the inhaler (I am assuming it was the [facility] but she did not know which of the 2 medications you gave her it was) she saw the name on the box and it was similar to the one that caused her thrush so she returned it to the pharmacy. The other medication (again I am assuming it is the Nasacort because she does not know the name of it either) she read the insert with a magnifying glass and it said not to take this medication if she was having 3 or more symptoms (She did not tell me what symptoms) and she was having the symptoms so she was to scared to take the medication. When I said I would send the message back to see if there were other medications she could try, she said she did not want to try any other medications. She said since having the severe thrush and part of her lung removed she is scared to take any medications. She said this has been going on since August and now she is not able to go to church because her asthma acts up and worries everyone. I told her I would send the message back and see if you have any suggestions for her.

## 2022-11-22 NOTE — Telephone Encounter (Signed)
Spoke to patient for update.  She would like to proceed with PFT. She is aware that Rodena Piety will contact her to schedule for April once we have April schedule.  She also stated that she would like to hold off on inhaler and nasal spray for now. She will discuss this further with Dr. Patsey Berthold for 12/25/2022.  Routing to Dr. Patsey Berthold as an Juluis Rainier.

## 2022-11-22 NOTE — Telephone Encounter (Signed)
Noted  

## 2022-12-25 ENCOUNTER — Ambulatory Visit: Payer: Medicare Other | Admitting: Pulmonary Disease

## 2023-01-01 ENCOUNTER — Ambulatory Visit (INDEPENDENT_AMBULATORY_CARE_PROVIDER_SITE_OTHER): Payer: Medicare Other | Admitting: Dermatology

## 2023-01-01 VITALS — BP 152/77 | HR 67

## 2023-01-01 DIAGNOSIS — D229 Melanocytic nevi, unspecified: Secondary | ICD-10-CM

## 2023-01-01 DIAGNOSIS — L814 Other melanin hyperpigmentation: Secondary | ICD-10-CM

## 2023-01-01 DIAGNOSIS — Z85828 Personal history of other malignant neoplasm of skin: Secondary | ICD-10-CM

## 2023-01-01 DIAGNOSIS — D1801 Hemangioma of skin and subcutaneous tissue: Secondary | ICD-10-CM

## 2023-01-01 DIAGNOSIS — L578 Other skin changes due to chronic exposure to nonionizing radiation: Secondary | ICD-10-CM | POA: Diagnosis not present

## 2023-01-01 DIAGNOSIS — Z1283 Encounter for screening for malignant neoplasm of skin: Secondary | ICD-10-CM | POA: Diagnosis not present

## 2023-01-01 DIAGNOSIS — D171 Benign lipomatous neoplasm of skin and subcutaneous tissue of trunk: Secondary | ICD-10-CM

## 2023-01-01 DIAGNOSIS — L821 Other seborrheic keratosis: Secondary | ICD-10-CM

## 2023-01-01 DIAGNOSIS — D692 Other nonthrombocytopenic purpura: Secondary | ICD-10-CM

## 2023-01-01 NOTE — Progress Notes (Signed)
Follow-Up Visit   Subjective  Jennifer Roman is a 89 y.o. female who presents for the following: Skin Cancer Screening and Full Body Skin Exam  The patient presents for Total-Body Skin Exam (TBSE) for skin cancer screening and mole check. The patient has spots, moles and lesions to be evaluated, some may be new or changing. Patient has a history of SCC of the left upper forearm and right leg.  She has a spot on her right lower leg that she noticed several months ago, not bothersome.    The following portions of the chart were reviewed this encounter and updated as appropriate: medications, allergies, medical history  Review of Systems:  No other skin or systemic complaints except as noted in HPI or Assessment and Plan.  Objective  Well appearing patient in no apparent distress; mood and affect are within normal limits.  A full examination was performed including scalp, head, eyes, ears, nose, lips, neck, chest, axillae, abdomen, back, buttocks, bilateral upper extremities, bilateral lower extremities, hands, feet, fingers, toes, fingernails, and toenails. All findings within normal limits unless otherwise noted below.   Relevant physical exam findings are noted in the Assessment and Plan.    Assessment & Plan   LENTIGINES, SEBORRHEIC KERATOSES (including right lower leg), HEMANGIOMAS - Benign normal skin lesions - Benign-appearing - Call for any changes  MELANOCYTIC NEVI - Tan-brown and/or pink-flesh-colored symmetric macules and papules - Benign appearing on exam today - Observation - Call clinic for new or changing moles - Recommend daily use of broad spectrum spf 30+ sunscreen to sun-exposed areas.   ACTINIC DAMAGE - Chronic condition, secondary to cumulative UV/sun exposure - diffuse scaly erythematous macules with underlying dyspigmentation - Recommend daily broad spectrum sunscreen SPF 30+ to sun-exposed areas, reapply every 2 hours as needed.  - Staying in the shade  or wearing long sleeves, sun glasses (UVA+UVB protection) and wide brim hats (4-inch brim around the entire circumference of the hat) are also recommended for sun protection.  - Call for new or changing lesions.  SKIN CANCER SCREENING PERFORMED TODAY.  HISTORY OF SQUAMOUS CELL CARCINOMA OF THE SKIN - No evidence of recurrence today of the left upper forearm and right leg - Recommend regular full body skin exams - Recommend daily broad spectrum sunscreen SPF 30+ to sun-exposed areas, reapply every 2 hours as needed.  - Call if any new or changing lesions are noted between office visits  Purpura - Chronic; persistent and recurrent.  Treatable, but not curable. - Violaceous macules and patches - Benign - Related to trauma, age, sun damage and/or use of blood thinners, chronic use of topical and/or oral steroids - Observe - Can use OTC arnica containing moisturizer such as Dermend Bruise Formula if desired - Call for worsening or other concerns  Lipoma  Exam: Subcutaneous rubbery nodule Location: left spinal mid back  Benign-appearing. Exam most consistent with a lipoma. Discussed that a lipoma is a benign fatty growth that can grow over time and sometimes get irritated. Recommend observation if it is not bothersome or changing. Discussed option of ILK injections or surgical excision to remove it if it is growing, symptomatic, or other changes noted. Please call for new or changing lesions so they can be evaluated.  Return in about 1 year (around 01/01/2024) for TBSE, Hx SCC.  Wendee Beavers, CMA, am acting as scribe for Willeen Niece, MD .   Documentation: I have reviewed the above documentation for accuracy and completeness, and I agree  with the above.  Willeen Niece, MD

## 2023-01-01 NOTE — Patient Instructions (Signed)
Due to recent changes in healthcare laws, you may see results of your pathology and/or laboratory studies on MyChart before the doctors have had a chance to review them. We understand that in some cases there may be results that are confusing or concerning to you. Please understand that not all results are received at the same time and often the doctors may need to interpret multiple results in order to provide you with the best plan of care or course of treatment. Therefore, we ask that you please give us 2 business days to thoroughly review all your results before contacting the office for clarification. Should we see a critical lab result, you will be contacted sooner.   If You Need Anything After Your Visit  If you have any questions or concerns for your doctor, please call our main line at [phone] and press option 4 to reach your doctor's medical assistant. If no one answers, please leave a voicemail as directed and we will return your call as soon as possible. Messages left after 4 pm will be answered the following business day.   You may also send us a message via MyChart. We typically respond to MyChart messages within 1-2 business days.  For prescription refills, please ask your pharmacy to contact our office. Our fax number is [phone].  If you have an urgent issue when the clinic is closed that cannot wait until the next business day, you can page your doctor at the number below.    Please note that while we do our best to be available for urgent issues outside of office hours, we are not available 24/7.   If you have an urgent issue and are unable to reach us, you may choose to seek medical care at your doctor's office, retail clinic, urgent care center, or emergency room.  If you have a medical emergency, please immediately call 911 or go to the emergency department.  Pager Numbers  - Dr. Kowalski: [phone]  - Dr. Moye: [phone]  - Dr. Stewart:  [phone]  In the event of inclement weather, please call our main line at [phone] for an update on the status of any delays or closures.  Dermatology Medication Tips: Please keep the boxes that topical medications come in in order to help keep track of the instructions about where and how to use these. Pharmacies typically print the medication instructions only on the boxes and not directly on the medication tubes.   If your medication is too expensive, please contact our office at [phone] option 4 or send us a message through MyChart.   We are unable to tell what your co-pay for medications will be in advance as this is different depending on your insurance coverage. However, we may be able to find a substitute medication at lower cost or fill out paperwork to get insurance to cover a needed medication.   If a prior authorization is required to get your medication covered by your insurance company, please allow us 1-2 business days to complete this process.  Drug prices often vary depending on where the prescription is filled and some pharmacies may offer cheaper prices.  The website www.goodrx.com contains coupons for medications through different pharmacies. The prices here do not account for what the cost may be with help from insurance (it may be cheaper with your insurance), but the website can give you the price if you did not use any insurance.  - You can print the associated coupon and take it with   your prescription to the pharmacy.  - You may also stop by our office during regular business hours and pick up a GoodRx coupon card.  - If you need your prescription sent electronically to a different pharmacy, notify our office through [facility] MyChart or by phone at [phone] option 4.     Si Usted Necesita Algo Despus de Su Visita  Tambin puede enviarnos un mensaje a travs de MyChart. Por lo general respondemos a los mensajes de MyChart en el transcurso de 1 a 2  das hbiles.  Para renovar recetas, por favor pida a su farmacia que se ponga en contacto con nuestra oficina. Nuestro nmero de fax es el [phone].  Si tiene un asunto urgente cuando la clnica est cerrada y que no puede esperar hasta el siguiente da hbil, puede llamar/localizar a su doctor(a) al nmero que aparece a continuacin.   Por favor, tenga en cuenta que aunque hacemos todo lo posible para estar disponibles para asuntos urgentes fuera del horario de oficina, no estamos disponibles las 24 horas del da, los 7 das de la semana.   Si tiene un problema urgente y no puede comunicarse con nosotros, puede optar por buscar atencin mdica  en el consultorio de su doctor(a), en una clnica privada, en un centro de atencin urgente o en una sala de emergencias.  Si tiene una emergencia mdica, por favor llame inmediatamente al 911 o vaya a la sala de emergencias.  Nmeros de bper  - Dr. Kowalski: [phone]  - Dra. Moye: [phone]  - Dra. Stewart: [phone]  En caso de inclemencias del tiempo, por favor llame a nuestra lnea principal al [phone] para una actualizacin sobre el estado de cualquier retraso o cierre.  Consejos para la medicacin en dermatologa: Por favor, guarde las cajas en las que vienen los medicamentos de uso tpico para ayudarle a seguir las instrucciones sobre dnde y cmo usarlos. Las farmacias generalmente imprimen las instrucciones del medicamento slo en las cajas y no directamente en los tubos del medicamento.   Si su medicamento es muy caro, por favor, pngase en contacto con nuestra oficina llamando al [phone] y presione la opcin 4 o envenos un mensaje a travs de MyChart.   No podemos decirle cul ser su copago por los medicamentos por adelantado ya que esto es diferente dependiendo de la cobertura de su seguro. Sin embargo, es posible que podamos encontrar un medicamento sustituto a menor costo o llenar un formulario para que el  seguro cubra el medicamento que se considera necesario.   Si se requiere una autorizacin previa para que su compaa de seguros cubra su medicamento, por favor permtanos de 1 a 2 das hbiles para completar este proceso.  Los precios de los medicamentos varan con frecuencia dependiendo del lugar de dnde se surte la receta y alguna farmacias pueden ofrecer precios ms baratos.  El sitio web www.goodrx.com tiene cupones para medicamentos de diferentes farmacias. Los precios aqu no tienen en cuenta lo que podra costar con la ayuda del seguro (puede ser ms barato con su seguro), pero el sitio web puede darle el precio si no utiliz ningn seguro.  - Puede imprimir el cupn correspondiente y llevarlo con su receta a la farmacia.  - Tambin puede pasar por nuestra oficina durante el horario de atencin regular y recoger una tarjeta de cupones de GoodRx.  - Si necesita que su receta se enve electrnicamente a una farmacia diferente, informe a nuestra oficina a travs de MyChart de    o por telfono llamando al [phone] y presione la opcin 4.  

## 2023-10-15 ENCOUNTER — Other Ambulatory Visit: Payer: Self-pay | Admitting: Student

## 2023-10-15 DIAGNOSIS — R519 Headache, unspecified: Secondary | ICD-10-CM

## 2023-10-15 DIAGNOSIS — I671 Cerebral aneurysm, nonruptured: Secondary | ICD-10-CM

## 2023-10-15 DIAGNOSIS — M5481 Occipital neuralgia: Secondary | ICD-10-CM

## 2023-10-31 ENCOUNTER — Ambulatory Visit
Admission: RE | Admit: 2023-10-31 | Discharge: 2023-10-31 | Disposition: A | Discharge status: Home / Self Care | Payer: Medicare Other | Source: Ambulatory Visit | Attending: Student | Admitting: Student

## 2023-10-31 ENCOUNTER — Other Ambulatory Visit: Payer: Medicare Other

## 2023-10-31 ENCOUNTER — Inpatient Hospital Stay: Admission: RE | Admit: 2023-10-31 | Payer: Medicare Other | Source: Ambulatory Visit

## 2023-10-31 DIAGNOSIS — I671 Cerebral aneurysm, nonruptured: Secondary | ICD-10-CM

## 2023-10-31 DIAGNOSIS — R519 Headache, unspecified: Secondary | ICD-10-CM

## 2023-10-31 DIAGNOSIS — M5481 Occipital neuralgia: Secondary | ICD-10-CM

## 2023-11-06 ENCOUNTER — Ambulatory Visit (INDEPENDENT_AMBULATORY_CARE_PROVIDER_SITE_OTHER): Payer: Medicare Other | Admitting: Vascular Surgery

## 2023-11-20 ENCOUNTER — Ambulatory Visit (INDEPENDENT_AMBULATORY_CARE_PROVIDER_SITE_OTHER): Payer: Medicare Other | Admitting: Vascular Surgery

## 2023-11-20 ENCOUNTER — Encounter (INDEPENDENT_AMBULATORY_CARE_PROVIDER_SITE_OTHER): Payer: Self-pay | Admitting: Vascular Surgery

## 2023-11-20 VITALS — BP 156/73 | HR 71 | Resp 15 | Wt 87.2 lb

## 2023-11-20 DIAGNOSIS — I6523 Occlusion and stenosis of bilateral carotid arteries: Secondary | ICD-10-CM | POA: Diagnosis not present

## 2023-11-20 DIAGNOSIS — I671 Cerebral aneurysm, nonruptured: Secondary | ICD-10-CM

## 2023-11-20 DIAGNOSIS — I10 Essential (primary) hypertension: Secondary | ICD-10-CM | POA: Diagnosis not present

## 2023-11-20 NOTE — Progress Notes (Unsigned)
 MRN : [id number]  Jennifer Roman is a 89 y.o. (06-04-1928) female who presents with chief complaint of  Chief Complaint  Patient presents with   Follow-up    78yr follow up  .  History of Present Illness: Patient returns today in follow up.  Despite her advanced age, she continues to be active and vigorous.  She is very sharp mentally and active physically.  She does not have any focal neurologic symptoms.  She had a CT scan of her head performed for headaches that I have reviewed.  There was no evidence of hemorrhage.  No acute findings were seen.  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  She also has mild carotid disease bilaterally that we have followed for many years.  Current Outpatient Medications  Medication Sig Dispense Refill   acetaminophen (TYLENOL) 500 MG tablet Take 500 mg by mouth as needed.     Alpha-Lipoic Acid 300 MG TABS Take 2 tablets by mouth in the morning and at bedtime.     aspirin EC 81 MG tablet Take 81 mg by mouth daily.     Biotin 1 MG CAPS Take by mouth.     cetirizine (ZYRTEC) 10 MG tablet Take 10 mg by mouth daily.     CREON 12000-38000 units CPEP capsule Take 2 capsules by mouth 3 (three) times daily with meals. Take 2 capsules 3 times a day with meals and 1 capsule with each snack between meals     ipratropium (ATROVENT) 0.06 % nasal spray Place 2 sprays into both nostrils 2 (two) times daily as needed.     losartan (COZAAR) 100 MG tablet TAKE ONE TABLET BY MOUTH EVERY DAY 30 tablet 11   ondansetron (ZOFRAN-ODT) 4 MG disintegrating tablet DISSOLVE 1 TABLET IN MOUTH EVERY 12 HOURS AS NEEDED FOR NAUSEA OR VOMITING 30 tablet 0   tolterodine (DETROL LA) 4 MG 24 hr capsule Take 4 mg by mouth daily after supper.     Turmeric (QC TUMERIC COMPLEX PO) Take 2,000 mg by mouth in the morning and at bedtime.     vitamin C (ASCORBIC ACID) 250 MG tablet Take 500 mg by mouth daily.     Acetylcysteine (NAC PO) Take 1  capsule by mouth daily. (Patient not taking: Reported on 11/20/2023)     BREO ELLIPTA 100-25 MCG/ACT AEPB Inhale 1 puff into the lungs daily. (Patient not taking: Reported on 11/20/2023) 28 each 11   Calcium Citrate-Vitamin D 315-5 MG-MCG TABS  (Patient not taking: Reported on 11/20/2023)     famotidine (PEPCID) 40 MG tablet Take 40 mg by mouth daily. (Patient not taking: Reported on 11/20/2023)     IVERMECTIN PO Take 12 mg by mouth. (Patient not taking: Reported on 01/01/2023)     Multiple Vitamins-Minerals (MULTIVITAMIN WITH MINERALS) tablet Take 1 tablet by mouth daily. (Patient not taking: Reported on 11/20/2023)     triamcinolone (NASACORT) 55 MCG/ACT AERO nasal inhaler Place 2 sprays into the nose daily. (Patient not taking: Reported on 11/20/2023) 16.9 mL 6   Zinc 50 MG TABS Take 1 tablet by mouth daily. (Patient not taking: Reported on 11/20/2023)     No current facility-administered medications for this visit.    Past Medical History:  Diagnosis Date   Allergy    Asthma    Cancer (HCC)    lung   Colonic polyp    GERD (gastroesophageal reflux disease)  Hyperlipidemia    Hypertension    Osteoporosis    osteoarthritis   Squamous cell carcinoma of skin    R leg, txted in past by Dr. Jarold Motto   Squamous cell carcinoma of skin 02/28/2021   L upper forearm, [facility]   Urinary incontinence    Vertigo    chronic    Past Surgical History:  Procedure Laterality Date   APPENDECTOMY     BLADDER REPAIR     CHOLECYSTECTOMY     esophageal hernia  01/2007   repaired x 4   EYE SURGERY     cataract extraction, bilateral   JOINT REPLACEMENT  02/2010   Left THR   JOINT REPLACEMENT  5/12   Right THR--Dr Hooten   VAGINAL HYSTERECTOMY       Social History   Tobacco Use   Smoking status: Former    Current packs/day: 0.25    Average packs/day: 0.3 packs/day for 10.0 years (2.5 ttl pk-yrs)    Types: Cigarettes   Smokeless tobacco: Never  Substance Use Topics   Alcohol use: No     Alcohol/week: 0.0 standard drinks of alcohol   Drug use: No      Family History  Problem Relation Age of Onset   Stroke Father    Pulmonary embolism Father     Allergies  Allergen Reactions   Promethazine Hcl     Psychotic reaction----tolerates zofran   Lactose Diarrhea   Scopolamine     Rash to pills      REVIEW OF SYSTEMS (Negative unless checked)   Constitutional: [] Weight loss  [] Fever  [] Chills Cardiac: [] Chest pain   [] Chest pressure   [] Palpitations   [] Shortness of breath when laying flat   [] Shortness of breath at rest   [x] Shortness of breath with exertion. Vascular:  [] Pain in legs with walking   [] Pain in legs at rest   [] Pain in legs when laying flat   [] Claudication   [] Pain in feet when walking  [] Pain in feet at rest  [] Pain in feet when laying flat   [] History of DVT   [] Phlebitis   [] Swelling in legs   [] Varicose veins   [] Non-healing ulcers Pulmonary:   [] Uses home oxygen   [] Productive cough   [] Hemoptysis   [] Wheeze  [] COPD   [] Asthma Neurologic:  [x] Dizziness  [] Blackouts   [] Seizures   [] History of stroke   [] History of TIA  [] Aphasia   [] Temporary blindness   [] Dysphagia   [] Weakness or numbness in arms   [] Weakness or numbness in legs X positive for severe headache Musculoskeletal:  [x] Arthritis   [] Joint swelling   [] Joint pain   [] Low back pain Hematologic:  [] Easy bruising  [] Easy bleeding   [] Hypercoagulable state   [] Anemic  [] Hepatitis Gastrointestinal:  [] Blood in stool   [] Vomiting blood  [] Gastroesophageal reflux/heartburn   [] Difficulty swallowing. Genitourinary:  [] Chronic kidney disease   [] Difficult urination  [] Frequent urination  [] Burning with urination   [] Blood in urine Skin:  [] Rashes   [] Ulcers   [] Wounds Psychological:  [] History of anxiety   []  History of major depression.  Physical Examination  BP (!) 156/73   Pulse 71   Resp 15   Wt 87 lb 3.2 oz (39.6 kg)   BMI 17.61 kg/m  Gen:  WD/WN, NAD. Appears younger than stated  age. Head: [place]/AT, No temporalis wasting. Ear/Nose/Throat: Hearing grossly intact, nares w/o erythema or drainage Eyes: Conjunctiva clear. Sclera non-icteric Neck: Supple.  Trachea midline Pulmonary:  Good air movement,  no use of accessory muscles.  Cardiac: RRR, no JVD Vascular:  Vessel Right Left  Radial Palpable Palpable                       Musculoskeletal: M/S 5/5 throughout.  No deformity or atrophy. No edema. Neurologic: Sensation grossly intact in extremities.  Symmetrical.  Speech is fluent.  Psychiatric: Judgment intact, Mood & affect appropriate for pt's clinical situation. Dermatologic: No rashes or ulcers noted.  No cellulitis or open wounds.      Labs No results found for this or any previous visit (from the past 2160 hours).  Radiology CT HEAD WO CONTRAST ( ) Result Date: 11/21/2023 CLINICAL DATA:  Chronic daily headache. Pain in the right occipital lobe that worsens with exertion. EXAM: CT HEAD WITHOUT CONTRAST TECHNIQUE: Contiguous axial images were obtained from the base of the skull through the vertex without intravenous contrast. RADIATION DOSE REDUCTION: This exam was performed according to the departmental dose-optimization program which includes automated exposure control, adjustment of the mA and/or kV according to patient size and/or use of iterative reconstruction technique. COMPARISON:  01/26/2021 FINDINGS: Brain: No evidence of acute infarction, hemorrhage, hydrocephalus, extra-axial collection or mass lesion/mass effect. Extensive low-density in the cerebral white matter attributed to chronic small vessel ischemia. Cerebral volume loss without notable progression since 2017. Vascular: Atheromatous calcification. Skull: Normal. Negative for fracture or focal lesion. Sinuses/Orbits: No acute finding. IMPRESSION: Aging brain without acute finding or specific cause for symptoms. Electronically Signed   By: Tiburcio Pea M.D.   On: 11/21/2023 08:22     Assessment/Plan  Carotid stenosis She has had very mild carotid disease we have followed for many years.  Will have her come back later this year with a carotid duplex for follow-up.  Unlikely to be the cause of her headaches.  Essential hypertension, benign blood pressure control important in reducing the progression of atherosclerotic disease. On appropriate oral medications.   Berry aneurysm Has been very small and not bothersome for many years.    Festus Barren, MD  11/22/2023 11:49 AM    This note was created with Dragon medical transcription system.  Any errors from dictation are purely unintentional

## 2023-11-22 NOTE — Assessment & Plan Note (Signed)
 blood pressure control important in reducing the progression of atherosclerotic disease. On appropriate oral medications.

## 2023-11-22 NOTE — Assessment & Plan Note (Signed)
 Has been very small and not bothersome for many years.

## 2023-11-22 NOTE — Assessment & Plan Note (Signed)
 She has had very mild carotid disease we have followed for many years.  Will have her come back later this year with a carotid duplex for follow-up.  Unlikely to be the cause of her headaches.

## 2024-01-01 ENCOUNTER — Ambulatory Visit: Payer: Medicare Other | Admitting: Dermatology

## 2024-02-20 ENCOUNTER — Other Ambulatory Visit: Payer: Self-pay | Admitting: Pulmonary Disease

## 2024-02-20 DIAGNOSIS — J9801 Acute bronchospasm: Secondary | ICD-10-CM

## 2024-02-20 DIAGNOSIS — R062 Wheezing: Secondary | ICD-10-CM

## 2024-02-27 ENCOUNTER — Ambulatory Visit
Admission: RE | Admit: 2024-02-27 | Discharge: 2024-02-27 | Disposition: A | Discharge status: Home / Self Care | Source: Ambulatory Visit | Attending: Pulmonary Disease | Admitting: Pulmonary Disease

## 2024-02-27 DIAGNOSIS — J9801 Acute bronchospasm: Secondary | ICD-10-CM | POA: Diagnosis present

## 2024-02-27 DIAGNOSIS — R062 Wheezing: Secondary | ICD-10-CM | POA: Insufficient documentation

## 2024-02-27 MED ORDER — IOHEXOL 300 MG/ML  SOLN
75.0000 mL | Freq: Once | INTRAMUSCULAR | Status: AC | PRN
Start: 2024-02-27 — End: 2024-02-27
  Administered 2024-02-27: 60 mL via INTRAVENOUS

## 2024-04-15 ENCOUNTER — Other Ambulatory Visit: Payer: Self-pay | Admitting: Family Medicine

## 2024-04-15 DIAGNOSIS — I10 Essential (primary) hypertension: Secondary | ICD-10-CM

## 2024-04-15 DIAGNOSIS — R109 Unspecified abdominal pain: Secondary | ICD-10-CM

## 2024-04-25 ENCOUNTER — Ambulatory Visit
Admission: RE | Admit: 2024-04-25 | Discharge: 2024-04-25 | Disposition: A | Discharge status: Home / Self Care | Source: Ambulatory Visit | Attending: Family Medicine | Admitting: Family Medicine

## 2024-04-25 DIAGNOSIS — R109 Unspecified abdominal pain: Secondary | ICD-10-CM | POA: Diagnosis present

## 2024-04-25 DIAGNOSIS — I10 Essential (primary) hypertension: Secondary | ICD-10-CM | POA: Insufficient documentation

## 2024-04-25 MED ORDER — IOHEXOL 300 MG/ML  SOLN
75.0000 mL | Freq: Once | INTRAMUSCULAR | Status: AC | PRN
  Administered 2024-04-25: 75 mL via INTRAVENOUS

## 2024-05-23 ENCOUNTER — Ambulatory Visit (INDEPENDENT_AMBULATORY_CARE_PROVIDER_SITE_OTHER)

## 2024-05-23 ENCOUNTER — Ambulatory Visit (INDEPENDENT_AMBULATORY_CARE_PROVIDER_SITE_OTHER): Admitting: Vascular Surgery

## 2024-05-23 ENCOUNTER — Encounter (INDEPENDENT_AMBULATORY_CARE_PROVIDER_SITE_OTHER): Payer: Self-pay | Admitting: Vascular Surgery

## 2024-05-23 VITALS — BP 169/69 | HR 91 | Ht 59.0 in | Wt 83.0 lb

## 2024-05-23 DIAGNOSIS — I10 Essential (primary) hypertension: Secondary | ICD-10-CM

## 2024-05-23 DIAGNOSIS — I6523 Occlusion and stenosis of bilateral carotid arteries: Secondary | ICD-10-CM

## 2024-05-23 DIAGNOSIS — I671 Cerebral aneurysm, nonruptured: Secondary | ICD-10-CM

## 2024-05-23 NOTE — Assessment & Plan Note (Signed)
 Has been small and not bothersome for many years.

## 2024-05-23 NOTE — Assessment & Plan Note (Signed)
 blood pressure control important in reducing the progression of atherosclerotic disease. On appropriate oral medications.

## 2024-05-23 NOTE — Progress Notes (Signed)
 MRN : [id number]  Jennifer Roman is a 89 y.o. (10-07-1927) female who presents with chief complaint of  Chief Complaint  Patient presents with   Follow-up    1 follow up  caroid  .  History of Present Illness: Patient returns today in follow up of her carotid disease.  She remains vigorous and active despite her advanced age.  She is very social and still gets out regularly with her friends.  As always, she is in great spirits today and we had a good conversation which I always enjoy.  No focal neurologic deficits. Specifically, the patient denies amaurosis fugax, speech or swallowing difficulties, or arm or leg weakness or numbness.  Carotid duplex shows bilateral 1 to 39% ICA stenosis.  Both vertebral arteries appear to be antegrade.  She does have a known history of basilar artery stenosis but this is not well-seen with duplex.  Current Outpatient Medications  Medication Sig Dispense Refill   acetaminophen (TYLENOL) 500 MG tablet Take 500 mg by mouth as needed.     Acetylcysteine (NAC PO) Take 1 capsule by mouth daily. (Patient not taking: Reported on 05/23/2024)     Alpha-Lipoic Acid 300 MG TABS Take 2 tablets by mouth in the morning and at bedtime.     aspirin EC 81 MG tablet Take 81 mg by mouth daily.     Biotin 1 MG CAPS Take by mouth.     BREO ELLIPTA  100-25 MCG/ACT AEPB Inhale 1 puff into the lungs daily. (Patient not taking: Reported on 05/23/2024) 28 each 11   Calcium  Citrate-Vitamin D 315-5 MG-MCG TABS  (Patient not taking: Reported on 05/23/2024)     cetirizine (ZYRTEC) 10 MG tablet Take 10 mg by mouth daily.     CREON 12000-38000 units CPEP capsule Take 2 capsules by mouth 3 (three) times daily with meals. Take 2 capsules 3 times a day with meals and 1 capsule with each snack between meals     famotidine (PEPCID) 40 MG tablet Take 40 mg by mouth daily. (Patient not taking: Reported on 05/23/2024)     ipratropium (ATROVENT) 0.06 % nasal spray Place 2 sprays into both nostrils 2  (two) times daily as needed.     IVERMECTIN PO Take 12 mg by mouth. (Patient not taking: Reported on 05/23/2024)     losartan  (COZAAR ) 100 MG tablet TAKE ONE TABLET BY MOUTH EVERY DAY 30 tablet 11   Multiple Vitamins-Minerals (MULTIVITAMIN WITH MINERALS) tablet Take 1 tablet by mouth daily. (Patient not taking: Reported on 11/20/2023)     ondansetron  (ZOFRAN -ODT) 4 MG disintegrating tablet DISSOLVE 1 TABLET IN MOUTH EVERY 12 HOURS AS NEEDED FOR NAUSEA OR VOMITING 30 tablet 0   tolterodine  (DETROL  LA) 4 MG 24 hr capsule Take 4 mg by mouth daily after supper.     triamcinolone  (NASACORT ) 55 MCG/ACT AERO nasal inhaler Place 2 sprays into the nose daily. (Patient not taking: Reported on 05/23/2024) 16.9 mL 6   Turmeric (QC TUMERIC COMPLEX PO) Take 2,000 mg by mouth in the morning and at bedtime.     vitamin C (ASCORBIC ACID) 250 MG tablet Take 500 mg by mouth daily.     Zinc 50 MG TABS Take 1 tablet by mouth daily. (Patient not taking: Reported on 05/23/2024)     No current facility-administered medications for this visit.    Past Medical History:  Diagnosis Date   Allergy    Asthma    Cancer (HCC)    lung  Colonic polyp    GERD (gastroesophageal reflux disease)    Hyperlipidemia    Hypertension    Osteoporosis    osteoarthritis   Squamous cell carcinoma of skin    R leg, txted in past by Dr. Jakie   Squamous cell carcinoma of skin 02/28/2021   L upper forearm, [facility]   Urinary incontinence    Vertigo    chronic    Past Surgical History:  Procedure Laterality Date   APPENDECTOMY     BLADDER REPAIR     CHOLECYSTECTOMY     esophageal hernia  01/2007   repaired x 4   EYE SURGERY     cataract extraction, bilateral   JOINT REPLACEMENT  02/2010   Left THR   JOINT REPLACEMENT  5/12   Right THR--Dr Hooten   VAGINAL HYSTERECTOMY       Social History   Tobacco Use   Smoking status: Former    Current packs/day: 0.25    Average packs/day: 0.3 packs/day for 10.0 years (2.5 ttl  pk-yrs)    Types: Cigarettes   Smokeless tobacco: Never  Substance Use Topics   Alcohol use: No    Alcohol/week: 0.0 standard drinks of alcohol   Drug use: No      Family History  Problem Relation Age of Onset   Stroke Father    Pulmonary embolism Father     Allergies  Allergen Reactions   Promethazine Hcl     Psychotic reaction----tolerates zofran    Lactose Diarrhea   Scopolamine     Rash to pills     REVIEW OF SYSTEMS (Negative unless checked)  Constitutional: [x] Weight loss  [] Fever  [] Chills Cardiac: [] Chest pain   [] Chest pressure   [] Palpitations   [] Shortness of breath when laying flat   [] Shortness of breath at rest   [] Shortness of breath with exertion. Vascular:  [] Pain in legs with walking   [] Pain in legs at rest   [] Pain in legs when laying flat   [] Claudication   [] Pain in feet when walking  [] Pain in feet at rest  [] Pain in feet when laying flat   [] History of DVT   [] Phlebitis   [] Swelling in legs   [] Varicose veins   [] Non-healing ulcers Pulmonary:   [] Uses home oxygen   [] Productive cough   [] Hemoptysis   [] Wheeze  [] COPD   [x] Asthma Neurologic:  [] Dizziness  [] Blackouts   [] Seizures   [] History of stroke   [] History of TIA  [] Aphasia   [] Temporary blindness   [] Dysphagia   [] Weakness or numbness in arms   [] Weakness or numbness in legs Musculoskeletal:  [] Arthritis   [] Joint swelling   [] Joint pain   [] Low back pain Hematologic:  [] Easy bruising  [] Easy bleeding   [] Hypercoagulable state   [] Anemic   Gastrointestinal:  [] Blood in stool   [] Vomiting blood  [x] Gastroesophageal reflux/heartburn   [] Abdominal pain Genitourinary:  [] Chronic kidney disease   [] Difficult urination  [] Frequent urination  [] Burning with urination   [] Hematuria Skin:  [] Rashes   [] Ulcers   [] Wounds Psychological:  [] History of anxiety   []  History of major depression.  Physical Examination  BP (!) 169/69   Pulse 91   Ht 4' 11 (1.499 m)   Wt 83 lb (37.6 kg)   BMI 16.76 kg/m   Gen:  WD/WN, NAD. Appears younger than stated age. Head: [place]/AT, No temporalis wasting. Ear/Nose/Throat: Hearing grossly intact, nares w/o erythema or drainage Eyes: Conjunctiva clear. Sclera non-icteric Neck: Supple.  Trachea midline Pulmonary:  Good air movement, no use of accessory muscles.  Cardiac: RRR, no JVD Vascular:  Vessel Right Left  Radial Palpable Palpable               Musculoskeletal: M/S 5/5 throughout.  No deformity or atrophy. No edema. Neurologic: Sensation grossly intact in extremities.  Symmetrical.  Speech is fluent but voice is raspy.  Psychiatric: Judgment intact, Mood & affect appropriate for pt's clinical situation. Dermatologic: No rashes or ulcers noted.  No cellulitis or open wounds.      Labs No results found for this or any previous visit (from the past 2160 hours).  Radiology CT ABDOMEN PELVIS W CONTRAST Result Date: 05/09/2024 CLINICAL DATA:  Essential hypertension, right flank pain, lung cancer * Tracking Code: BO * EXAM: CT ABDOMEN AND PELVIS WITH CONTRAST TECHNIQUE: Multidetector CT imaging of the abdomen and pelvis was performed using the standard protocol following bolus administration of intravenous contrast. RADIATION DOSE REDUCTION: This exam was performed according to the departmental dose-optimization program which includes automated exposure control, adjustment of the mA and/or kV according to patient size and/or use of iterative reconstruction technique. CONTRAST:  75mL OMNIPAQUE  IOHEXOL  300 MG/ML  SOLN COMPARISON:  10/13/2020 FINDINGS: Lower chest: No acute abnormality.  Coronary artery calcifications Hepatobiliary: No focal liver abnormality is seen.  Cholecystectomy. Pancreas: Unremarkable. No pancreatic ductal dilatation or surrounding inflammatory changes. Spleen: Normal in size without significant abnormality. Adrenals/Urinary Tract: Adrenal glands are unremarkable. Mild right hydronephrosis without calculus or other obstruction visible.  This appearance is very similar to prior examination dated 2022. Tiny phleboliths in the right hemipelvis unchanged compared to 2022 which do not reflect urinary tract calculi (series 2, image 68). Evaluation of the bladder very limited by streak artifact from bilateral hip arthroplasty. No obvious abnormality. Stomach/Bowel: Stomach is within normal limits. Appendix not clearly visualized. No evidence of bowel wall thickening, distention, or inflammatory changes. Sigmoid diverticulosis. Vascular/Lymphatic: Aortic atherosclerosis. No enlarged abdominal or pelvic lymph nodes. Reproductive: Evaluation of the low pelvis very limited by streak artifact from bilateral hip arthroplasty. No obvious abnormality. Other: No abdominal wall hernia or abnormality. No ascites. Musculoskeletal: No acute osseous findings. Bilateral hip total arthroplasty with associated metallic streak artifact. Levoscoliosis of the lumbar spine. IMPRESSION: 1. Mild right hydronephrosis without calculus or other obstruction visible. This appearance is very similar to prior examination dated 2022 and of uncertain significance. 2. Evaluation of the distal ureters, bladder, and low pelvis very limited by streak artifact from bilateral hip arthroplasty. No obvious abnormality. 3. Sigmoid diverticulosis without evidence of acute diverticulitis. 4. Coronary artery disease. Aortic Atherosclerosis (ICD10-I70.0). Electronically Signed   By: Marolyn JONETTA Jaksch M.D.   On: 05/09/2024 07:32    Assessment/Plan  Carotid stenosis Carotid duplex shows bilateral 1 to 39% ICA stenosis.  Both vertebral arteries appear to be antegrade.  She does have a known history of basilar artery stenosis but this is not well-seen with duplex.  No role for intervention.  Continue to follow with duplex.  Essential hypertension, benign blood pressure control important in reducing the progression of atherosclerotic disease. On appropriate oral medications.   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    Selinda Gu, MD  05/23/2024 12:39 PM    This note was created with Dragon medical transcription system.  Any errors from dictation are purely unintentional

## 2024-05-23 NOTE — Assessment & Plan Note (Signed)
 Carotid duplex shows bilateral 1 to 39% ICA stenosis.  Both vertebral arteries appear to be antegrade.  She does have a known history of basilar artery stenosis but this is not well-seen with duplex.  No role for intervention.  Continue to follow with duplex.

## 2024-06-09 ENCOUNTER — Emergency Department

## 2024-06-09 ENCOUNTER — Other Ambulatory Visit: Payer: Self-pay

## 2024-06-09 ENCOUNTER — Inpatient Hospital Stay

## 2024-06-09 ENCOUNTER — Inpatient Hospital Stay
Admission: EM | Admit: 2024-06-09 | Discharge: 2024-07-12 | DRG: 388 | Disposition: E | Attending: Internal Medicine | Admitting: Internal Medicine

## 2024-06-09 DIAGNOSIS — E785 Hyperlipidemia, unspecified: Secondary | ICD-10-CM | POA: Diagnosis present

## 2024-06-09 DIAGNOSIS — G9341 Metabolic encephalopathy: Secondary | ICD-10-CM | POA: Diagnosis not present

## 2024-06-09 DIAGNOSIS — E878 Other disorders of electrolyte and fluid balance, not elsewhere classified: Secondary | ICD-10-CM | POA: Diagnosis present

## 2024-06-09 DIAGNOSIS — K8689 Other specified diseases of pancreas: Secondary | ICD-10-CM | POA: Diagnosis present

## 2024-06-09 DIAGNOSIS — R54 Age-related physical debility: Secondary | ICD-10-CM | POA: Diagnosis present

## 2024-06-09 DIAGNOSIS — E875 Hyperkalemia: Secondary | ICD-10-CM | POA: Diagnosis present

## 2024-06-09 DIAGNOSIS — L8990 Pressure ulcer of unspecified site, unspecified stage: Secondary | ICD-10-CM | POA: Insufficient documentation

## 2024-06-09 DIAGNOSIS — Z902 Acquired absence of lung [part of]: Secondary | ICD-10-CM

## 2024-06-09 DIAGNOSIS — L89151 Pressure ulcer of sacral region, stage 1: Secondary | ICD-10-CM | POA: Diagnosis present

## 2024-06-09 DIAGNOSIS — Z96643 Presence of artificial hip joint, bilateral: Secondary | ICD-10-CM | POA: Diagnosis present

## 2024-06-09 DIAGNOSIS — Z823 Family history of stroke: Secondary | ICD-10-CM

## 2024-06-09 DIAGNOSIS — K5652 Intestinal adhesions [bands] with complete obstruction: Principal | ICD-10-CM | POA: Diagnosis present

## 2024-06-09 DIAGNOSIS — Z79899 Other long term (current) drug therapy: Secondary | ICD-10-CM

## 2024-06-09 DIAGNOSIS — E119 Type 2 diabetes mellitus without complications: Secondary | ICD-10-CM | POA: Diagnosis present

## 2024-06-09 DIAGNOSIS — J9601 Acute respiratory failure with hypoxia: Secondary | ICD-10-CM | POA: Diagnosis not present

## 2024-06-09 DIAGNOSIS — E874 Mixed disorder of acid-base balance: Secondary | ICD-10-CM | POA: Diagnosis not present

## 2024-06-09 DIAGNOSIS — J4540 Moderate persistent asthma, uncomplicated: Secondary | ICD-10-CM | POA: Diagnosis present

## 2024-06-09 DIAGNOSIS — Z87891 Personal history of nicotine dependence: Secondary | ICD-10-CM

## 2024-06-09 DIAGNOSIS — N179 Acute kidney failure, unspecified: Secondary | ICD-10-CM | POA: Diagnosis present

## 2024-06-09 DIAGNOSIS — J9602 Acute respiratory failure with hypercapnia: Secondary | ICD-10-CM | POA: Diagnosis not present

## 2024-06-09 DIAGNOSIS — I10 Essential (primary) hypertension: Secondary | ICD-10-CM | POA: Diagnosis present

## 2024-06-09 DIAGNOSIS — Z7982 Long term (current) use of aspirin: Secondary | ICD-10-CM

## 2024-06-09 DIAGNOSIS — K219 Gastro-esophageal reflux disease without esophagitis: Secondary | ICD-10-CM | POA: Diagnosis present

## 2024-06-09 DIAGNOSIS — K56609 Unspecified intestinal obstruction, unspecified as to partial versus complete obstruction: Secondary | ICD-10-CM | POA: Diagnosis not present

## 2024-06-09 DIAGNOSIS — Z9842 Cataract extraction status, left eye: Secondary | ICD-10-CM

## 2024-06-09 DIAGNOSIS — Z681 Body mass index (BMI) 19 or less, adult: Secondary | ICD-10-CM

## 2024-06-09 DIAGNOSIS — E441 Mild protein-calorie malnutrition: Secondary | ICD-10-CM | POA: Diagnosis present

## 2024-06-09 DIAGNOSIS — Z85828 Personal history of other malignant neoplasm of skin: Secondary | ICD-10-CM

## 2024-06-09 DIAGNOSIS — Z90710 Acquired absence of both cervix and uterus: Secondary | ICD-10-CM

## 2024-06-09 DIAGNOSIS — Z515 Encounter for palliative care: Secondary | ICD-10-CM

## 2024-06-09 DIAGNOSIS — Z66 Do not resuscitate: Secondary | ICD-10-CM | POA: Diagnosis present

## 2024-06-09 DIAGNOSIS — Z85118 Personal history of other malignant neoplasm of bronchus and lung: Secondary | ICD-10-CM

## 2024-06-09 DIAGNOSIS — D72829 Elevated white blood cell count, unspecified: Secondary | ICD-10-CM | POA: Diagnosis present

## 2024-06-09 DIAGNOSIS — Z9841 Cataract extraction status, right eye: Secondary | ICD-10-CM

## 2024-06-09 DIAGNOSIS — Z7952 Long term (current) use of systemic steroids: Secondary | ICD-10-CM

## 2024-06-09 DIAGNOSIS — M810 Age-related osteoporosis without current pathological fracture: Secondary | ICD-10-CM | POA: Diagnosis present

## 2024-06-09 DIAGNOSIS — Z9049 Acquired absence of other specified parts of digestive tract: Secondary | ICD-10-CM

## 2024-06-09 DIAGNOSIS — R64 Cachexia: Secondary | ICD-10-CM | POA: Diagnosis present

## 2024-06-09 DIAGNOSIS — Z860100 Personal history of colon polyps, unspecified: Secondary | ICD-10-CM

## 2024-06-09 LAB — BASIC METABOLIC PANEL WITH GFR
Anion gap: 15 (ref 5–15)
BUN: 27 mg/dL — ABNORMAL HIGH (ref 8–23)
CO2: 27 mmol/L (ref 22–32)
Calcium: 9.7 mg/dL (ref 8.9–10.3)
Chloride: 92 mmol/L — ABNORMAL LOW (ref 98–111)
Creatinine, Ser: 1.29 mg/dL — ABNORMAL HIGH (ref 0.44–1.00)
GFR, Estimated: 39 mL/min — ABNORMAL LOW (ref >60)
Glucose, Bld: 184 mg/dL — ABNORMAL HIGH (ref 70–99)
Potassium: 3.6 mmol/L (ref 3.5–5.1)
Sodium: 134 mmol/L — ABNORMAL LOW (ref 135–145)

## 2024-06-09 LAB — COMPREHENSIVE METABOLIC PANEL WITH GFR
ALT: 40 U/L (ref 0–44)
AST: 49 U/L — ABNORMAL HIGH (ref 15–41)
Albumin: 5 g/dL (ref 3.5–5.0)
Alkaline Phosphatase: 70 U/L (ref 38–126)
Anion gap: 17 — ABNORMAL HIGH (ref 5–15)
BUN: 24 mg/dL — ABNORMAL HIGH (ref 8–23)
CO2: 29 mmol/L (ref 22–32)
Calcium: 10.9 mg/dL — ABNORMAL HIGH (ref 8.9–10.3)
Chloride: 89 mmol/L — ABNORMAL LOW (ref 98–111)
Creatinine, Ser: 1.05 mg/dL — ABNORMAL HIGH (ref 0.44–1.00)
GFR, Estimated: 50 mL/min — ABNORMAL LOW (ref >60)
Glucose, Bld: 176 mg/dL — ABNORMAL HIGH (ref 70–99)
Potassium: 5.9 mmol/L — ABNORMAL HIGH (ref 3.5–5.1)
Sodium: 135 mmol/L (ref 135–145)
Total Bilirubin: 1 mg/dL (ref 0.0–1.2)
Total Protein: 8.6 g/dL — ABNORMAL HIGH (ref 6.5–8.1)

## 2024-06-09 LAB — CBC WITH DIFFERENTIAL/PLATELET
Abs Immature Granulocytes: 0.06 K/uL (ref 0.00–0.07)
Basophils Absolute: 0.1 K/uL (ref 0.0–0.1)
Basophils Relative: 0 %
Eosinophils Absolute: 0 K/uL (ref 0.0–0.5)
Eosinophils Relative: 0 %
HCT: 47.3 % — ABNORMAL HIGH (ref 36.0–46.0)
Hemoglobin: 15.5 g/dL — ABNORMAL HIGH (ref 12.0–15.0)
Immature Granulocytes: 0 %
Lymphocytes Relative: 8 %
Lymphs Abs: 1.2 K/uL (ref 0.7–4.0)
MCH: 30.4 pg (ref 26.0–34.0)
MCHC: 32.8 g/dL (ref 30.0–36.0)
MCV: 92.7 fL (ref 80.0–100.0)
Monocytes Absolute: 0.7 K/uL (ref 0.1–1.0)
Monocytes Relative: 4 %
Neutro Abs: 13.1 K/uL — ABNORMAL HIGH (ref 1.7–7.7)
Neutrophils Relative %: 88 %
Platelets: 386 K/uL (ref 150–400)
RBC: 5.1 MIL/uL (ref 3.87–5.11)
RDW: 12.7 % (ref 11.5–15.5)
WBC: 15.1 K/uL — ABNORMAL HIGH (ref 4.0–10.5)
nRBC: 0 % (ref 0.0–0.2)

## 2024-06-09 LAB — HEMOGLOBIN A1C
Hgb A1c MFr Bld: 5.6 % (ref 4.8–5.6)
Mean Plasma Glucose: 114.02 mg/dL

## 2024-06-09 LAB — GLUCOSE, CAPILLARY
Glucose-Capillary: 144 mg/dL — ABNORMAL HIGH (ref 70–99)
Glucose-Capillary: 150 mg/dL — ABNORMAL HIGH (ref 70–99)

## 2024-06-09 LAB — LIPASE, BLOOD: Lipase: 35 U/L (ref 11–51)

## 2024-06-09 LAB — CBG MONITORING, ED: Glucose-Capillary: 221 mg/dL — ABNORMAL HIGH (ref 70–99)

## 2024-06-09 MED ORDER — MORPHINE SULFATE (PF) 2 MG/ML IV SOLN
2.0000 mg | Freq: Once | INTRAVENOUS | Status: AC
  Administered 2024-06-09: 2 mg via INTRAVENOUS
  Filled 2024-06-09: qty 1

## 2024-06-09 MED ORDER — ONDANSETRON HCL 4 MG PO TABS: 4.0000 mg | ORAL_TABLET | Freq: Four times a day (QID) | ORAL | Status: DC | PRN

## 2024-06-09 MED ORDER — OXYCODONE HCL 5 MG PO TABS: 5.0000 mg | ORAL_TABLET | ORAL | Status: DC | PRN

## 2024-06-09 MED ORDER — SODIUM CHLORIDE 0.9 % IV SOLN: INTRAVENOUS | Status: AC

## 2024-06-09 MED ORDER — INSULIN ASPART 100 UNIT/ML IV SOLN
5.0000 [IU] | Freq: Once | INTRAVENOUS | Status: AC
  Administered 2024-06-09: 5 [IU] via INTRAVENOUS
  Filled 2024-06-09: qty 0.05

## 2024-06-09 MED ORDER — ALBUTEROL SULFATE (2.5 MG/3ML) 0.083% IN NEBU
10.0000 mg | INHALATION_SOLUTION | Freq: Once | RESPIRATORY_TRACT | Status: AC
  Administered 2024-06-09: 10 mg via RESPIRATORY_TRACT
  Filled 2024-06-09: qty 12

## 2024-06-09 MED ORDER — ACETAMINOPHEN 325 MG PO TABS: 650.0000 mg | ORAL_TABLET | Freq: Four times a day (QID) | ORAL | Status: DC | PRN

## 2024-06-09 MED ORDER — ONDANSETRON HCL 4 MG/2ML IJ SOLN
4.0000 mg | Freq: Once | INTRAMUSCULAR | Status: AC
  Administered 2024-06-09: 4 mg via INTRAVENOUS
  Filled 2024-06-09: qty 2

## 2024-06-09 MED ORDER — INSULIN ASPART 100 UNIT/ML IJ SOLN: 0.0000 [IU] | Freq: Every day | INTRAMUSCULAR | Status: DC

## 2024-06-09 MED ORDER — IPRATROPIUM-ALBUTEROL 0.5-2.5 (3) MG/3ML IN SOLN
3.0000 mL | Freq: Once | RESPIRATORY_TRACT | Status: AC
  Administered 2024-06-09: 3 mL via RESPIRATORY_TRACT
  Filled 2024-06-09: qty 3

## 2024-06-09 MED ORDER — LABETALOL HCL 5 MG/ML IV SOLN
10.0000 mg | Freq: Once | INTRAVENOUS | Status: DC
  Filled 2024-06-09: qty 4

## 2024-06-09 MED ORDER — DIATRIZOATE MEGLUMINE & SODIUM 66-10 % PO SOLN
90.0000 mL | Freq: Once | ORAL | Status: AC
  Administered 2024-06-09: 90 mL via NASOGASTRIC

## 2024-06-09 MED ORDER — ACETAMINOPHEN 650 MG RE SUPP: 650.0000 mg | Freq: Four times a day (QID) | RECTAL | Status: DC | PRN

## 2024-06-09 MED ORDER — INSULIN ASPART 100 UNIT/ML IJ SOLN
0.0000 [IU] | Freq: Three times a day (TID) | INTRAMUSCULAR | Status: DC
  Administered 2024-06-10: 1 [IU] via SUBCUTANEOUS
  Filled 2024-06-09: qty 1

## 2024-06-09 MED ORDER — CALCIUM GLUCONATE-NACL 1-0.675 GM/50ML-% IV SOLN
1.0000 g | Freq: Once | INTRAVENOUS | Status: AC
  Administered 2024-06-09: 1000 mg via INTRAVENOUS
  Filled 2024-06-09: qty 50

## 2024-06-09 MED ORDER — ENOXAPARIN SODIUM 30 MG/0.3ML IJ SOSY
30.0000 mg | PREFILLED_SYRINGE | INTRAMUSCULAR | Status: DC
  Administered 2024-06-09: 30 mg via SUBCUTANEOUS
  Filled 2024-06-09: qty 0.3

## 2024-06-09 MED ORDER — HYDRALAZINE HCL 20 MG/ML IJ SOLN: 10.0000 mg | Freq: Four times a day (QID) | INTRAMUSCULAR | Status: DC | PRN

## 2024-06-09 MED ORDER — SODIUM POLYSTYRENE SULFONATE 15 GM/60ML CO SUSP
30.0000 g | Freq: Once | Status: DC
Start: 2024-06-09 — End: 2024-06-11
  Filled 2024-06-09: qty 120

## 2024-06-09 MED ORDER — IOHEXOL 300 MG/ML  SOLN
75.0000 mL | Freq: Once | INTRAMUSCULAR | Status: AC | PRN
  Administered 2024-06-09: 75 mL via INTRAVENOUS

## 2024-06-09 MED ORDER — LIDOCAINE HCL URETHRAL/MUCOSAL 2 % EX GEL
1.0000 | Freq: Once | CUTANEOUS | Status: DC
Start: 2024-06-09 — End: 2024-06-13
  Filled 2024-06-09 (×2): qty 10

## 2024-06-09 MED ORDER — DEXTROSE 50 % IV SOLN
1.0000 | Freq: Once | INTRAVENOUS | Status: AC
  Administered 2024-06-09: 50 mL via INTRAVENOUS
  Filled 2024-06-09: qty 50

## 2024-06-09 MED ORDER — ONDANSETRON HCL 4 MG/2ML IJ SOLN
4.0000 mg | Freq: Four times a day (QID) | INTRAMUSCULAR | Status: DC | PRN
  Administered 2024-06-10 – 2024-06-11 (×2): 4 mg via INTRAVENOUS
  Filled 2024-06-09 (×2): qty 2

## 2024-06-09 NOTE — H&P (Addendum)
 History and Physical    Jennifer Roman FMW:[id number] DOB: August 16, 1928 DOA: 06/09/2024  DOS: the patient was seen and examined on 06/09/2024  PCP: Valora Agent, MD   Patient coming from: [facility] independent living facility  I have personally briefly reviewed patient's old medical records in [facility]  Chief Complaint: Abdominal pain  HPI: Jennifer Roman is a pleasant 89 y.o. female with medical history significant for asthma, HTN, HLD, osteoporosis, GERD who presented to ED at [facility] for abdominal pain for several days.  Patient stated that her pain is 5/10 in intensity, radiating to right mid back.  This pain started getting worse last night to the point that she was not able to sleep.  She complains some nausea but no vomiting.  She said she had a very big bowel movement yesterday and then she started feeling bloating after that.   She denies any fever, chills, dysuria, hematuria, cough, shortness of breath, palpitations.  ED Course: Upon arrival to the ED, patient is found to have fluid-filled dilated small bowel loops in the CT scan of the abdomen.  General surgery was consulted for evaluation.  General surgery evaluated the patient for small bowel obstruction and they decided to treat nonsurgically.  They requested admission to medical team.  Hospitalist service was consulted for evaluation for admission.  Review of Systems:  ROS  All other systems negative except as noted in the HPI.  Past Medical History:  Diagnosis Date   Allergy    Asthma    Cancer (HCC)    lung   Colonic polyp    GERD (gastroesophageal reflux disease)    Hyperlipidemia    Hypertension    Osteoporosis    osteoarthritis   Squamous cell carcinoma of skin    R leg, txted in past by Dr. Jakie   Squamous cell carcinoma of skin 02/28/2021   L upper forearm, [facility]   Urinary incontinence    Vertigo    chronic    Past Surgical History:  Procedure Laterality Date   APPENDECTOMY     BLADDER  REPAIR     CHOLECYSTECTOMY     esophageal hernia  01/2007   repaired x 4   EYE SURGERY     cataract extraction, bilateral   JOINT REPLACEMENT  02/2010   Left THR   JOINT REPLACEMENT  5/12   Right THR--Dr Hooten   VAGINAL HYSTERECTOMY       reports that she has quit smoking. Her smoking use included cigarettes. She has a 2.5 pack-year smoking history. She has never used smokeless tobacco. She reports that she does not drink alcohol and does not use drugs.  Allergies  Allergen Reactions   Promethazine Other (See Comments)    promethazine hydrochloride   Promethazine Hcl     Psychotic reaction----tolerates zofran    Lactose Diarrhea   Scopolamine     Rash to pills    Family History  Problem Relation Age of Onset   Stroke Father    Pulmonary embolism Father     Prior to Admission medications   Medication Sig Start Date End Date Taking? Authorizing Provider  acetaminophen (TYLENOL) 500 MG tablet Take 500 mg by mouth as needed.    [provider]  Acetylcysteine (NAC PO) Take 1 capsule by mouth daily. Patient not taking: Reported on 05/23/2024    [provider]  Alpha-Lipoic Acid 300 MG TABS Take 2 tablets by mouth in the morning and at bedtime.    [provider]  aspirin EC 81 MG tablet Take 81 mg by mouth daily.    [provider]  Biotin 1 MG CAPS Take by mouth.    [provider]  BREO ELLIPTA  100-25 MCG/ACT AEPB Inhale 1 puff into the lungs daily. Patient not taking: Reported on 05/23/2024 11/14/22   Tamea Dedra CROME, MD  Calcium  Citrate-Vitamin D 315-5 MG-MCG TABS  08/12/21   [provider]  cetirizine (ZYRTEC) 10 MG tablet Take 10 mg by mouth daily.    [provider]  CREON 12000-38000 units CPEP capsule Take 2 capsules by mouth 3 (three) times daily with meals. Take 2 capsules 3 times a day with meals and 1 capsule with each snack between meals    [provider]  famotidine (PEPCID) 40 MG tablet  Take 40 mg by mouth daily. Patient not taking: Reported on 05/23/2024    [provider]  ipratropium (ATROVENT) 0.06 % nasal spray Place 2 sprays into both nostrils 2 (two) times daily as needed. 10/25/23 10/24/24  [provider]  IVERMECTIN PO Take 12 mg by mouth. Patient not taking: Reported on 05/23/2024    [provider]  losartan  (COZAAR ) 100 MG tablet TAKE ONE TABLET BY MOUTH EVERY DAY 04/27/15   Jimmy Charlie FERNS, MD  Multiple Vitamins-Minerals (MULTIVITAMIN WITH MINERALS) tablet Take 1 tablet by mouth daily. Patient not taking: Reported on 11/20/2023    [provider]  ondansetron  (ZOFRAN -ODT) 4 MG disintegrating tablet DISSOLVE 1 TABLET IN MOUTH EVERY 12 HOURS AS NEEDED FOR NAUSEA OR VOMITING 03/29/15   Jimmy Charlie FERNS, MD  tolterodine  (DETROL  LA) 4 MG 24 hr capsule Take 4 mg by mouth daily after supper.    [provider]  triamcinolone  (NASACORT ) 55 MCG/ACT AERO nasal inhaler Place 2 sprays into the nose daily. Patient not taking: Reported on 05/23/2024 11/14/22   Tamea Dedra CROME, MD  Turmeric (QC TUMERIC COMPLEX PO) Take 2,000 mg by mouth in the morning and at bedtime.    [provider]  vitamin C (ASCORBIC ACID) 250 MG tablet Take 500 mg by mouth daily.    [provider]  Zinc 50 MG TABS Take 1 tablet by mouth daily. Patient not taking: Reported on 05/23/2024    [provider]    Physical Exam: Vitals:   06/09/24 0930 06/09/24 1105 06/09/24 1154 06/09/24 1230  BP: 122/60 111/75  110/63  Pulse: 82 (!) 57  (!) 109  Resp: 17 11  19   Temp:   98.1 F (36.7 C)   TempSrc:   Oral   SpO2: 97% 100%  98%  Weight:      Height:        Physical Exam   Constitutional: Alert, awake, calm, comfortable HEENT: Neck supple Respiratory: Clear to auscultation B/L, she has a loud wheezing like sound Cardiovascular: Regular rate and rhythm, no murmurs / rubs / gallops. No extremity edema. 2+ pedal pulses. No carotid  bruits.  Abdomen: Soft, mild tenderness, Bowel sounds positive.  Musculoskeletal: no clubbing / cyanosis. Good ROM, no contractures. Normal muscle tone.  Skin: no rashes, lesions, ulcers. Neurologic: CN 2-12 grossly intact. Sensation intact, No focal deficit identified Psychiatric: Alert and oriented x 3. Normal mood.    Labs on Admission: I have personally reviewed following labs and imaging studies  CBC: Recent Labs  Lab 06/09/24 0804  WBC 15.1*  NEUTROABS 13.1*  HGB 15.5*  HCT 47.3*  MCV 92.7  PLT 386   Basic Metabolic  Panel: Recent Labs  Lab 06/09/24 0804  NA 135  K 5.9*  CL 89*  CO2 29  GLUCOSE 176*  BUN 24*  CREATININE 1.05*  CALCIUM  10.9*   GFR: Estimated Creatinine Clearance: 19.6 mL/min (A) (by C-G formula based on SCr of 1.05 mg/dL (H)). Liver Function Tests: Recent Labs  Lab 06/09/24 0804  AST 49*  ALT 40  ALKPHOS 70  BILITOT 1.0  PROT 8.6*  ALBUMIN 5.0   Recent Labs  Lab 06/09/24 0804  LIPASE 35   No results for input(s): AMMONIA in the last 168 hours. Coagulation Profile: No results for input(s): INR, PROTIME in the last 168 hours. Cardiac Enzymes: No results for input(s): CKTOTAL, CKMB, CKMBINDEX, TROPONINI, TROPONINIHS in the last 168 hours. BNP (last 3 results) No results for input(s): BNP in the last 8760 hours. HbA1C: No results for input(s): HGBA1C in the last 72 hours. CBG: Recent Labs  Lab 06/09/24 1251  GLUCAP 221*   Lipid Profile: No results for input(s): CHOL, HDL, LDLCALC, TRIG, CHOLHDL, LDLDIRECT in the last 72 hours. Thyroid Function Tests: No results for input(s): TSH, T4TOTAL, FREET4, T3FREE, THYROIDAB in the last 72 hours. Anemia Panel: No results for input(s): VITAMINB12, FOLATE, FERRITIN, TIBC, IRON, RETICCTPCT in the last 72 hours. Urine analysis: No results found for: COLORURINE, APPEARANCEUR, LABSPEC, PHURINE, GLUCOSEU, HGBUR, BILIRUBINUR,  KETONESUR, PROTEINUR, UROBILINOGEN, NITRITE, LEUKOCYTESUR  Radiological Exams on Admission: I have personally reviewed images CT ABDOMEN PELVIS W CONTRAST Result Date: 06/09/2024 CLINICAL DATA:  Abdominal and back pain since yesterday after sitting on middle bleacher for extended period of time. History of lung cancer. EXAM: CT ABDOMEN AND PELVIS WITH CONTRAST TECHNIQUE: Multidetector CT imaging of the abdomen and pelvis was performed using the standard protocol following bolus administration of intravenous contrast. RADIATION DOSE REDUCTION: This exam was performed according to the departmental dose-optimization program which includes automated exposure control, adjustment of the mA and/or kV according to patient size and/or use of iterative reconstruction technique. CONTRAST:  75mL OMNIPAQUE  IOHEXOL  300 MG/ML  SOLN COMPARISON:  04/25/2024 and 10/13/2020 FINDINGS: Lower chest: Lung bases demonstrate no acute airspace process or effusion. Small calcified granuloma left lower lobe. Heart is normal size. There is calcified plaque over the descending thoracic aorta. Air-fluid level over the distal esophagus which may be due to dysmotility versus reflux. Hepatobiliary: Liver is unremarkable without focal mass. Previous cholecystectomy. Biliary tree is unremarkable. Pancreas: Normal. Spleen: Normal. Adrenals/Urinary Tract: Adrenal glands are normal. Kidneys are normal in size without hydronephrosis or nephrolithiasis. Visualized portions of the ureters and bladder are unremarkable although much of the distal ureters and bladder obscured by streak artifact from bilateral hip prostheses. Stomach/Bowel: Moderate distention of the stomach with air and fluid. Previous appendectomy. There are numerous fluid-filled dilated small bowel loops present measuring up to of 4.4 cm in diameter. No dilated loops could be followed distally to the right lower quadrant/right pelvis and cannot follow up beyond that point. No  clear transition point is noted. No free peritoneal air or pneumatosis. Colon is decompressed. Diverticulosis of the sigmoid colon without active inflammation. Vascular/Lymphatic: Moderate calcified plaque over the abdominal aorta which is normal caliber. SMA appears patent. No definite adenopathy. Reproductive: Status post hysterectomy. No adnexal masses. Other: No significant free fluid. Musculoskeletal: Bilateral hip arthroplasties causing moderate streak artifact over the pelvis. Moderate biphasic curvature of the thoracolumbar spine. Moderate degenerative changes throughout the spine with multilevel disc disease over the lumbar spine. Grade 1 anterolisthesis of L3 on L4. IMPRESSION: 1. Numerous  fluid-filled dilated small bowel loops measuring up to 4.4 cm in diameter. No clear transition point is noted. Findings are compatible with distal small bowel obstruction. No free peritoneal air or pneumatosis. 2. Sigmoid diverticulosis without active inflammation. 3. Aortic atherosclerosis. 4. Air-fluid level over the distal esophagus which may be due to dysmotility versus reflux. Aortic Atherosclerosis (ICD10-I70.0). Electronically Signed   By: Toribio Agreste M.D.   On: 06/09/2024 10:11   DG Hip Unilat W or Wo Pelvis 2-3 Views Right Result Date: 06/09/2024 CLINICAL DATA:  Right hip pain. EXAM: DG HIP (WITH OR WITHOUT PELVIS) 2-3V RIGHT COMPARISON:  04/25/2024. FINDINGS: Diffuse osseous demineralization. Status post bilateral total hip arthroplasties with normal alignment. No acute fracture or dislocation. Dilated gas-filled loops of small bowel are noted in the central abdomen. IMPRESSION: 1. No acute osseous abnormality. 2. Dilated gas-filled loops of small bowel are noted in the central abdomen. Obstruction cannot be excluded. Electronically Signed   By: Harrietta Sherry M.D.   On: 06/09/2024 09:09   DG Chest 2 View Result Date: 06/09/2024 CLINICAL DATA:  Shortness of breath. EXAM: CHEST - 2 VIEW COMPARISON:  CT  chest dated 02/27/2024. FINDINGS: The heart size and mediastinal contours are within normal limits. Aortic atherosclerosis. No pulmonary edema, focal consolidation, pleural effusion, or pneumothorax. Moderate gaseous distention of the stomach. No acute osseous abnormality. Scoliotic curvature of the spine with multilevel degenerative changes. Advanced arthritic changes of the right glenohumeral joint. IMPRESSION: 1. No acute cardiopulmonary findings. 2. Moderate gaseous distention of the stomach. 3. Aortic Atherosclerosis (ICD10-I70.0). Electronically Signed   By: Harrietta Sherry M.D.   On: 06/09/2024 09:06    EKG: My personal interpretation of EKG shows:     Assessment/Plan Principal Problem:   SBO (small bowel obstruction) (HCC) Active Problems:   Hyperlipidemia   Essential hypertension, benign   GERD (gastroesophageal reflux disease)   Osteoporosis    Assessment and Plan: 89 year old female with history of hypertension, HLD, GERD, osteoporosis, asthma with audible wheezing who came into ED complaining of abdominal discomfort.  1.  Small bowel obstruction - She will be admitted to hospital as inpatient - Appreciate surgical team recommendation. - N.p.o., IV fluid, pain medications, NG tube per surgical recommendation  2.  Hyperkalemia at 5.9 - EKG does not show any tented T waves - She has a mild AKI, IV fluid will help this - We can give her Kayexalate enema - Monitor potassium level  3.  Leukocytosis - There is no obvious source of infection - Will get UA, chest x-ray negative for acute pathology - Will do a set of blood culture - No antibiotics at this point  4.  GERD - Continue Protonix  5.  HTN - She will be on hydralazine as needed for systolic blood pressure more than 160 as she is not able to take oral medications  6.  Diabetes - She will be placed on insulin sliding scale     DVT prophylaxis: Lovenox Code Status: DNR/DNI(Do NOT Intubate) Family  Communication: Caregiver was at bedside Disposition Plan: Back to independent living facility Consults called: Surgical Admission status: Inpatient, Telemetry bed   Nena Rebel, MD [facility] 06/09/2024, 1:41 PM

## 2024-06-09 NOTE — ED Provider Notes (Addendum)
 [facility] Provider Note    Event Date/Time   First MD Initiated Contact with Patient 06/09/24 [phone]     (approximate)   History   Abdominal Pain and Back Pain   HPI  Jennifer Roman is a 89 year old female with history of HTN, asthma presenting to the ER for multiple complaints.  Patient reports that she was sitting on a hard seat/bleacher watching a game yesterday for an extended period of time.  Since that time she has had worsened pain of her right back with pain over her right leg.  Denies any falls.  Reports she normally can ambulate independently without a walker, but has had pain when attempting to do so today.  She additionally reports that she has had abdominal pain, unsure how long this has been going on.  Thinks it may be related to constipation.  Additionally notes that her breathing seems to echo which she initially states is not abnormal for her, but then later states that her breathing is bothering her.  No reported chest pain.  Reviewed primary care visit from 04/16/2024.  Was continuing to report wheezing at that time as well as right groin pain.  Reviewed pulmonary visit from 03/05/2024, noted to be on chronic steroids at that time, started on Dupixent given refractory asthma symptoms.    Physical Exam   Triage Vital Signs: ED Triage Vitals  Encounter Vitals Group     BP      Girls Systolic BP Percentile      Girls Diastolic BP Percentile      Boys Systolic BP Percentile      Boys Diastolic BP Percentile      Pulse      Resp      Temp      Temp src      SpO2      Weight      Height      Head Circumference      Peak Flow      Pain Score      Pain Loc      Pain Education      Exclude from Growth Chart     Most recent vital signs: Vitals:   06/09/24 0930 06/09/24 1105  BP: 122/60 111/75  Pulse: 82 (!) 57  Resp: 17 11  Temp:    SpO2: 97% 100%     General: Awake, interactive  CV:  Regular rate, good peripheral  perfusion.  Resp:  Noisy respirations notably with inspiration but not high pitched or consistent with stridor, without frank wheezing on lung auscultation, family notes present over the last month Abd:  Fullness over lower abdomen but not tense, tender to palpation more notably over the lower abdomen.  Neuro:  Symmetric facial movement, fluid speech MSK:  Freely ranging LLE and bilateral upper extremities. Able to lift RLE antigravity and bend knee but has limited range of motion at the right hip due to pain   ED Results / Procedures / Treatments   Labs (all labs ordered are listed, but only abnormal results are displayed) Labs Reviewed  CBC WITH DIFFERENTIAL/PLATELET - Abnormal; Notable for the following components:      Result Value   WBC 15.1 (*)    Hemoglobin 15.5 (*)    HCT 47.3 (*)    Neutro Abs 13.1 (*)    All other components within normal limits  COMPREHENSIVE METABOLIC PANEL WITH GFR - Abnormal; Notable for the following components:   Potassium  5.9 (*)    Chloride 89 (*)    Glucose, Bld 176 (*)    BUN 24 (*)    Creatinine, Ser 1.05 (*)    Calcium  10.9 (*)    Total Protein 8.6 (*)    AST 49 (*)    GFR, Estimated 50 (*)    Anion gap 17 (*)    All other components within normal limits  LIPASE, BLOOD  URINALYSIS, W/ REFLEX TO CULTURE (INFECTION SUSPECTED)     EKG EKG independently reviewed and interpreted by myself demonstrates:  EKG demonstrates sinus rhythm at a rate of 92, PR 149, QRS 136, QTc 484, P waves most appreciable in lead II rhythm strip, some irregularity noted  RADIOLOGY Imaging independently reviewed and interpreted by myself demonstrates:  CT abdomen pelvis concerning for SBO without clear transition point Hip x-Michon Kaczmarek without acute bony abnormality Chest x-Arseniy Toomey without focal consolidation  Formal Radiology Read:  CT ABDOMEN PELVIS W CONTRAST Result Date: 06/09/2024 CLINICAL DATA:  Abdominal and back pain since yesterday after sitting on middle  bleacher for extended period of time. History of lung cancer. EXAM: CT ABDOMEN AND PELVIS WITH CONTRAST TECHNIQUE: Multidetector CT imaging of the abdomen and pelvis was performed using the standard protocol following bolus administration of intravenous contrast. RADIATION DOSE REDUCTION: This exam was performed according to the departmental dose-optimization program which includes automated exposure control, adjustment of the mA and/or kV according to patient size and/or use of iterative reconstruction technique. CONTRAST:  75mL OMNIPAQUE  IOHEXOL  300 MG/ML  SOLN COMPARISON:  04/25/2024 and 10/13/2020 FINDINGS: Lower chest: Lung bases demonstrate no acute airspace process or effusion. Small calcified granuloma left lower lobe. Heart is normal size. There is calcified plaque over the descending thoracic aorta. Air-fluid level over the distal esophagus which may be due to dysmotility versus reflux. Hepatobiliary: Liver is unremarkable without focal mass. Previous cholecystectomy. Biliary tree is unremarkable. Pancreas: Normal. Spleen: Normal. Adrenals/Urinary Tract: Adrenal glands are normal. Kidneys are normal in size without hydronephrosis or nephrolithiasis. Visualized portions of the ureters and bladder are unremarkable although much of the distal ureters and bladder obscured by streak artifact from bilateral hip prostheses. Stomach/Bowel: Moderate distention of the stomach with air and fluid. Previous appendectomy. There are numerous fluid-filled dilated small bowel loops present measuring up to of 4.4 cm in diameter. No dilated loops could be followed distally to the right lower quadrant/right pelvis and cannot follow up beyond that point. No clear transition point is noted. No free peritoneal air or pneumatosis. Colon is decompressed. Diverticulosis of the sigmoid colon without active inflammation. Vascular/Lymphatic: Moderate calcified plaque over the abdominal aorta which is normal caliber. SMA appears  patent. No definite adenopathy. Reproductive: Status post hysterectomy. No adnexal masses. Other: No significant free fluid. Musculoskeletal: Bilateral hip arthroplasties causing moderate streak artifact over the pelvis. Moderate biphasic curvature of the thoracolumbar spine. Moderate degenerative changes throughout the spine with multilevel disc disease over the lumbar spine. Grade 1 anterolisthesis of L3 on L4. IMPRESSION: 1. Numerous fluid-filled dilated small bowel loops measuring up to 4.4 cm in diameter. No clear transition point is noted. Findings are compatible with distal small bowel obstruction. No free peritoneal air or pneumatosis. 2. Sigmoid diverticulosis without active inflammation. 3. Aortic atherosclerosis. 4. Air-fluid level over the distal esophagus which may be due to dysmotility versus reflux. Aortic Atherosclerosis (ICD10-I70.0). Electronically Signed   By: Toribio Agreste M.D.   On: 06/09/2024 10:11   DG Hip Unilat W or Wo Pelvis 2-3 Views Right Result  Date: 06/09/2024 CLINICAL DATA:  Right hip pain. EXAM: DG HIP (WITH OR WITHOUT PELVIS) 2-3V RIGHT COMPARISON:  04/25/2024. FINDINGS: Diffuse osseous demineralization. Status post bilateral total hip arthroplasties with normal alignment. No acute fracture or dislocation. Dilated gas-filled loops of small bowel are noted in the central abdomen. IMPRESSION: 1. No acute osseous abnormality. 2. Dilated gas-filled loops of small bowel are noted in the central abdomen. Obstruction cannot be excluded. Electronically Signed   By: Harrietta Sherry M.D.   On: 06/09/2024 09:09   DG Chest 2 View Result Date: 06/09/2024 CLINICAL DATA:  Shortness of breath. EXAM: CHEST - 2 VIEW COMPARISON:  CT chest dated 02/27/2024. FINDINGS: The heart size and mediastinal contours are within normal limits. Aortic atherosclerosis. No pulmonary edema, focal consolidation, pleural effusion, or pneumothorax. Moderate gaseous distention of the stomach. No acute osseous  abnormality. Scoliotic curvature of the spine with multilevel degenerative changes. Advanced arthritic changes of the right glenohumeral joint. IMPRESSION: 1. No acute cardiopulmonary findings. 2. Moderate gaseous distention of the stomach. 3. Aortic Atherosclerosis (ICD10-I70.0). Electronically Signed   By: Harrietta Sherry M.D.   On: 06/09/2024 09:06    PROCEDURES:  Critical Care performed: Yes, see critical care procedure note(s)  CRITICAL CARE Performed by: Nilsa Dade   Total critical care time: 32 minutes  Critical care time was exclusive of separately billable procedures and treating other patients.  Critical care was necessary to treat or prevent imminent or life-threatening deterioration.  Critical care was time spent personally by me on the following activities: development of treatment plan with patient and/or surrogate as well as nursing, discussions with consultants, evaluation of patient's response to treatment, examination of patient, obtaining history from patient or surrogate, ordering and performing treatments and interventions, ordering and review of laboratory studies, ordering and review of radiographic studies, pulse oximetry and re-evaluation of patient's condition.   Procedures   MEDICATIONS ORDERED IN ED: Medications  labetalol (NORMODYNE) injection 10 mg (has no administration in time range)  ipratropium-albuterol  (DUONEB) 0.5-2.5 (3) MG/3ML nebulizer solution 3 mL (3 mLs Nebulization Given 06/09/24 0836)  morphine  (PF) 2 MG/ML injection 2 mg (2 mg Intravenous Given 06/09/24 0835)  ondansetron  (ZOFRAN ) injection 4 mg (4 mg Intravenous Given 06/09/24 0834)  iohexol  (OMNIPAQUE ) 300 MG/ML solution 75 mL (75 mLs Intravenous Contrast Given 06/09/24 0910)  insulin aspart (novoLOG) injection 5 Units (5 Units Intravenous Given 06/09/24 1050)    And  dextrose 50 % solution 50 mL (50 mLs Intravenous Given 06/09/24 1049)  albuterol  (PROVENTIL ) (2.5 MG/3ML) 0.083% nebulizer  solution 10 mg (10 mg Nebulization Given 06/09/24 1055)  calcium  gluconate 1 g/ 50 mL sodium chloride IVPB (0 mg Intravenous Stopped 06/09/24 1132)     IMPRESSION / MDM / ASSESSMENT AND PLAN / ED COURSE  I reviewed the triage vital signs and the nursing notes.  Differential diagnosis includes, but is not limited to, regarding back pain consideration for musculoskeletal strain, fracture from unidentified trauma, regarding abdominal pain consideration for bowel obstruction, colitis, diverticulitis, other acute intra-abdominal process, regarding breathing consideration for asthma exacerbation, pneumonia  Patient's presentation is most consistent with acute presentation with potential threat to life or bodily function.  89 year old female presenting with multiple complaints.  Will obtain labs, chest x-Zakiyah Diop, CT abdomen pelvis, and right hip x-Shanessa Hodak to further evaluate.  Labs with leukocytosis WBC 15, elevated hemoglobin possibly reflective of hemoconcentration. CMP with stable renal impairment, mild hyperkalemia with K of 5.9.  EKG without obvious peaked T waves, does have new bifascicular block,  but no bradycardia.  Will order for temporizing treatment.  With SBO as below, will defer on oral Lokelma.Imaging demonstrates dilated small bowel loops concerning for distal small bowel obstruction though transition point is not clearly identified.  Will discuss with general surgery.  Clinical Course as of 06/09/24 1602  Mon Jun 09, 2024  1045 Case reviewed with PA Solis with general surgery.  She will evaluate the patient and provide additional recommendations. [NR]  1131 Received update from [facility] who had evaluated patient at bedside.  Recommends NG tube and hospitalist team consult.  Will reach out to hospitalist team. [NR]  1148 Case discussed with hospitalist team.  They will evaluate for anticipated admission. [NR]    Clinical Course User Index [NR] Levander Slate, MD        FINAL CLINICAL  IMPRESSION(S) / ED DIAGNOSES   Final diagnoses:  Small bowel obstruction (HCC)  Hyperkalemia     Rx / DC Orders   ED Discharge Orders     None        Note:  This document was prepared using Dragon voice recognition software and may include unintentional dictation errors.   Levander Slate, MD 06/09/24 1150    Levander Slate, MD 06/09/24 [phone]

## 2024-06-09 NOTE — Plan of Care (Signed)

## 2024-06-09 NOTE — ED Triage Notes (Signed)
 Pt presents to the ED via [facility] from [facility]. Pt reports back pain and abdominal pain since yesterday afternoon after sitting on a metal bleacher for an extending amount of time at a sports game. Pt reports her abdominal pain is being caused by constipation with last BM being yesterday afternoon. Pt reports that the back pain is shooting down bilateral legs. States that she can normally ambulate without assistance, but is having difficulty due to pain. Pt still drives at baseline.  A&Ox4 BP 150/78 HR 74 O2 saturation 98% RA  Pt has a hx of left lower lobectomy.

## 2024-06-09 NOTE — ED Notes (Signed)
 Patient transported to X-ray

## 2024-06-09 NOTE — ED Notes (Signed)
 Called CCMD to place pt on central monitoring

## 2024-06-09 NOTE — ED Notes (Signed)
 One unsuccessful attempt at NG insertion by Noemi, RN. Ray, MD to bedside for attempt. Unsuccessful at this time.

## 2024-06-09 NOTE — Consult Note (Signed)
 [facility]-General Surgery  SURGICAL CONSULTATION NOTE    HISTORY OF PRESENT ILLNESS (HPI):  89 y.o. female presented to [facility] ED with multiple complaints, one being abdominal pain.  Patient has been experiencing abdominal pain for several days, radiating to right mid back.  Pain intensified last night, and she could not sleep. She has also been experiencing some nausea, no vomiting.  No known alleviating or aggravating factors.  Reports her last bowel movement was the night before last. Has not passed gas since then. She thought her symptoms were related to constipation. Patient has an abdominal history of appendectomy, cholecystectomy, and hysterectomy. Also has a history of left lower lobectomy. Patient is coming from [facility] independent living.  Her most recent BPs show she is afebrile, and normotensive with a BP of 111/75.  She is slightly bradycardiac with a HR of 57.  Labs indicate leukocytosis 15.1, hyperkalemia 5.9, hypochloremia 89, BUN 24, and Scr 1.05. Normal lipase 35. CT of abdomen showed fluid-filled dilated small bowel loops measuring up to 4.4 centimeters in diameter.  No clear transition point was identified.  No free air or free fluid. Colon decompressed. Findings consistent with distal small bowel obstruction.  Patient is being treated for hyperkalemia.  During her encounter she was in the middle of her albuterol  treatment. Patient's friend was at bedside.   Surgery is consulted by  Dr. Levander in this context for evaluation and management of distal small bowel obstruction.  PAST MEDICAL HISTORY (PMH):  Past Medical History:  Diagnosis Date   Allergy    Asthma    Cancer (HCC)    lung   Colonic polyp    GERD (gastroesophageal reflux disease)    Hyperlipidemia    Hypertension    Osteoporosis    osteoarthritis   Squamous cell carcinoma of skin    R leg, txted in past by Dr. Jakie   Squamous cell carcinoma of skin 02/28/2021   L upper forearm, [facility]   Urinary  incontinence    Vertigo    chronic     PAST SURGICAL HISTORY (PSH):  Past Surgical History:  Procedure Laterality Date   APPENDECTOMY     BLADDER REPAIR     CHOLECYSTECTOMY     esophageal hernia  01/2007   repaired x 4   EYE SURGERY     cataract extraction, bilateral   JOINT REPLACEMENT  02/2010   Left THR   JOINT REPLACEMENT  5/12   Right THR--Dr Hooten   VAGINAL HYSTERECTOMY       MEDICATIONS:  Prior to Admission medications   Medication Sig Start Date End Date Taking? Authorizing Provider  acetaminophen (TYLENOL) 500 MG tablet Take 500 mg by mouth as needed.    [provider]  Acetylcysteine (NAC PO) Take 1 capsule by mouth daily. Patient not taking: Reported on 05/23/2024    [provider]  Alpha-Lipoic Acid 300 MG TABS Take 2 tablets by mouth in the morning and at bedtime.    [provider]  aspirin EC 81 MG tablet Take 81 mg by mouth daily.    [provider]  Biotin 1 MG CAPS Take by mouth.    [provider]  BREO ELLIPTA  100-25 MCG/ACT AEPB Inhale 1 puff into the lungs daily. Patient not taking: Reported on 05/23/2024 11/14/22   Tamea Dedra CROME, MD  Calcium  Citrate-Vitamin D 315-5 MG-MCG TABS  08/12/21   [provider]  cetirizine (ZYRTEC) 10 MG tablet Take 10 mg by mouth daily.  [provider]  CREON 12000-38000 units CPEP capsule Take 2 capsules by mouth 3 (three) times daily with meals. Take 2 capsules 3 times a day with meals and 1 capsule with each snack between meals    [provider]  famotidine (PEPCID) 40 MG tablet Take 40 mg by mouth daily. Patient not taking: Reported on 05/23/2024    [provider]  ipratropium (ATROVENT) 0.06 % nasal spray Place 2 sprays into both nostrils 2 (two) times daily as needed. 10/25/23 10/24/24  [provider]  IVERMECTIN PO Take 12 mg by mouth. Patient not taking: Reported on 05/23/2024    [provider]  losartan  (COZAAR )  100 MG tablet TAKE ONE TABLET BY MOUTH EVERY DAY 04/27/15   Jimmy Charlie FERNS, MD  Multiple Vitamins-Minerals (MULTIVITAMIN WITH MINERALS) tablet Take 1 tablet by mouth daily. Patient not taking: Reported on 11/20/2023    [provider]  ondansetron  (ZOFRAN -ODT) 4 MG disintegrating tablet DISSOLVE 1 TABLET IN MOUTH EVERY 12 HOURS AS NEEDED FOR NAUSEA OR VOMITING 03/29/15   Jimmy Charlie FERNS, MD  tolterodine  (DETROL  LA) 4 MG 24 hr capsule Take 4 mg by mouth daily after supper.    [provider]  triamcinolone  (NASACORT ) 55 MCG/ACT AERO nasal inhaler Place 2 sprays into the nose daily. Patient not taking: Reported on 05/23/2024 11/14/22   Tamea Dedra CROME, MD  Turmeric (QC TUMERIC COMPLEX PO) Take 2,000 mg by mouth in the morning and at bedtime.    [provider]  vitamin C (ASCORBIC ACID) 250 MG tablet Take 500 mg by mouth daily.    [provider]  Zinc 50 MG TABS Take 1 tablet by mouth daily. Patient not taking: Reported on 05/23/2024    [provider]     ALLERGIES:  Allergies  Allergen Reactions   Promethazine Hcl     Psychotic reaction----tolerates zofran    Lactose Diarrhea   Scopolamine     Rash to pills     SOCIAL HISTORY:  Social History   Socioeconomic History   Marital status: Widowed    Spouse name: Not on file   Number of children: 4   Years of education: Not on file   Highest education level: Not on file  Occupational History   Occupation: Retired, Engineer, site: RETIRED  Tobacco Use   Smoking status: Former    Current packs/day: 0.25    Average packs/day: 0.3 packs/day for 10.0 years (2.5 ttl pk-yrs)    Types: Cigarettes   Smokeless tobacco: Never  Substance and Sexual Activity   Alcohol use: No    Alcohol/week: 0.0 standard drinks of alcohol   Drug use: No   Sexual activity: Not on file  Other Topics Concern   Not on file  Social History Narrative   Has living will   Son Elsie is health  care POA.   Has DNR already.   No feeding tube if cognitively unaware         Social Drivers of Health   Financial Resource Strain: Low Risk  (10/15/2023)   Received from [facility]   Overall Financial Resource Strain (CARDIA)    Difficulty of Paying Living Expenses: Not hard at all  Food Insecurity: No Food Insecurity (10/15/2023)   Received from [facility]   Hunger Vital Sign    Within the past 12 months, you worried that your food would run out before you got the money to buy  more.: Never true    Within the past 12 months, the food you bought just didn't last and you didn't have money to get more.: Never true  Transportation Needs: No Transportation Needs (10/15/2023)   Received from [facility] - Transportation    In the past 12 months, has lack of transportation kept you from medical appointments or from getting medications?: No    Lack of Transportation (Non-Medical): No  Physical Activity: Not on file  Stress: Not on file  Social Connections: Not on file  Intimate Partner Violence: Not on file     FAMILY HISTORY:  Family History  Problem Relation Age of Onset   Stroke Father    Pulmonary embolism Father       REVIEW OF SYSTEMS:  Review of Systems  Constitutional:  Negative for chills and fever.  Respiratory:  Positive for shortness of breath. Negative for cough.   Cardiovascular:  Negative for chest pain and palpitations.  Gastrointestinal:  Positive for abdominal pain and nausea. Negative for vomiting.    VITAL SIGNS:  Temp:  [97.9 F (36.6 C)] 97.9 F (36.6 C) (09/29 0802) Pulse Rate:  [57-86] 57 (09/29 1105) Resp:  [11-22] 11 (09/29 1105) BP: (111-127)/(60-75) 111/75 (09/29 1105) SpO2:  [96 %-100 %] 100 % (09/29 1105) Weight:  [37 kg] 37 kg (09/29 0803)     Height: 4' 11 (149.9 cm) Weight: 37 kg BMI (Calculated): 16.47   INTAKE/OUTPUT:  No intake/output data recorded.  PHYSICAL EXAM:   Physical Exam Constitutional:      Appearance: She is well-developed.  HENT:     Head: Normocephalic and atraumatic.  Cardiovascular:     Rate and Rhythm: Normal rate and regular rhythm.  Pulmonary:     Effort: Pulmonary effort is normal.     Breath sounds: Normal breath sounds.  Abdominal:     General: There is no distension.     Palpations: Abdomen is soft.     Tenderness: There is generalized abdominal tenderness.  Neurological:     Mental Status: She is alert.     Labs:     Latest Ref Rng & Units 06/09/2024    8:04 AM 03/22/2015    7:00 AM 06/11/2014    4:10 PM  CBC  WBC 4.0 - 10.5 K/uL 15.1  11.4  8.4   Hemoglobin 12.0 - 15.0 g/dL 84.4  86.1  86.4   Hematocrit 36.0 - 46.0 % 47.3  42.3  41.1   Platelets 150 - 400 K/uL 386  294  300.0       Latest Ref Rng & Units 06/09/2024    8:04 AM 10/05/2020    3:22 PM 10/14/2019   11:00 AM  CMP  Glucose 70 - 99 mg/dL 823  882    BUN 8 - 23 mg/dL 24  19    Creatinine 9.55 - 1.00 mg/dL 8.94  8.89  9.29   Sodium 135 - 145 mmol/L 135  142    Potassium 3.5 - 5.1 mmol/L 5.9  4.1    Chloride 98 - 111 mmol/L 89  103    CO2 22 - 32 mmol/L 29  28    Calcium  8.9 - 10.3 mg/dL 89.0  9.6    Total Protein 6.5 - 8.1 g/dL 8.6     Total Bilirubin 0.0 - 1.2 mg/dL 1.0     Alkaline Phos 38 - 126 U/L 70     AST 15 - 41 U/L 49  ALT 0 - 44 U/L 40       Imaging studies:  CLINICAL DATA:  Abdominal and back pain since yesterday after sitting on middle bleacher for extended period of time. History of lung cancer.   EXAM: CT ABDOMEN AND PELVIS WITH CONTRAST   TECHNIQUE: Multidetector CT imaging of the abdomen and pelvis was performed using the standard protocol following bolus administration of intravenous contrast.   RADIATION DOSE REDUCTION: This exam was performed according to the departmental dose-optimization program which includes automated exposure control, adjustment of the mA and/or kV according to patient size and/or use of  iterative reconstruction technique.   CONTRAST:  75mL OMNIPAQUE  IOHEXOL  300 MG/ML  SOLN   COMPARISON:  04/25/2024 and 10/13/2020   FINDINGS: Lower chest: Lung bases demonstrate no acute airspace process or effusion. Small calcified granuloma left lower lobe. Heart is normal size. There is calcified plaque over the descending thoracic aorta. Air-fluid level over the distal esophagus which may be due to dysmotility versus reflux.   Hepatobiliary: Liver is unremarkable without focal mass. Previous cholecystectomy. Biliary tree is unremarkable.   Pancreas: Normal.   Spleen: Normal.   Adrenals/Urinary Tract: Adrenal glands are normal. Kidneys are normal in size without hydronephrosis or nephrolithiasis. Visualized portions of the ureters and bladder are unremarkable although much of the distal ureters and bladder obscured by streak artifact from bilateral hip prostheses.   Stomach/Bowel: Moderate distention of the stomach with air and fluid. Previous appendectomy.   There are numerous fluid-filled dilated small bowel loops present measuring up to of 4.4 cm in diameter. No dilated loops could be followed distally to the right lower quadrant/right pelvis and cannot follow up beyond that point. No clear transition point is noted. No free peritoneal air or pneumatosis. Colon is decompressed. Diverticulosis of the sigmoid colon without active inflammation.   Vascular/Lymphatic: Moderate calcified plaque over the abdominal aorta which is normal caliber. SMA appears patent. No definite adenopathy.   Reproductive: Status post hysterectomy. No adnexal masses.   Other: No significant free fluid.   Musculoskeletal: Bilateral hip arthroplasties causing moderate streak artifact over the pelvis. Moderate biphasic curvature of the thoracolumbar spine. Moderate degenerative changes throughout the spine with multilevel disc disease over the lumbar spine. Grade 1 anterolisthesis of L3 on  L4.   IMPRESSION: 1. Numerous fluid-filled dilated small bowel loops measuring up to 4.4 cm in diameter. No clear transition point is noted. Findings are compatible with distal small bowel obstruction. No free peritoneal air or pneumatosis. 2. Sigmoid diverticulosis without active inflammation. 3. Aortic atherosclerosis. 4. Air-fluid level over the distal esophagus which may be due to dysmotility versus reflux.   Aortic Atherosclerosis (ICD10-I70.0).     Electronically Signed   By: Toribio Agreste M.D.   On: 06/09/2024 10:11  Assessment/Plan: 89 y.o. female with distal small bowel obstruction, complicated by pertinent comorbidities including asthma, hypertension, hyperlipidemia, GERD, and history of lung cancer.    - Will manage conservatively with NG tube and NPO. Discussed common causes of small bowel obstruction including adhesions. Patient has a history of appendectomy, cholecystectomy and hysterectomy.   - Recommend hospitalist to admit, will follow and manage SBO   - Continue pain management as needed  - DVT prophylaxis   Thank you for the opportunity to participate in this patient's care.   -- Gilmer Cea PA-C

## 2024-06-10 ENCOUNTER — Inpatient Hospital Stay

## 2024-06-10 DIAGNOSIS — L8990 Pressure ulcer of unspecified site, unspecified stage: Secondary | ICD-10-CM | POA: Insufficient documentation

## 2024-06-10 DIAGNOSIS — K56609 Unspecified intestinal obstruction, unspecified as to partial versus complete obstruction: Secondary | ICD-10-CM | POA: Diagnosis not present

## 2024-06-10 DIAGNOSIS — J4540 Moderate persistent asthma, uncomplicated: Secondary | ICD-10-CM | POA: Insufficient documentation

## 2024-06-10 LAB — CBC
HCT: 39.6 % (ref 36.0–46.0)
Hemoglobin: 13 g/dL (ref 12.0–15.0)
MCH: 30.7 pg (ref 26.0–34.0)
MCHC: 32.8 g/dL (ref 30.0–36.0)
MCV: 93.6 fL (ref 80.0–100.0)
Platelets: 327 K/uL (ref 150–400)
RBC: 4.23 MIL/uL (ref 3.87–5.11)
RDW: 13.2 % (ref 11.5–15.5)
WBC: 15 K/uL — ABNORMAL HIGH (ref 4.0–10.5)
nRBC: 0 % (ref 0.0–0.2)

## 2024-06-10 LAB — COMPREHENSIVE METABOLIC PANEL WITH GFR
ALT: 32 U/L (ref 0–44)
AST: 34 U/L (ref 15–41)
Albumin: 4 g/dL (ref 3.5–5.0)
Alkaline Phosphatase: 64 U/L (ref 38–126)
Anion gap: 11 (ref 5–15)
BUN: 30 mg/dL — ABNORMAL HIGH (ref 8–23)
CO2: 30 mmol/L (ref 22–32)
Calcium: 9.4 mg/dL (ref 8.9–10.3)
Chloride: 98 mmol/L (ref 98–111)
Creatinine, Ser: 1.04 mg/dL — ABNORMAL HIGH (ref 0.44–1.00)
GFR, Estimated: 50 mL/min — ABNORMAL LOW (ref >60)
Glucose, Bld: 170 mg/dL — ABNORMAL HIGH (ref 70–99)
Potassium: 5.2 mmol/L — ABNORMAL HIGH (ref 3.5–5.1)
Sodium: 139 mmol/L (ref 135–145)
Total Bilirubin: 0.7 mg/dL (ref 0.0–1.2)
Total Protein: 6.7 g/dL (ref 6.5–8.1)

## 2024-06-10 LAB — PROTIME-INR
INR: 1 (ref 0.8–1.2)
Prothrombin Time: 13.9 s (ref 11.4–15.2)

## 2024-06-10 LAB — GLUCOSE, CAPILLARY
Glucose-Capillary: 131 mg/dL — ABNORMAL HIGH (ref 70–99)
Glucose-Capillary: 108 mg/dL — ABNORMAL HIGH (ref 70–99)
Glucose-Capillary: 145 mg/dL — ABNORMAL HIGH (ref 70–99)
Glucose-Capillary: 155 mg/dL — ABNORMAL HIGH (ref 70–99)
Glucose-Capillary: 90 mg/dL (ref 70–99)

## 2024-06-10 LAB — POTASSIUM: Potassium: 5.5 mmol/L — ABNORMAL HIGH (ref 3.5–5.1)

## 2024-06-10 MED ORDER — SODIUM CHLORIDE 0.9 % IV SOLN: INTRAVENOUS | Status: AC

## 2024-06-10 MED ORDER — DEXTROSE 50 % IV SOLN
1.0000 | Freq: Once | INTRAVENOUS | Status: AC
  Administered 2024-06-10: 50 mL via INTRAVENOUS
  Filled 2024-06-10: qty 50

## 2024-06-10 MED ORDER — PHENOL 1.4 % MT LIQD
1.0000 | OROMUCOSAL | Status: DC | PRN
  Administered 2024-06-10: 1 via OROMUCOSAL
  Filled 2024-06-10: qty 177

## 2024-06-10 MED ORDER — INSULIN ASPART 100 UNIT/ML IV SOLN
10.0000 [IU] | Freq: Once | INTRAVENOUS | Status: AC
  Administered 2024-06-10: 10 [IU] via INTRAVENOUS
  Filled 2024-06-10: qty 0.1

## 2024-06-10 MED ORDER — LABETALOL HCL 5 MG/ML IV SOLN: 10.0000 mg | INTRAVENOUS | Status: DC | PRN

## 2024-06-10 MED ORDER — MORPHINE SULFATE (PF) 2 MG/ML IV SOLN
1.0000 mg | INTRAVENOUS | Status: DC | PRN
  Administered 2024-06-10 – 2024-06-11 (×3): 1 mg via INTRAVENOUS
  Filled 2024-06-10 (×3): qty 1

## 2024-06-10 NOTE — Progress Notes (Addendum)
 [facility]- General Surgery  SURGICAL PROGRESS NOTE  Hospital Day(s): 1.   Interval History:  Patient was frustrated this morning saying she was not feeling better with NGT. She continues to have abdominal discomfort with nausea. Denies any vomiting. She also states she is having trouble breathing, feeling pressure in her chest, She also endorses throat irritation due to NG tube. Repeat abdominal x-ray post-gastrografin showed persistent small bowel obstruction with contrast pooled in gastric fundus.   Vital signs in last 24 hours: [min-max] current  Temp:  [97.4 F (36.3 C)-98.4 F (36.9 C)] 97.8 F (36.6 C) (09/30 0826) Pulse Rate:  [57-112] 89 (09/30 0826) Resp:  [11-26] 20 (09/30 0826) BP: (110-163)/(63-89) 163/89 (09/30 0826) SpO2:  [86 %-100 %] 100 % (09/30 0826)     Height: 4' 11 (149.9 cm) Weight: 37 kg BMI (Calculated): 16.47   Intake/Output last 2 shifts:  09/29 0701 - 09/30 0700 In: 1446.1 [I.V.:1403.5; IV Piggyback:42.7] Out: 900 [Emesis/NG output:900]   Physical Exam:  Constitutional: alert, cooperative and no distress  Respiratory: breathing non-labored at rest  Cardiovascular: regular rate and sinus rhythm  Gastrointestinal: soft, tender, and non-distended  Labs:     Latest Ref Rng & Units 06/10/2024    3:34 AM 06/09/2024    8:04 AM 03/22/2015    7:00 AM  CBC  WBC 4.0 - 10.5 K/uL 15.0  15.1  11.4   Hemoglobin 12.0 - 15.0 g/dL 86.9  84.4  86.1   Hematocrit 36.0 - 46.0 % 39.6  47.3  42.3   Platelets 150 - 400 K/uL 327  386  294       Latest Ref Rng & Units 06/10/2024    3:34 AM 06/09/2024    4:15 PM 06/09/2024    8:04 AM  CMP  Glucose 70 - 99 mg/dL 829  815  823   BUN 8 - 23 mg/dL 30  27  24    Creatinine 0.44 - 1.00 mg/dL 8.95  8.70  8.94   Sodium 135 - 145 mmol/L 139  134  135   Potassium 3.5 - 5.1 mmol/L 5.2  3.6  5.9   Chloride 98 - 111 mmol/L 98  92  89   CO2 22 - 32 mmol/L 30  27  29    Calcium  8.9 - 10.3 mg/dL 9.4  9.7  89.0   Total Protein  6.5 - 8.1 g/dL 6.7   8.6   Total Bilirubin 0.0 - 1.2 mg/dL 0.7   1.0   Alkaline Phos 38 - 126 U/L 64   70   AST 15 - 41 U/L 34   49   ALT 0 - 44 U/L 32   40     Imaging studies:  CLINICAL DATA:  8 hour delay.  Small-bowel obstruction protocol.   EXAM: PORTABLE ABDOMEN - 1 VIEW   COMPARISON:  06/09/2024   FINDINGS: 0439 hours. NG tube tip is in the stomach. Contrast material pools in the gastric fundus. Opacification of proximal dilated small bowel noted in the right upper quadrant with some dilute contrast identified in dilated gas-filled small bowel of the left abdomen. No colonic contrast evident. Small bowel loops measure up to 4.8 cm diameter. Appearance is generally similar to minimally decreased since the scout film from yesterday's CT. Contrast material in the urinary bladder is compatible with excretion of intravenous contrast from CT imaging yesterday.   IMPRESSION: Persistent small bowel obstruction. Contrast material pools in the gastric fundus.     Electronically Signed  By: Camellia Candle M.D.   On: 06/10/2024 05:21   Assessment/Plan:  89 y.o. female with distal small bowel obstruction,  complicated by pertinent comorbidities including asthma, hypertension, hyperlipidemia, GERD, and history of lung cancer.    - Patient was frustrated this morning due to not feeling better and symptoms. She thought she would be feeling better by now with NG tube.   - No change on repeat abdominal x-ray from this morning. Bowel still appear dilated consistent with small bowel obstruction. This morning Dr. Cesar discussed that the surgery would consist of exploratory laparotomy. She is afraid of the overall outcome of the surgery, and won't be able to go back to her independent living. Reassessed plan with patient and friend at bedside. They would like to wait and see if symptoms improve by tomorrow morning.  - Plan to repeat abdominal x-ray tomorrow morning, if no improvement  and patient is still symptomatic, will discuss surgery again. If patient wishes to proceed with surgery, will plan for tomorrow.  - NGT having large output, continue to monitor  - Continue NGT in low-intermittent suction - Continue NPO - Continue pain medication as needed    -- Khy Pitre Barrientos PA-C

## 2024-06-10 NOTE — Plan of Care (Signed)
  Problem: Nutritional: Goal: Maintenance of adequate nutrition will improve Outcome: Progressing   Problem: Skin Integrity: Goal: Risk for impaired skin integrity will decrease Outcome: Progressing   Problem: Education: Goal: Knowledge of General Education information will improve Description: Including pain rating scale, medication(s)/side effects and non-pharmacologic comfort measures Outcome: Progressing   Problem: Safety: Goal: Ability to remain free from injury will improve Outcome: Progressing   Problem: Skin Integrity: Goal: Risk for impaired skin integrity will decrease Outcome: Progressing

## 2024-06-10 NOTE — Progress Notes (Addendum)
 PROGRESS NOTE    Jennifer Roman  FMW:[id number] DOB: 08/21/1928 DOA: 06/09/2024 PCP: Stanton Lynwood FALCON, MD  Outpatient Specialists: pulmonology    Brief Narrative:   From admission h and p  Jennifer Roman is a pleasant 89 y.o. female with medical history significant for asthma, HTN, HLD, osteoporosis, GERD who presented to ED at [facility] for abdominal pain for several days.  Patient stated that her pain is 5/10 in intensity, radiating to right mid back.  This pain started getting worse last night to the point that she was not able to sleep.  She complains some nausea but no vomiting.  She said she had a very big bowel movement yesterday and then she started feeling bloating after that.   She denies any fever, chills, dysuria, hematuria, cough, shortness of breath, palpitations.    Assessment & Plan:   Principal Problem:   SBO (small bowel obstruction) (HCC) Active Problems:   Hyperlipidemia   Essential hypertension, benign   GERD (gastroesophageal reflux disease)   Osteoporosis   LUNG CANCER, HX OF   Pressure injury of skin   Moderate persistent asthma  # SBO Abd pain and nausea, CT showing distal SBO, hx multiple abdominal surgeries so likely 2/2 adhesions. Gen surg following. Stable - continue NG tube - serial imaging - continue IVF  # Stridor Chronic problem, probably worsened by NG tube. Follows with pulm outpt - f/u pulmonology  # Asthma Patient says her stridor is symptom of asthma, I'm not so sure, no wheeze or hypoxia - outpt pulm f/u - on dupulimab currently - resume daily prednisone when taking po  # Hyperkalemia Possibly 2/2 home losartan . Improved, 5.2 today - hold losart - continue IVF - monitor UPDATE: PM K up to 5.5, will treat with insulin/glucose  # Malnutrition, chronic - consider RD consult when taking PO  # Pancreatic insufficiency - resume home creon when taking po  DVT prophylaxis: SCDs Code Status: dnr/dni Family Communication: son hunt  telephonically 9/30  Level of care: Med-Surg Status is: Inpatient Remains inpatient appropriate because: severity of illness    Consultants:  Gen surg  Procedures: NG tube placement  Antimicrobials:  none    Subjective: Reports mild abd pain and nausea, otherwise no complaints  Objective: Vitals:   06/09/24 2103 06/10/24 0032 06/10/24 0342 06/10/24 0826  BP: (!) 147/65 (!) 151/66 (!) 157/81 (!) 163/89  Pulse: (!) 105 92 91 89  Resp: 17 19 19 20   Temp: (!) 97.4 F (36.3 C) 98.4 F (36.9 C) (!) 97.4 F (36.3 C) 97.8 F (36.6 C)  TempSrc:    Oral  SpO2: 98% 98% 92% 100%  Weight:      Height:        Intake/Output Summary (Last 24 hours) at 06/10/2024 1045 Last data filed at 06/10/2024 0309 Gross per 24 hour  Intake 1446.13 ml  Output 900 ml  Net 546.13 ml   Filed Weights   06/09/24 0803  Weight: 37 kg    Examination:  General exam: Appears calm and comfortable, cachectic Respiratory system: stridor. No wheeze Cardiovascular system: S1 & S2 heard, RRR. Soft systolic mur Gastrointestinal system: Abdomen is nondistended, soft and nontender.  Central nervous system: Alert and oriented. No focal neurological deficits. Extremities: Symmetric 5 x 5 power. Decreased muscle mass Skin: No rashes, lesions or ulcers Psychiatry: Judgement and insight appear normal. Mood & affect appropriate.     Data Reviewed: I have personally reviewed following labs and imaging studies  CBC: Recent  Labs  Lab 06/09/24 0804 06/10/24 0334  WBC 15.1* 15.0*  NEUTROABS 13.1*  --   HGB 15.5* 13.0  HCT 47.3* 39.6  MCV 92.7 93.6  PLT 386 327   Basic Metabolic Panel: Recent Labs  Lab 06/09/24 0804 06/09/24 1615 06/10/24 0334  NA 135 134* 139  K 5.9* 3.6 5.2*  CL 89* 92* 98  CO2 29 27 30   GLUCOSE 176* 184* 170*  BUN 24* 27* 30*  CREATININE 1.05* 1.29* 1.04*  CALCIUM  10.9* 9.7 9.4   GFR: Estimated Creatinine Clearance: 19.7 mL/min (A) (by C-G formula based on SCr of  1.04 mg/dL (H)). Liver Function Tests: Recent Labs  Lab 06/09/24 0804 06/10/24 0334  AST 49* 34  ALT 40 32  ALKPHOS 70 64  BILITOT 1.0 0.7  PROT 8.6* 6.7  ALBUMIN 5.0 4.0   Recent Labs  Lab 06/09/24 0804  LIPASE 35   No results for input(s): AMMONIA in the last 168 hours. Coagulation Profile: Recent Labs  Lab 06/10/24 0334  INR 1.0   Cardiac Enzymes: No results for input(s): CKTOTAL, CKMB, CKMBINDEX, TROPONINI in the last 168 hours. BNP (last 3 results) No results for input(s): PROBNP in the last 8760 hours. HbA1C: Recent Labs    06/09/24 1615  HGBA1C 5.6   CBG: Recent Labs  Lab 06/09/24 1251 06/09/24 1825 06/09/24 2100 06/10/24 0827  GLUCAP 221* 144* 150* 131*   Lipid Profile: No results for input(s): CHOL, HDL, LDLCALC, TRIG, CHOLHDL, LDLDIRECT in the last 72 hours. Thyroid Function Tests: No results for input(s): TSH, T4TOTAL, FREET4, T3FREE, THYROIDAB in the last 72 hours. Anemia Panel: No results for input(s): VITAMINB12, FOLATE, FERRITIN, TIBC, IRON, RETICCTPCT in the last 72 hours. Urine analysis: No results found for: COLORURINE, APPEARANCEUR, LABSPEC, PHURINE, GLUCOSEU, HGBUR, BILIRUBINUR, KETONESUR, PROTEINUR, UROBILINOGEN, NITRITE, LEUKOCYTESUR Sepsis Labs: @LABRCNTIP (procalcitonin:4,lacticidven:4)  ) Recent Results (from the past 240 hours)  Culture, blood (Routine X 2) w Reflex to ID Panel     Status: None (Preliminary result)   Collection Time: 06/09/24  4:15 PM   Specimen: BLOOD  Result Value Ref Range Status   Specimen Description BLOOD RIGHT ANTECUBITAL  Final   Special Requests   Final    BOTTLES DRAWN AEROBIC AND ANAEROBIC Blood Culture adequate volume   Culture   Final    NO GROWTH < 24 HOURS Performed at [facility], [address] [place], [place] [address]    Report Status PENDING  Incomplete  Culture, blood (Routine X 2) w Reflex to ID Panel      Status: None (Preliminary result)   Collection Time: 06/09/24  4:23 PM   Specimen: BLOOD  Result Value Ref Range Status   Specimen Description BLOOD BLOOD RIGHT HAND  Final   Special Requests   Final    BOTTLES DRAWN AEROBIC AND ANAEROBIC Blood Culture adequate volume   Culture   Final    NO GROWTH < 24 HOURS Performed at [facility], [address] [place], [place] [address]    Report Status PENDING  Incomplete         Radiology Studies: DG Abd Portable 1V-Small Bowel Obstruction Protocol-initial, 8 hr delay Result Date: 06/10/2024 CLINICAL DATA:  8 hour delay.  Small-bowel obstruction protocol. EXAM: PORTABLE ABDOMEN - 1 VIEW COMPARISON:  06/09/2024 FINDINGS: 0439 hours. NG tube tip is in the stomach. Contrast material pools in the gastric fundus. Opacification of proximal dilated small bowel noted in the right upper quadrant with some dilute contrast identified in  dilated gas-filled small bowel of the left abdomen. No colonic contrast evident. Small bowel loops measure up to 4.8 cm diameter. Appearance is generally similar to minimally decreased since the scout film from yesterday's CT. Contrast material in the urinary bladder is compatible with excretion of intravenous contrast from CT imaging yesterday. IMPRESSION: Persistent small bowel obstruction. Contrast material pools in the gastric fundus. Electronically Signed   By: Camellia Candle M.D.   On: 06/10/2024 05:21   DG Abd 1 View Result Date: 06/09/2024 CLINICAL DATA:  [id number] Encounter for imaging study to confirm nasogastric (NG) tube placement [id number] EXAM: ABDOMEN - 1 VIEW COMPARISON:  06/09/2024 CT of the abdomen and pelvis FINDINGS: Well-positioned esophagogastric tube terminating in the gastric fundus. Mild gaseous distension of a few segments of small bowel again noted in the upper abdomen.No pneumoperitoneum. No organomegaly or radiopaque calculi. No lobar consolidation or pleural effusion. IMPRESSION: Well-positioned  esophagogastric tube terminating in the gastric fundus. Electronically Signed   By: Rogelia Myers M.D.   On: 06/09/2024 16:52   CT ABDOMEN PELVIS W CONTRAST Result Date: 06/09/2024 CLINICAL DATA:  Abdominal and back pain since yesterday after sitting on middle bleacher for extended period of time. History of lung cancer. EXAM: CT ABDOMEN AND PELVIS WITH CONTRAST TECHNIQUE: Multidetector CT imaging of the abdomen and pelvis was performed using the standard protocol following bolus administration of intravenous contrast. RADIATION DOSE REDUCTION: This exam was performed according to the departmental dose-optimization program which includes automated exposure control, adjustment of the mA and/or kV according to patient size and/or use of iterative reconstruction technique. CONTRAST:  75mL OMNIPAQUE  IOHEXOL  300 MG/ML  SOLN COMPARISON:  04/25/2024 and 10/13/2020 FINDINGS: Lower chest: Lung bases demonstrate no acute airspace process or effusion. Small calcified granuloma left lower lobe. Heart is normal size. There is calcified plaque over the descending thoracic aorta. Air-fluid level over the distal esophagus which may be due to dysmotility versus reflux. Hepatobiliary: Liver is unremarkable without focal mass. Previous cholecystectomy. Biliary tree is unremarkable. Pancreas: Normal. Spleen: Normal. Adrenals/Urinary Tract: Adrenal glands are normal. Kidneys are normal in size without hydronephrosis or nephrolithiasis. Visualized portions of the ureters and bladder are unremarkable although much of the distal ureters and bladder obscured by streak artifact from bilateral hip prostheses. Stomach/Bowel: Moderate distention of the stomach with air and fluid. Previous appendectomy. There are numerous fluid-filled dilated small bowel loops present measuring up to of 4.4 cm in diameter. No dilated loops could be followed distally to the right lower quadrant/right pelvis and cannot follow up beyond that point. No clear  transition point is noted. No free peritoneal air or pneumatosis. Colon is decompressed. Diverticulosis of the sigmoid colon without active inflammation. Vascular/Lymphatic: Moderate calcified plaque over the abdominal aorta which is normal caliber. SMA appears patent. No definite adenopathy. Reproductive: Status post hysterectomy. No adnexal masses. Other: No significant free fluid. Musculoskeletal: Bilateral hip arthroplasties causing moderate streak artifact over the pelvis. Moderate biphasic curvature of the thoracolumbar spine. Moderate degenerative changes throughout the spine with multilevel disc disease over the lumbar spine. Grade 1 anterolisthesis of L3 on L4. IMPRESSION: 1. Numerous fluid-filled dilated small bowel loops measuring up to 4.4 cm in diameter. No clear transition point is noted. Findings are compatible with distal small bowel obstruction. No free peritoneal air or pneumatosis. 2. Sigmoid diverticulosis without active inflammation. 3. Aortic atherosclerosis. 4. Air-fluid level over the distal esophagus which may be due to dysmotility versus reflux. Aortic Atherosclerosis (ICD10-I70.0). Electronically Signed   By: Toribio Agreste  M.D.   On: 06/09/2024 10:11   DG Hip Unilat W or Wo Pelvis 2-3 Views Right Result Date: 06/09/2024 CLINICAL DATA:  Right hip pain. EXAM: DG HIP (WITH OR WITHOUT PELVIS) 2-3V RIGHT COMPARISON:  04/25/2024. FINDINGS: Diffuse osseous demineralization. Status post bilateral total hip arthroplasties with normal alignment. No acute fracture or dislocation. Dilated gas-filled loops of small bowel are noted in the central abdomen. IMPRESSION: 1. No acute osseous abnormality. 2. Dilated gas-filled loops of small bowel are noted in the central abdomen. Obstruction cannot be excluded. Electronically Signed   By: Harrietta Sherry M.D.   On: 06/09/2024 09:09   DG Chest 2 View Result Date: 06/09/2024 CLINICAL DATA:  Shortness of breath. EXAM: CHEST - 2 VIEW COMPARISON:  CT chest  dated 02/27/2024. FINDINGS: The heart size and mediastinal contours are within normal limits. Aortic atherosclerosis. No pulmonary edema, focal consolidation, pleural effusion, or pneumothorax. Moderate gaseous distention of the stomach. No acute osseous abnormality. Scoliotic curvature of the spine with multilevel degenerative changes. Advanced arthritic changes of the right glenohumeral joint. IMPRESSION: 1. No acute cardiopulmonary findings. 2. Moderate gaseous distention of the stomach. 3. Aortic Atherosclerosis (ICD10-I70.0). Electronically Signed   By: Harrietta Sherry M.D.   On: 06/09/2024 09:06        Scheduled Meds:  enoxaparin (LOVENOX) injection  30 mg Subcutaneous Q24H   insulin aspart  0-5 Units Subcutaneous QHS   insulin aspart  0-6 Units Subcutaneous TID WC   labetalol  10 mg Intravenous Once   lidocaine  1 Application Other Once   sodium polystyrene  30 g Rectal Once   Continuous Infusions:  sodium chloride 100 mL/hr at 06/10/24 1009     LOS: 1 day     Devaughn KATHEE Ban, MD [facility]   If 7PM-7AM, please contact night-coverage [email or web address] Password [email or web address] 06/10/2024, 10:45 AM

## 2024-06-11 ENCOUNTER — Inpatient Hospital Stay

## 2024-06-11 DIAGNOSIS — K56609 Unspecified intestinal obstruction, unspecified as to partial versus complete obstruction: Secondary | ICD-10-CM

## 2024-06-11 DIAGNOSIS — I10 Essential (primary) hypertension: Secondary | ICD-10-CM

## 2024-06-11 DIAGNOSIS — E785 Hyperlipidemia, unspecified: Secondary | ICD-10-CM

## 2024-06-11 LAB — BLOOD GAS, ARTERIAL
Acid-base deficit: 2.3 mmol/L — ABNORMAL HIGH (ref 0.0–2.0)
Bicarbonate: 32.6 mmol/L — ABNORMAL HIGH (ref 20.0–28.0)
O2 Content: 2 L/min
O2 Saturation: 89.7 %
Patient temperature: 37
pCO2 arterial: >123 mmHg (ref 32–48)
pH, Arterial: 7.01 — CL (ref 7.35–7.45)
pO2, Arterial: 80 mmHg — ABNORMAL LOW (ref 83–108)

## 2024-06-11 LAB — COMPREHENSIVE METABOLIC PANEL WITH GFR
ALT: 27 U/L (ref 0–44)
AST: 34 U/L (ref 15–41)
Albumin: 3.8 g/dL (ref 3.5–5.0)
Alkaline Phosphatase: 53 U/L (ref 38–126)
Anion gap: 13 (ref 5–15)
BUN: 29 mg/dL — ABNORMAL HIGH (ref 8–23)
CO2: 27 mmol/L (ref 22–32)
Calcium: 9.1 mg/dL (ref 8.9–10.3)
Chloride: 103 mmol/L (ref 98–111)
Creatinine, Ser: 0.81 mg/dL (ref 0.44–1.00)
GFR, Estimated: >60 mL/min (ref >60)
Glucose, Bld: 145 mg/dL — ABNORMAL HIGH (ref 70–99)
Potassium: 4.6 mmol/L (ref 3.5–5.1)
Sodium: 143 mmol/L (ref 135–145)
Total Bilirubin: 0.8 mg/dL (ref 0.0–1.2)
Total Protein: 6.5 g/dL (ref 6.5–8.1)

## 2024-06-11 LAB — CBC
HCT: 42.4 % (ref 36.0–46.0)
Hemoglobin: 13.5 g/dL (ref 12.0–15.0)
MCH: 30.8 pg (ref 26.0–34.0)
MCHC: 31.8 g/dL (ref 30.0–36.0)
MCV: 96.8 fL (ref 80.0–100.0)
Platelets: 319 K/uL (ref 150–400)
RBC: 4.38 MIL/uL (ref 3.87–5.11)
RDW: 13.6 % (ref 11.5–15.5)
WBC: 22.1 K/uL — ABNORMAL HIGH (ref 4.0–10.5)
nRBC: 0 % (ref 0.0–0.2)

## 2024-06-11 LAB — GLUCOSE, CAPILLARY: Glucose-Capillary: 144 mg/dL — ABNORMAL HIGH (ref 70–99)

## 2024-06-11 LAB — LACTIC ACID, PLASMA: Lactic Acid, Venous: 0.9 mmol/L (ref 0.5–1.9)

## 2024-06-11 MED ORDER — BIOTENE DRY MOUTH MT LIQD: 15.0000 mL | OROMUCOSAL | Status: DC | PRN

## 2024-06-11 MED ORDER — MORPHINE SULFATE (PF) 2 MG/ML IV SOLN
1.0000 mg | INTRAVENOUS | Status: DC | PRN
  Administered 2024-06-11 – 2024-06-12 (×3): 1 mg via INTRAVENOUS
  Filled 2024-06-11 (×4): qty 1

## 2024-06-11 MED ORDER — NALOXONE HCL 0.4 MG/ML IJ SOLN
INTRAMUSCULAR | Status: AC
  Administered 2024-06-11: 0.4 mg via INTRAVENOUS
  Filled 2024-06-11: qty 1

## 2024-06-11 MED ORDER — GLYCOPYRROLATE 0.2 MG/ML IJ SOLN: 0.2000 mg | INTRAMUSCULAR | Status: DC | PRN

## 2024-06-11 MED ORDER — HALOPERIDOL 0.5 MG PO TABS: 0.5000 mg | ORAL_TABLET | ORAL | Status: DC | PRN

## 2024-06-11 MED ORDER — LORAZEPAM 1 MG PO TABS: 1.0000 mg | ORAL_TABLET | ORAL | Status: DC | PRN

## 2024-06-11 MED ORDER — GLYCOPYRROLATE 0.2 MG/ML IJ SOLN
0.2000 mg | INTRAMUSCULAR | Status: DC | PRN
  Administered 2024-06-11: 0.2 mg via INTRAVENOUS
  Filled 2024-06-11: qty 1

## 2024-06-11 MED ORDER — HALOPERIDOL LACTATE 5 MG/ML IJ SOLN: 0.5000 mg | INTRAMUSCULAR | Status: DC | PRN

## 2024-06-11 MED ORDER — POLYVINYL ALCOHOL 1.4 % OP SOLN: 1.0000 [drp] | Freq: Four times a day (QID) | OPHTHALMIC | Status: DC | PRN

## 2024-06-11 MED ORDER — LORAZEPAM 2 MG/ML PO CONC: 1.0000 mg | ORAL | Status: DC | PRN

## 2024-06-11 MED ORDER — GLYCOPYRROLATE 1 MG PO TABS: 1.0000 mg | ORAL_TABLET | ORAL | Status: DC | PRN

## 2024-06-11 MED ORDER — NALOXONE HCL 0.4 MG/ML IJ SOLN: 0.4000 mg | INTRAMUSCULAR | Status: DC | PRN

## 2024-06-11 MED ORDER — HALOPERIDOL LACTATE 2 MG/ML PO CONC: 0.5000 mg | ORAL | Status: DC | PRN

## 2024-06-11 MED ORDER — LORAZEPAM 2 MG/ML IJ SOLN
1.0000 mg | INTRAMUSCULAR | Status: DC | PRN
  Administered 2024-06-11: 1 mg via INTRAVENOUS
  Filled 2024-06-11: qty 1

## 2024-06-11 NOTE — Plan of Care (Signed)
  Problem: Pain Managment: Goal: General experience of comfort will improve and/or be controlled Outcome: Progressing   Problem: Safety: Goal: Ability to remain free from injury will improve Outcome: Progressing   Problem: Skin Integrity: Goal: Risk for impaired skin integrity will decrease Outcome: Progressing

## 2024-06-11 NOTE — Progress Notes (Signed)
 PT Cancellation Note  Patient Details Name: Jennifer Roman MRN: [id number] DOB: 11-30-1927   Cancelled Treatment:    Reason Eval/Treat Not Completed: Other (comment) Pt transition to comfort care. PT sign off   [name] 06/11/2024, 3:59 PM

## 2024-06-11 NOTE — Progress Notes (Signed)
 [facility]- General Surgery  SURGICAL PROGRESS NOTE  Hospital Day(s): 2.   Interval History:  Overnight patient was found unresponsive with agonal breathing. Patient was in acute metabolic encephalopathic and acute respiratory failure. Son was notified and agreed to comfort care measures. Repeat abdominal x-ray did not show any improvement with small bowel obstruction. This morning patient was asleep and labored breathing.    Vital signs in last 24 hours: [min-max] current  Temp:  [98 F (36.7 C)-98.5 F (36.9 C)] 98 F (36.7 C) (09/30 2040) Pulse Rate:  [97-106] 97 (10/01 0732) Resp:  [17-40] 17 (10/01 0732) BP: (99-167)/(49-69) 99/49 (10/01 0732) SpO2:  [90 %-98 %] 93 % (10/01 0732)     Height: 4' 11 (149.9 cm) Weight: 37 kg BMI (Calculated): 16.47   Intake/Output last 2 shifts:  09/30 0701 - 10/01 0700 In: 1163.4 [I.V.:1163.4] Out: 900 [Emesis/NG output:900]   Physical Exam:  Constitutional:comfortable, asleep  Respiratory: breathing labored at rest  Cardiovascular: regular rate and sinus rhythm  Gastrointestinal: soft, non-tender, and non-distended  Labs:     Latest Ref Rng & Units 06/11/2024    3:22 AM 06/10/2024    3:34 AM 06/09/2024    8:04 AM  CBC  WBC 4.0 - 10.5 K/uL 22.1  15.0  15.1   Hemoglobin 12.0 - 15.0 g/dL 86.4  86.9  84.4   Hematocrit 36.0 - 46.0 % 42.4  39.6  47.3   Platelets 150 - 400 K/uL 319  327  386       Latest Ref Rng & Units 06/11/2024    3:22 AM 06/10/2024    2:57 PM 06/10/2024    3:34 AM  CMP  Glucose 70 - 99 mg/dL 854   829   BUN 8 - 23 mg/dL 29   30   Creatinine 9.55 - 1.00 mg/dL 9.18   8.95   Sodium 864 - 145 mmol/L 143   139   Potassium 3.5 - 5.1 mmol/L 4.6  5.5  5.2   Chloride 98 - 111 mmol/L 103   98   CO2 22 - 32 mmol/L 27   30   Calcium  8.9 - 10.3 mg/dL 9.1   9.4   Total Protein 6.5 - 8.1 g/dL 6.5   6.7   Total Bilirubin 0.0 - 1.2 mg/dL 0.8   0.7   Alkaline Phos 38 - 126 U/L 53   64   AST 15 - 41 U/L 34   34   ALT 0 - 44  U/L 27   32     Imaging studies:  CLINICAL DATA:  Small bowel obstruction.   EXAM: ABDOMEN - 1 VIEW   COMPARISON:  06/10/2024   FINDINGS: Small volume residual contrast noted in the stomach. NG tube tip remains in the stomach. Gas dilated small bowel loop seen on the previous study are less prominent although there is persistent small bowel dilatation, notably in the right lower quadrant measuring up to 4.1 cm diameter. No definite: Contrast on the provided image.   IMPRESSION: Persistent small bowel dilatation, notably in the right lower quadrant. No definite colonic contrast on the provided image.     Electronically Signed   By: Camellia Candle M.D.   On: 06/11/2024 05:04  Assessment/Plan:  89 y.o. female with distal small bowel obstruction, complicated by pertinent comorbidities including asthma, hypertension, hyperlipidemia, GERD, and history of lung cancer.    - Since there was no improvement on abdominal x-ray this morning, next step would have been to  consider surgery. However, family would like to continue comfort care measures since clinically deteriorating last night. In this case, there will be no surgical intervention.  - Discussed removing NG tube for comfort with risk of aspiration. However, friend and daughter-in law at bedside agreed to keep NGT for now. May consider removing it if patient starts showing discomfort.    - Continue comfort measures  - Continue to follow hospitalist recommendations  -- Jontavia Leatherbury Barrientos PA-C

## 2024-06-11 NOTE — Progress Notes (Signed)
   06/11/24 9662  Spiritual Encounters  Type of Visit Initial  Care provided to: Patient;Friend;Family (Friend at bedside; family came to the room later)  [place] partners present during encounter Physician;Nurse;Other (comment) (Rapid Response Team)  Referral source Code page  Reason for visit Code  OnCall Visit Yes  Spiritual Framework  Presenting Themes Meaning/purpose/sources of inspiration;Goals in life/care;Values and beliefs;Impactful experiences and emotions;Other (comment) (for Family and Friend)  Family Stress Factors Other (Comment) (Per family -Pt's son is supposed to have a heart procedure 10/1; Wife is on cancer treatment)  Interventions  Spiritual Care Interventions Made Established relationship of care and support;Compassionate presence;Reflective listening;Normalization of emotions;Decision-making support/facilitation;Prayer;Supported grief process  Intervention Outcomes  Outcomes Connection to spiritual care;Connection to values and goals of care;Awareness of support;Reduced anxiety;Other (comment) (for Family and Friend)   Family decided to move Pt to comfort measures

## 2024-06-11 NOTE — Progress Notes (Signed)
 PROGRESS NOTE    Jennifer Roman  FMW:[id number] DOB: [birth year] DOA: 06/09/2024 PCP: Stanton Lynwood FALCON, MD    Brief Narrative:   From admission h and p   Jennifer Roman is a pleasant 89 y.o. female with medical history significant for asthma, HTN, HLD, osteoporosis, GERD who presented to ED at [facility] for abdominal pain for several days.  Patient stated that her pain is 5/10 in intensity, radiating to right mid back.  This pain started getting worse last night to the point that she was not able to sleep.  She complains some nausea but no vomiting.  She said she had a very big bowel movement yesterday and then she started feeling bloating after that.   She denies any fever, chills, dysuria, hematuria, cough, shortness of breath, palpitations.  10/1: Patient suffered an acute decompensation early this a.m.  Evaluated by overnight MD.  After discussion with the family and considering advanced age, poor prognosis, deteriorating clinical status decision was made to discontinue aggressive care and proceed with full comfort measures.  NGT left in place.   Assessment & Plan:   Principal Problem:   SBO (small bowel obstruction) (HCC) Active Problems:   Hyperlipidemia   Essential hypertension, benign   GERD (gastroesophageal reflux disease)   Osteoporosis   LUNG CANCER, HX OF   Pressure injury of skin   Moderate persistent asthma   SBO Patient now for comfort measures.  Will continue NGT as it does not appear to be causing discomfort and may prevent further aspiration and abdominal pain.  Low threshold to remove NGT should patient exhibit any overt signs of discomfort or distress.  Severe respiratory acidosis Unresponsive Rapid response called early a.m. 10/1.  Decision made after discussion with family to proceed with full comfort measures.  All medications not focused on patient comfort have been discontinued.  Ensure pain control.  Morphine  IV as needed for air hunger.  Anticipated  hospital demise.  Discussed with family at bedside.   DVT prophylaxis: None/comfort Code Status: DNR comfort Family Communication: 2 family members at bedside 10/1 Disposition Plan: Status is: Inpatient Remains inpatient appropriate because: Full comfort measures.  Anticipated hospital demise.   Level of care: Med-Surg  Consultants:  General surgery  Procedures:  NGT placement  Antimicrobials: None   Subjective: Seen and examined.  Unresponsive.-Members at bedside.  Objective: Vitals:   06/10/24 2120 06/11/24 0400 06/11/24 0411 06/11/24 0732  BP:  (!) 167/67 (!) 140/69 (!) 99/49  Pulse:    97  Resp:   (!) 40 17  Temp:      TempSrc:      SpO2: 96% 98% 96% 93%  Weight:      Height:        Intake/Output Summary (Last 24 hours) at 06/11/2024 1459 Last data filed at 06/11/2024 1355 Gross per 24 hour  Intake 1163.36 ml  Output --  Net 1163.36 ml   Filed Weights   06/09/24 0803  Weight: 37 kg    Examination:  General exam: Ill-appearing, frail Respiratory system: Coarse breath sounds bilaterally Cardiovascular system: S1-S2, RRR, no murmurs, no pedal edema Gastrointestinal system: Thin, mild distention, hypoactive bowel sounds Central nervous system: Unresponsive.  Unable to assess orientation Extremities: Markedly decreased power Skin: No rashes, lesions or ulcers Psychiatry: Unable to assess    Data Reviewed: I have personally reviewed following labs and imaging studies  CBC: Recent Labs  Lab 06/09/24 0804 06/10/24 0334 06/11/24 0322  WBC 15.1* 15.0* 22.1*  NEUTROABS  13.1*  --   --   HGB 15.5* 13.0 13.5  HCT 47.3* 39.6 42.4  MCV 92.7 93.6 96.8  PLT 386 327 319   Basic Metabolic Panel: Recent Labs  Lab 06/09/24 0804 06/09/24 1615 06/10/24 0334 06/10/24 1457 06/11/24 0322  NA 135 134* 139  --  143  K 5.9* 3.6 5.2* 5.5* 4.6  CL 89* 92* 98  --  103  CO2 29 27 30   --  27  GLUCOSE 176* 184* 170*  --  145*  BUN 24* 27* 30*  --  29*   CREATININE 1.05* 1.29* 1.04*  --  0.81  CALCIUM  10.9* 9.7 9.4  --  9.1   GFR: Estimated Creatinine Clearance: 25.3 mL/min (by C-G formula based on SCr of 0.81 mg/dL). Liver Function Tests: Recent Labs  Lab 06/09/24 0804 06/10/24 0334 06/11/24 0322  AST 49* 34 34  ALT 40 32 27  ALKPHOS 70 64 53  BILITOT 1.0 0.7 0.8  PROT 8.6* 6.7 6.5  ALBUMIN 5.0 4.0 3.8   Recent Labs  Lab 06/09/24 0804  LIPASE 35   No results for input(s): AMMONIA in the last 168 hours. Coagulation Profile: Recent Labs  Lab 06/10/24 0334  INR 1.0   Cardiac Enzymes: No results for input(s): CKTOTAL, CKMB, CKMBINDEX, TROPONINI in the last 168 hours. BNP (last 3 results) No results for input(s): PROBNP in the last 8760 hours. HbA1C: Recent Labs    06/09/24 1615  HGBA1C 5.6   CBG: Recent Labs  Lab 06/10/24 1258 06/10/24 1703 06/10/24 1746 06/10/24 2130 06/11/24 0335  GLUCAP 108* 145* 155* 90 144*   Lipid Profile: No results for input(s): CHOL, HDL, LDLCALC, TRIG, CHOLHDL, LDLDIRECT in the last 72 hours. Thyroid Function Tests: No results for input(s): TSH, T4TOTAL, FREET4, T3FREE, THYROIDAB in the last 72 hours. Anemia Panel: No results for input(s): VITAMINB12, FOLATE, FERRITIN, TIBC, IRON, RETICCTPCT in the last 72 hours. Sepsis Labs: Recent Labs  Lab 06/11/24 0402  LATICACIDVEN 0.9    Recent Results (from the past 240 hours)  Culture, blood (Routine X 2) w Reflex to ID Panel     Status: None (Preliminary result)   Collection Time: 06/09/24  4:15 PM   Specimen: BLOOD  Result Value Ref Range Status   Specimen Description BLOOD RIGHT ANTECUBITAL  Final   Special Requests   Final    BOTTLES DRAWN AEROBIC AND ANAEROBIC Blood Culture adequate volume   Culture   Final    NO GROWTH 2 DAYS Performed at [facility], [address] [place], [place] [address]    Report Status PENDING  Incomplete  Culture, blood (Routine X 2) w  Reflex to ID Panel     Status: None (Preliminary result)   Collection Time: 06/09/24  4:23 PM   Specimen: BLOOD  Result Value Ref Range Status   Specimen Description BLOOD BLOOD RIGHT HAND  Final   Special Requests   Final    BOTTLES DRAWN AEROBIC AND ANAEROBIC Blood Culture adequate volume   Culture   Final    NO GROWTH 2 DAYS Performed at [facility], [address] [place], [place] [address]    Report Status PENDING  Incomplete         Radiology Studies: DG Abd 1 View Result Date: 06/11/2024 CLINICAL DATA:  Small bowel obstruction. EXAM: ABDOMEN - 1 VIEW COMPARISON:  06/10/2024 FINDINGS: Small volume residual contrast noted in the stomach. NG tube tip remains in the stomach. Gas dilated small bowel  loop seen on the previous study are less prominent although there is persistent small bowel dilatation, notably in the right lower quadrant measuring up to 4.1 cm diameter. No definite: Contrast on the provided image. IMPRESSION: Persistent small bowel dilatation, notably in the right lower quadrant. No definite colonic contrast on the provided image. Electronically Signed   By: Camellia Candle M.D.   On: 06/11/2024 05:04   DG Chest Port 1 View Result Date: 06/11/2024 CLINICAL DATA:  89 year old female with respiratory distress. EXAM: PORTABLE CHEST 1 VIEW COMPARISON:  Chest radiographs 06/09/2024 and earlier. FINDINGS: Portable AP upright view at 0403 hours. Enteric tube now in place, terminates in the left upper quadrant and there is a small volume of oral contrast within the stomach. Stomach also less gaseous distended since 06/09/2024. Mildly lower lung volumes and patchy new left lung base opacity. Stable cardiac size and mediastinal contours. Calcified aortic atherosclerosis. No pneumothorax or pulmonary edema. Right lung base appears stable, no convincing pleural effusion. Stable visualized osseous structures. IMPRESSION: 1. Patchy acute left lung base opacity. Consider  aspiration or pneumonia. No pleural effusion is evident. 2. No other acute cardiopulmonary abnormality. Enteric tube placed into the stomach, small volume of oral contrast in the proximal stomach. Electronically Signed   By: VEAR Hurst M.D.   On: 06/11/2024 04:44   DG Abd 2 Views Result Date: 06/10/2024 CLINICAL DATA:  [id number] Small bowel obstruction (HCC) [id number]. EXAM: ABDOMEN - 2 VIEW COMPARISON:  Radiograph from earlier the same day at 4:39 a.m. FINDINGS: Redemonstration of gaseous dilatation of small bowel loops, which is disproportionate to the degree of distention of the colon, suggesting small bowel obstruction. Redemonstration of positive oral contrast in the proximal small bowel loops. There is interval dilution of the contrast when compared to the prior exam. However, no discrete positive contrast noted within the colon. There is persistent contrast in the stomach, suggesting gastric stasis. No evidence of pneumoperitoneum. No acute osseous abnormalities. The soft tissues are within normal limits. Surgical changes, devices, tubes and lines: Enteric tube seen with its tip and side hole overlying the left upper quadrant, overlying the proximal stomach region. IMPRESSION: Redemonstration of findings suggestive of small bowel obstruction. There is interval dilution of the contrast in the proximal small bowel loops. No discrete positive contrast noted within the colon. There is persistent contrast in the stomach, suggesting gastric stasis. Electronically Signed   By: Ree Molt M.D.   On: 06/10/2024 14:57   DG Abd Portable 1V-Small Bowel Obstruction Protocol-initial, 8 hr delay Result Date: 06/10/2024 CLINICAL DATA:  8 hour delay.  Small-bowel obstruction protocol. EXAM: PORTABLE ABDOMEN - 1 VIEW COMPARISON:  06/09/2024 FINDINGS: 0439 hours. NG tube tip is in the stomach. Contrast material pools in the gastric fundus. Opacification of proximal dilated small bowel noted in the right upper quadrant with  some dilute contrast identified in dilated gas-filled small bowel of the left abdomen. No colonic contrast evident. Small bowel loops measure up to 4.8 cm diameter. Appearance is generally similar to minimally decreased since the scout film from yesterday's CT. Contrast material in the urinary bladder is compatible with excretion of intravenous contrast from CT imaging yesterday. IMPRESSION: Persistent small bowel obstruction. Contrast material pools in the gastric fundus. Electronically Signed   By: Camellia Candle M.D.   On: 06/10/2024 05:21   DG Abd 1 View Result Date: 06/09/2024 CLINICAL DATA:  [id number] Encounter for imaging study to confirm nasogastric (NG) tube placement [id number] EXAM: ABDOMEN - 1 VIEW COMPARISON:  06/09/2024 CT of the abdomen and pelvis FINDINGS: Well-positioned esophagogastric tube terminating in the gastric fundus. Mild gaseous distension of a few segments of small bowel again noted in the upper abdomen.No pneumoperitoneum. No organomegaly or radiopaque calculi. No lobar consolidation or pleural effusion. IMPRESSION: Well-positioned esophagogastric tube terminating in the gastric fundus. Electronically Signed   By: Rogelia Myers M.D.   On: 06/09/2024 16:52        Scheduled Meds:  lidocaine  1 Application Other Once   Continuous Infusions:  sodium chloride 100 mL/hr at 06/10/24 2150     LOS: 2 days    [name], MD [facility]   If 7PM-7AM, please contact night-coverage  06/11/2024, 2:59 PM

## 2024-06-11 NOTE — Significant Event (Signed)
 Rapid Response Event Note   Reason for Call :  AMS and SOB  Initial Focused Assessment:  Patient rapidly breathing with Stridor and unresponsive. Patients Vitals on Arrival to rapid BP 190/75 HR 129 RR 50 O2 100% on 2L. Dr Cleatus at bedside. CXR Ordered, Labs and EKG. Narcan Given twice with no response. Patient Began to desat to 13 so Patient was placed on Non-rebeather mask while Dr Cleatus was on the phone with Son discussing Goals of care 02 increased to 96%. Son Decided to make patient comfort care.      MD Notified: Dr Cleatus Call Upfz:[id number] Arrival Time:0340 End Upfz:[id number]  Lesley LOISE Shams, RN

## 2024-06-11 NOTE — Progress Notes (Signed)
       CROSS COVER NOTE  Significant event: Overhead rapid response   NAME: Jennifer Roman MRN: [id number] DOB : [birth year]    Concern as stated by nurse / staff   Patient found unresponsive with agonal breathing. Morphine  1 mg and Zofran  administered about an hour prior     Pertinent findings on chart review: 89 year old female Admitted for SBO being managed conservatively with NG tube.  Was treated earlier for hyperkalemia of 5.5 with insulin and glucose  Patient Assessment Upon my arrival, rapid response team at bedside Patient unresponsive with stridorous agonal breathing, GCS of 3 and not responding to painful stimuli. -Initial vitals within normal limits -EKG with sinus tachycardia, nonischemic no peaking of T waves -Narcan 0.4 mg x 2 administered without effect -ABG 7.2/120/80 -Stat chest x-ray personally interpreted not suggestive of aspiration -Abdominal x-ray done -Bicarb push 50 mg without effect -After several attempts got son on the phone who states he is on his way and was agreeable to comfort care -Comfort care orders placed Physical Exam Constitutional:      General: She is in acute distress.     Comments: Unresponsive  Cardiovascular:     Rate and Rhythm: Tachycardia present.  Pulmonary:     Effort: Tachypnea present.     Breath sounds: Normal breath sounds. Stridor present.  Neurological:     GCS: GCS eye subscore is 1. GCS verbal subscore is 1. GCS motor subscore is 1.    Date/Time Temp ECG Heart Rate Resp BP SpO2 O2 Device O2 Flow Rate (L/min)  06/11/24 0411 -- -- 40 Abnormal  140/69 Abnormal  96 % Non-rebreather Mask --  06/11/24 0400 -- 126 Abnormal  -- 167/67 Abnormal  98 % Nasal Cannula 2 L/min  06/10/24 2120 -- -- -- -- 96 % Nasal Cannula 2 L/min  06/10/24 2040 98 F (36.7 C) -- -- 144/60 Abnormal  -- -- --   ABG    Component Value Date/Time   PHART 7.01 (LL) 06/11/2024 0400   PCO2ART >123 (HH) 06/11/2024 0400   PO2ART 80 (L) 06/11/2024  0400   HCO3 32.6 (H) 06/11/2024 0400   ACIDBASEDEF 2.3 (H) 06/11/2024 0400   O2SAT 89.7 06/11/2024 0400     Assessment and  Interventions   Assessment:  Acute metabolic encephalopathy--unresponsiveness Acute respiratory failure with hypercapnia and hypoxia   Plan: Management delivered as outlined previously Patient will be placed comfort care, discussion with son on phone X      CRITICAL CARE Performed by: Delayne LULLA Solian   Total critical care time: 45 minutes  Critical care time was exclusive of separately billable procedures and treating other patients.  Critical care was necessary to treat or prevent imminent or life-threatening deterioration.  Critical care was time spent personally by me on the following activities: development of treatment plan with patient and/or surrogate as well as nursing, discussions with consultants, evaluation of patient's response to treatment, examination of patient, obtaining history from patient or surrogate, ordering and performing treatments and interventions, ordering and review of laboratory studies, ordering and review of radiographic studies, pulse oximetry and re-evaluation of patient's condition.

## 2024-06-11 NOTE — Progress Notes (Signed)
 Family called nurse to room due to concerns in the change of appearence of the NG tube drainage. I withdrew a few mLs of drainage to inspect. Drainage has turned more brown with coffee ground appearence, rusty smell. Only a few ccs that remain in the tubing. Family requested to at least notify MD of the change. Patient appears to be resting comfortably, unresponsive. Respirations even does not appear to be in any pain at this time.

## 2024-06-11 NOTE — Plan of Care (Signed)

## 2024-06-11 NOTE — Progress Notes (Signed)
   06/11/24 0630  Spiritual Encounters  Type of Visit Follow up  Care provided to: Patient;Family;Friend  Conversation partners present during Programmer, systems  Referral source Chaplain assessment  Reason for visit End-of-life  OnCall Visit Yes  Spiritual Framework  Presenting Themes Meaning/purpose/sources of inspiration;Goals in life/care  Interventions  Spiritual Care Interventions Made Compassionate presence;Reflective listening;Narrative/life review;Supported grief process  Intervention Outcomes  Outcomes Connection to spiritual care;Awareness around self/spiritual resourses;Awareness of support;Reduced anxiety

## 2024-06-11 DEATH — deceased

## 2024-06-12 DIAGNOSIS — K56609 Unspecified intestinal obstruction, unspecified as to partial versus complete obstruction: Secondary | ICD-10-CM | POA: Diagnosis not present

## 2024-06-12 MED ORDER — MORPHINE 100MG IN NS 100ML (1MG/ML) PREMIX INFUSION
5.0000 mg/h | INTRAVENOUS | Status: DC
  Administered 2024-06-12: 1 mg/h via INTRAVENOUS
  Filled 2024-06-12: qty 100

## 2024-06-12 NOTE — Plan of Care (Signed)
  Problem: Coping: Goal: Level of anxiety will decrease Outcome: Progressing   Problem: Pain Managment: Goal: General experience of comfort will improve and/or be controlled Outcome: Progressing   Problem: Role Relationship: Goal: Family's ability to cope with current situation will improve Outcome: Progressing   Problem: Pain Management: Goal: Satisfaction with pain management regimen will improve Outcome: Progressing

## 2024-06-12 NOTE — Care Management Important Message (Signed)
 Important Message  Patient Details  Name: Jennifer Roman MRN: [id number] Date of Birth: [birth year]   Important Message Given:  Yes - Medicare IM     Marysue Fait W, CMA 06/12/2024, 11:18 AM

## 2024-06-12 NOTE — TOC Initial Note (Signed)
 Transition of Care [facility]) - Initial/Assessment Note    Patient Details  Name: Jennifer Roman MRN: [id number] Date of Birth: 09-Jul-1928  Transition of Care [facility]) CM/SW Contact:    Dalia GORMAN Fuse, RN Phone Number: 06/12/2024, 3:00 PM  Clinical Narrative:                 TOC met with the patient's son in the room. He advised the patient is now Comfort Care. Morphine  gtt has been initiated. TOC advised to reachout if there are any questions or TOC needs.        Patient Goals and CMS Choice            Expected Discharge Plan and Services                                              Prior Living Arrangements/Services                       Activities of Daily Living   ADL Screening (condition at time of admission) Independently performs ADLs?: Yes (appropriate for developmental age) Is the patient deaf or have difficulty hearing?: Yes Does the patient have difficulty seeing, even when wearing glasses/contacts?: No Does the patient have difficulty concentrating, remembering, or making decisions?: No  Permission Sought/Granted                  Emotional Assessment              Admission diagnosis:  Hyperkalemia [E87.5] Small bowel obstruction (HCC) [K56.609] SBO (small bowel obstruction) (HCC) [K56.609] Patient Active Problem List   Diagnosis Date Noted   Pressure injury of skin 06/10/2024   Moderate persistent asthma 06/10/2024   SBO (small bowel obstruction) (HCC) 06/09/2024   Localized, primary osteoarthritis of shoulder region 08/16/2021   Hypertension 10/17/2019   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   Carotid stenosis 10/16/2017   Right flank pain 03/25/2015   Ovarian mass, right 03/25/2015   Advanced directives, counseling/discussion 06/11/2014   Routine general medical examination at a health care facility 06/09/2013   Sleep disturbance 06/30/2010   Osteoarthritis 03/10/2010   Hyperlipidemia 02/18/2010   Essential hypertension,  benign 02/18/2010   Allergic rhinitis 02/18/2010   Mild intermittent asthma in adult without complication 02/18/2010   GERD (gastroesophageal reflux disease) 02/18/2010   Osteoporosis 02/18/2010   Urge urinary incontinence 02/18/2010   LUNG CANCER, HX OF 02/18/2010   History of colonic polyps 02/18/2010   Asthma in adult 02/18/2010   PCP:  Stanton Lynwood JULIANNA, MD Pharmacy:   [facility] - [place], [place] - [address] [place] [address] Phone: [phone] Fax: [phone]     Social Drivers of Health (SDOH) Social History: SDOH Screenings   Food Insecurity: No Food Insecurity (06/09/2024)  Housing: Low Risk  (06/09/2024)  Transportation Needs: No Transportation Needs (06/09/2024)  Utilities: Not At Risk (06/09/2024)  Financial Resource Strain: Low Risk  (10/15/2023)   Received from [facility]  Social Connections: Moderately Isolated (06/09/2024)  Tobacco Use: Medium Risk (06/09/2024)   SDOH Interventions:     Readmission Risk Interventions     No data to display

## 2024-06-12 NOTE — Progress Notes (Signed)
 PROGRESS NOTE    Jennifer Roman  FMW:[id number] DOB: August 19, 1928 DOA: 06/09/2024 PCP: Stanton Lynwood FALCON, MD    Brief Narrative:   From admission h and p   Jennifer Roman is a pleasant 89 y.o. female with medical history significant for asthma, HTN, HLD, osteoporosis, GERD who presented to ED at [facility] for abdominal pain for several days.  Patient stated that her pain is 5/10 in intensity, radiating to right mid back.  This pain started getting worse last night to the point that she was not able to sleep.  She complains some nausea but no vomiting.  She said she had a very big bowel movement yesterday and then she started feeling bloating after that.   She denies any fever, chills, dysuria, hematuria, cough, shortness of breath, palpitations.  10/1: Patient suffered an acute decompensation early this a.m.  Evaluated by overnight MD.  After discussion with the family and considering advanced age, poor prognosis, deteriorating clinical status decision was made to discontinue aggressive care and proceed with full comfort measures.  NGT left in place.   Assessment & Plan:   Principal Problem:   SBO (small bowel obstruction) (HCC) Active Problems:   Hyperlipidemia   Essential hypertension, benign   GERD (gastroesophageal reflux disease)   Osteoporosis   LUNG CANCER, HX OF   Pressure injury of skin   Moderate persistent asthma    Severe respiratory acidosis Unresponsive Rapid response called early a.m. 10/1.  Decision made after discussion with family to proceed with full comfort measures.  All medications not focused on patient comfort have been discontinued.  Ensure pain control.  Initiate morphine  gtt.  Today uptitrate to patient comfort.  Discussed with son at bedside.  SBO Patient now for comfort measures.  Will continue NGT as it does not appear to be causing discomfort and may prevent further aspiration and abdominal pain.  Low threshold to remove NGT should patient exhibit any  overt signs of discomfort or distress.   DVT prophylaxis: None/comfort Code Status: DNR comfort Family Communication: 2 family members at bedside 10/1, son at bedside 10/2 Disposition Plan: Status is: Inpatient Remains inpatient appropriate because: Full comfort measures.  Anticipated hospital demise.   Level of care: Med-Surg  Consultants:  General surgery  Procedures:  NGT placement  Antimicrobials: None   Subjective: Seen and examined.  Patient remains unresponsive.  Family members at bedside.  Objective: Vitals:   06/11/24 0732 06/11/24 2013 06/12/24 0838 06/12/24 0850  BP: (!) 99/49 (!) 106/41 (!) 105/43   Pulse: 97 91 97   Resp: 17 20 (!) 32 (!) 35  Temp:   97.6 F (36.4 C)   TempSrc:   Axillary   SpO2: 93%  99%   Weight:      Height:        Intake/Output Summary (Last 24 hours) at 06/12/2024 1324 Last data filed at 06/11/2024 1355 Gross per 24 hour  Intake 0 ml  Output --  Net 0 ml   Filed Weights   06/09/24 0803  Weight: 37 kg    Examination:  General exam: Ill-appearing Respiratory system: Coarse breath sounds bilaterally.  Normal work of breathing Cardiovascular system: S1-S2, RRR, no murmurs, no pedal edema Gastrointestinal system: Thin, mild distention, hypoactive bowel sounds Central nervous system: Unresponsive.  Unable to assess orientation Extremities: Markedly decreased power Skin: No rashes, lesions or ulcers Psychiatry: Unable to assess    Data Reviewed: I have personally reviewed following labs and imaging studies  CBC: Recent  Labs  Lab 06/09/24 0804 06/10/24 0334 06/11/24 0322  WBC 15.1* 15.0* 22.1*  NEUTROABS 13.1*  --   --   HGB 15.5* 13.0 13.5  HCT 47.3* 39.6 42.4  MCV 92.7 93.6 96.8  PLT 386 327 319   Basic Metabolic Panel: Recent Labs  Lab 06/09/24 0804 06/09/24 1615 06/10/24 0334 06/10/24 1457 06/11/24 0322  NA 135 134* 139  --  143  K 5.9* 3.6 5.2* 5.5* 4.6  CL 89* 92* 98  --  103  CO2 29 27 30   --  27   GLUCOSE 176* 184* 170*  --  145*  BUN 24* 27* 30*  --  29*  CREATININE 1.05* 1.29* 1.04*  --  0.81  CALCIUM  10.9* 9.7 9.4  --  9.1   GFR: Estimated Creatinine Clearance: 25.3 mL/min (by C-G formula based on SCr of 0.81 mg/dL). Liver Function Tests: Recent Labs  Lab 06/09/24 0804 06/10/24 0334 06/11/24 0322  AST 49* 34 34  ALT 40 32 27  ALKPHOS 70 64 53  BILITOT 1.0 0.7 0.8  PROT 8.6* 6.7 6.5  ALBUMIN 5.0 4.0 3.8   Recent Labs  Lab 06/09/24 0804  LIPASE 35   No results for input(s): AMMONIA in the last 168 hours. Coagulation Profile: Recent Labs  Lab 06/10/24 0334  INR 1.0   Cardiac Enzymes: No results for input(s): CKTOTAL, CKMB, CKMBINDEX, TROPONINI in the last 168 hours. BNP (last 3 results) No results for input(s): PROBNP in the last 8760 hours. HbA1C: Recent Labs    06/09/24 1615  HGBA1C 5.6   CBG: Recent Labs  Lab 06/10/24 1258 06/10/24 1703 06/10/24 1746 06/10/24 2130 06/11/24 0335  GLUCAP 108* 145* 155* 90 144*   Lipid Profile: No results for input(s): CHOL, HDL, LDLCALC, TRIG, CHOLHDL, LDLDIRECT in the last 72 hours. Thyroid Function Tests: No results for input(s): TSH, T4TOTAL, FREET4, T3FREE, THYROIDAB in the last 72 hours. Anemia Panel: No results for input(s): VITAMINB12, FOLATE, FERRITIN, TIBC, IRON, RETICCTPCT in the last 72 hours. Sepsis Labs: Recent Labs  Lab 06/11/24 0402  LATICACIDVEN 0.9    Recent Results (from the past 240 hours)  Culture, blood (Routine X 2) w Reflex to ID Panel     Status: None (Preliminary result)   Collection Time: 06/09/24  4:15 PM   Specimen: BLOOD  Result Value Ref Range Status   Specimen Description BLOOD RIGHT ANTECUBITAL  Final   Special Requests   Final    BOTTLES DRAWN AEROBIC AND ANAEROBIC Blood Culture adequate volume   Culture   Final    NO GROWTH 3 DAYS Performed at [facility], [address] [place], [place] [address]    Report  Status PENDING  Incomplete  Culture, blood (Routine X 2) w Reflex to ID Panel     Status: None (Preliminary result)   Collection Time: 06/09/24  4:23 PM   Specimen: BLOOD  Result Value Ref Range Status   Specimen Description BLOOD BLOOD RIGHT HAND  Final   Special Requests   Final    BOTTLES DRAWN AEROBIC AND ANAEROBIC Blood Culture adequate volume   Culture   Final    NO GROWTH 3 DAYS Performed at [facility], [address] [place], [place] [address]    Report Status PENDING  Incomplete         Radiology Studies: DG Abd 1 View Result Date: 06/11/2024 CLINICAL DATA:  Small bowel obstruction. EXAM: ABDOMEN - 1 VIEW COMPARISON:  06/10/2024 FINDINGS: Small volume residual  contrast noted in the stomach. NG tube tip remains in the stomach. Gas dilated small bowel loop seen on the previous study are less prominent although there is persistent small bowel dilatation, notably in the right lower quadrant measuring up to 4.1 cm diameter. No definite: Contrast on the provided image. IMPRESSION: Persistent small bowel dilatation, notably in the right lower quadrant. No definite colonic contrast on the provided image. Electronically Signed   By: Camellia Candle M.D.   On: 06/11/2024 05:04   DG Chest Port 1 View Result Date: 06/11/2024 CLINICAL DATA:  89 year old female with respiratory distress. EXAM: PORTABLE CHEST 1 VIEW COMPARISON:  Chest radiographs 06/09/2024 and earlier. FINDINGS: Portable AP upright view at 0403 hours. Enteric tube now in place, terminates in the left upper quadrant and there is a small volume of oral contrast within the stomach. Stomach also less gaseous distended since 06/09/2024. Mildly lower lung volumes and patchy new left lung base opacity. Stable cardiac size and mediastinal contours. Calcified aortic atherosclerosis. No pneumothorax or pulmonary edema. Right lung base appears stable, no convincing pleural effusion. Stable visualized osseous structures.  IMPRESSION: 1. Patchy acute left lung base opacity. Consider aspiration or pneumonia. No pleural effusion is evident. 2. No other acute cardiopulmonary abnormality. Enteric tube placed into the stomach, small volume of oral contrast in the proximal stomach. Electronically Signed   By: VEAR Hurst M.D.   On: 06/11/2024 04:44        Scheduled Meds:  lidocaine  1 Application Other Once   Continuous Infusions:  morphine        LOS: 3 days    [name], MD [facility]   If 7PM-7AM, please contact night-coverage  06/12/2024, 1:24 PM

## 2024-06-14 LAB — CULTURE, BLOOD (ROUTINE X 2)
Culture: NO GROWTH
Culture: NO GROWTH
Special Requests: ADEQUATE
Special Requests: ADEQUATE

## 2024-07-12 NOTE — Plan of Care (Signed)

## 2024-07-12 NOTE — Significant Event (Signed)
       CROSS COVER NOTE  NAME: Jennifer Roman MRN: [id number] DOB : Mar 25, 1928 ATTENDING PHYSICIAN: Jhonny Calvin NOVAK, MD    Notified by RN that patient is deceased as of 52.  Patient was DNR/DNI and comfort care.  2 RN verified.  Family notified by nursing staff.   This document was prepared using Dragon voice recognition software and may include unintentional dictation errors.  Rockie Rams DNP, MBA, FNP-BC Nurse Practitioner [facility] Pager [phone]

## 2024-07-12 NOTE — Death Summary Note (Signed)
  DEATH SUMMARY   Patient Details  Name: Jennifer Roman MRN: [id number] DOB: [birth year]  Admission/Discharge Information   Admit Date:  06/24/24  Date of Death: Date of Death: 2024/06/28  Time of Death: Time of Death: 0240  Length of Stay: 4  Code Status:   Referring Physician: Stanton Lynwood JULIANNA, MD    Reason(s) for Hospitalization  SBO  Diagnoses  Preliminary cause of death: Severe metabolic and respiratory acidosis.  Likely aspiration event Secondary Diagnoses (including complications and co-morbidities):  Principal Problem:   SBO (small bowel obstruction) (HCC) Active Problems:   Hyperlipidemia   Essential hypertension, benign   GERD (gastroesophageal reflux disease)   Osteoporosis   LUNG CANCER, HX OF   Pressure injury of skin   Moderate persistent asthma    Brief Hospital Course   [name] is a pleasant 89 y.o. female with medical history significant for asthma, HTN, HLD, osteoporosis, GERD who presented to ED at [facility] for abdominal pain for several days.  Patient stated that her pain is 5/10 in intensity, radiating to right mid back.  This pain started getting worse last night to the point that she was not able to sleep.  She complains some nausea but no vomiting.  She said she had a very big bowel movement yesterday and then she started feeling bloating after that.   She denies any fever, chills, dysuria, hematuria, cough, shortness of breath, palpitations.   10/1: Patient suffered an acute decompensation early this a.m.  Evaluated by overnight MD.  After discussion with the family and considering advanced age, poor prognosis, deteriorating clinical status decision was made to discontinue aggressive care and proceed with full comfort measures.  NGT left in place.  10/3: Patient was placed on comfort measures and on morphine  gtt.  Per documentation patient found to be deceased as of 2:40 AM on 06/28/2024.  Patient was DNR/DNI and on comfort measures.  To RN verified  patient demise.  Family notified by nursing staff.  Condolences provided.  No notes on file    [name] Jun 28, 2024, 3:18 PM

## 2024-07-12 DEATH — deceased

## 2024-11-21 ENCOUNTER — Encounter (INDEPENDENT_AMBULATORY_CARE_PROVIDER_SITE_OTHER)

## 2024-11-21 ENCOUNTER — Ambulatory Visit (INDEPENDENT_AMBULATORY_CARE_PROVIDER_SITE_OTHER): Admitting: Vascular Surgery
